# Patient Record
Sex: Female | Born: 1955 | Race: Black or African American | Hispanic: No | Marital: Single | Smoking: Former smoker
Health system: Southern US, Community
[De-identification: ages and names within clinical notes are randomized; demographics above are authoritative.]

## PROBLEM LIST (undated history)

## (undated) DIAGNOSIS — Z940 Kidney transplant status: Secondary | ICD-10-CM

## (undated) DIAGNOSIS — I739 Peripheral vascular disease, unspecified: Secondary | ICD-10-CM

## (undated) DIAGNOSIS — M1990 Unspecified osteoarthritis, unspecified site: Secondary | ICD-10-CM

## (undated) DIAGNOSIS — E785 Hyperlipidemia, unspecified: Secondary | ICD-10-CM

## (undated) DIAGNOSIS — I10 Essential (primary) hypertension: Secondary | ICD-10-CM

## (undated) DIAGNOSIS — K219 Gastro-esophageal reflux disease without esophagitis: Secondary | ICD-10-CM

## (undated) DIAGNOSIS — G40909 Epilepsy, unspecified, not intractable, without status epilepticus: Secondary | ICD-10-CM

## (undated) DIAGNOSIS — R569 Unspecified convulsions: Secondary | ICD-10-CM

## (undated) DIAGNOSIS — I639 Cerebral infarction, unspecified: Secondary | ICD-10-CM

## (undated) DIAGNOSIS — D631 Anemia in chronic kidney disease: Secondary | ICD-10-CM

## (undated) DIAGNOSIS — N189 Chronic kidney disease, unspecified: Secondary | ICD-10-CM

## (undated) HISTORY — DX: Anemia in chronic kidney disease: D63.1

## (undated) HISTORY — PX: CATHETER REMOVAL: SHX911

## (undated) HISTORY — DX: Chronic kidney disease, unspecified: N18.9

## (undated) HISTORY — DX: Epilepsy, unspecified, not intractable, without status epilepticus: G40.909

## (undated) HISTORY — DX: Hyperlipidemia, unspecified: E78.5

## (undated) HISTORY — DX: Unspecified convulsions: R56.9

## (undated) HISTORY — DX: Peripheral vascular disease, unspecified: I73.9

## (undated) HISTORY — PX: COLONOSCOPY: SHX174

## (undated) HISTORY — PX: GALLBLADDER SURGERY: SHX652

## (undated) HISTORY — PX: KIDNEY TRANSPLANT: SHX239

## (undated) HISTORY — DX: Unspecified osteoarthritis, unspecified site: M19.90

## (undated) HISTORY — DX: Gastro-esophageal reflux disease without esophagitis: K21.9

---

## 1997-09-27 ENCOUNTER — Ambulatory Visit (HOSPITAL_COMMUNITY): Admission: RE | Admit: 1997-09-27 | Discharge: 1997-09-27 | Payer: Self-pay | Admitting: Internal Medicine

## 1997-10-14 ENCOUNTER — Ambulatory Visit (HOSPITAL_COMMUNITY): Admission: RE | Admit: 1997-10-14 | Discharge: 1997-10-14 | Payer: Self-pay | Admitting: Internal Medicine

## 1997-10-25 ENCOUNTER — Ambulatory Visit (HOSPITAL_COMMUNITY): Admission: RE | Admit: 1997-10-25 | Discharge: 1997-10-25 | Payer: Self-pay | Admitting: Internal Medicine

## 1998-01-31 ENCOUNTER — Ambulatory Visit (HOSPITAL_COMMUNITY): Admission: RE | Admit: 1998-01-31 | Discharge: 1998-02-01 | Payer: Self-pay | Admitting: General Surgery

## 1998-02-16 ENCOUNTER — Encounter (HOSPITAL_COMMUNITY): Admission: RE | Admit: 1998-02-16 | Discharge: 1998-05-17 | Payer: Self-pay | Admitting: Nephrology

## 1998-02-23 ENCOUNTER — Ambulatory Visit (HOSPITAL_COMMUNITY): Admission: RE | Admit: 1998-02-23 | Discharge: 1998-02-23 | Payer: Self-pay | Admitting: Internal Medicine

## 1998-02-23 ENCOUNTER — Encounter: Payer: Self-pay | Admitting: Internal Medicine

## 1998-06-17 ENCOUNTER — Encounter (HOSPITAL_COMMUNITY): Admission: RE | Admit: 1998-06-17 | Discharge: 1998-09-15 | Payer: Self-pay | Admitting: Nephrology

## 1998-09-28 ENCOUNTER — Encounter (HOSPITAL_COMMUNITY): Admission: RE | Admit: 1998-09-28 | Discharge: 1998-12-27 | Payer: Self-pay | Admitting: Nephrology

## 1998-12-30 ENCOUNTER — Encounter (HOSPITAL_COMMUNITY): Admission: RE | Admit: 1998-12-30 | Discharge: 1999-03-30 | Payer: Self-pay | Admitting: Nephrology

## 1999-05-04 ENCOUNTER — Encounter (HOSPITAL_COMMUNITY): Admission: RE | Admit: 1999-05-04 | Discharge: 1999-08-02 | Payer: Self-pay | Admitting: Nephrology

## 1999-07-31 ENCOUNTER — Encounter (HOSPITAL_COMMUNITY): Admission: RE | Admit: 1999-07-31 | Discharge: 1999-10-29 | Payer: Self-pay | Admitting: Nephrology

## 1999-10-26 ENCOUNTER — Encounter (HOSPITAL_COMMUNITY): Admission: RE | Admit: 1999-10-26 | Discharge: 2000-01-24 | Payer: Self-pay | Admitting: Nephrology

## 1999-11-09 ENCOUNTER — Encounter: Admission: RE | Admit: 1999-11-09 | Discharge: 1999-11-09 | Payer: Self-pay | Admitting: Internal Medicine

## 1999-11-09 ENCOUNTER — Encounter: Payer: Self-pay | Admitting: Internal Medicine

## 2000-01-23 ENCOUNTER — Encounter (HOSPITAL_COMMUNITY): Admission: RE | Admit: 2000-01-23 | Discharge: 2000-04-22 | Payer: Self-pay | Admitting: Nephrology

## 2000-04-26 ENCOUNTER — Emergency Department (HOSPITAL_COMMUNITY): Admission: EM | Admit: 2000-04-26 | Discharge: 2000-04-26 | Payer: Self-pay | Admitting: Emergency Medicine

## 2000-04-29 ENCOUNTER — Encounter (HOSPITAL_COMMUNITY): Admission: RE | Admit: 2000-04-29 | Discharge: 2000-07-28 | Payer: Self-pay | Admitting: Nephrology

## 2000-08-08 ENCOUNTER — Encounter (HOSPITAL_COMMUNITY): Admission: RE | Admit: 2000-08-08 | Discharge: 2000-11-06 | Payer: Self-pay | Admitting: Nephrology

## 2000-11-29 ENCOUNTER — Encounter (HOSPITAL_COMMUNITY): Admission: RE | Admit: 2000-11-29 | Discharge: 2001-02-27 | Payer: Self-pay | Admitting: Nephrology

## 2001-01-13 ENCOUNTER — Encounter: Payer: Self-pay | Admitting: Internal Medicine

## 2001-01-13 ENCOUNTER — Encounter: Admission: RE | Admit: 2001-01-13 | Discharge: 2001-01-13 | Payer: Self-pay | Admitting: Internal Medicine

## 2001-01-15 ENCOUNTER — Encounter: Admission: RE | Admit: 2001-01-15 | Discharge: 2001-01-15 | Payer: Self-pay | Admitting: Internal Medicine

## 2001-01-15 ENCOUNTER — Encounter: Payer: Self-pay | Admitting: Internal Medicine

## 2001-03-07 ENCOUNTER — Encounter (HOSPITAL_COMMUNITY): Admission: RE | Admit: 2001-03-07 | Discharge: 2001-06-05 | Payer: Self-pay | Admitting: Nephrology

## 2001-06-10 ENCOUNTER — Encounter (HOSPITAL_COMMUNITY): Admission: RE | Admit: 2001-06-10 | Discharge: 2001-09-08 | Payer: Self-pay | Admitting: Nephrology

## 2001-09-16 ENCOUNTER — Encounter (HOSPITAL_COMMUNITY): Admission: RE | Admit: 2001-09-16 | Discharge: 2001-12-15 | Payer: Self-pay | Admitting: Nephrology

## 2001-12-19 ENCOUNTER — Encounter (HOSPITAL_COMMUNITY): Admission: RE | Admit: 2001-12-19 | Discharge: 2002-03-19 | Payer: Self-pay | Admitting: Nephrology

## 2001-12-29 ENCOUNTER — Ambulatory Visit (HOSPITAL_COMMUNITY): Admission: RE | Admit: 2001-12-29 | Discharge: 2001-12-29 | Payer: Self-pay | Admitting: General Surgery

## 2001-12-29 ENCOUNTER — Encounter: Payer: Self-pay | Admitting: General Surgery

## 2002-01-01 ENCOUNTER — Ambulatory Visit (HOSPITAL_COMMUNITY): Admission: RE | Admit: 2002-01-01 | Discharge: 2002-01-02 | Payer: Self-pay | Admitting: General Surgery

## 2002-01-01 ENCOUNTER — Encounter: Payer: Self-pay | Admitting: General Surgery

## 2002-08-06 ENCOUNTER — Encounter: Payer: Self-pay | Admitting: Internal Medicine

## 2002-08-06 ENCOUNTER — Encounter: Admission: RE | Admit: 2002-08-06 | Discharge: 2002-08-06 | Payer: Self-pay | Admitting: Internal Medicine

## 2003-11-30 ENCOUNTER — Encounter: Admission: RE | Admit: 2003-11-30 | Discharge: 2003-11-30 | Payer: Self-pay | Admitting: Internal Medicine

## 2004-12-06 ENCOUNTER — Encounter: Admission: RE | Admit: 2004-12-06 | Discharge: 2004-12-06 | Payer: Self-pay | Admitting: Internal Medicine

## 2005-07-02 ENCOUNTER — Inpatient Hospital Stay (HOSPITAL_COMMUNITY): Admission: AD | Admit: 2005-07-02 | Discharge: 2005-07-05 | Payer: Self-pay | Admitting: Nephrology

## 2005-07-25 ENCOUNTER — Encounter (HOSPITAL_COMMUNITY): Admission: RE | Admit: 2005-07-25 | Discharge: 2005-10-23 | Payer: Self-pay | Admitting: Family Medicine

## 2005-11-13 ENCOUNTER — Encounter (HOSPITAL_COMMUNITY): Admission: RE | Admit: 2005-11-13 | Discharge: 2005-12-19 | Payer: Self-pay | Admitting: Nephrology

## 2006-01-01 ENCOUNTER — Ambulatory Visit (HOSPITAL_COMMUNITY): Admission: RE | Admit: 2006-01-01 | Discharge: 2006-01-01 | Payer: Self-pay | Admitting: General Surgery

## 2006-01-08 ENCOUNTER — Encounter (HOSPITAL_COMMUNITY): Admission: RE | Admit: 2006-01-08 | Discharge: 2006-04-08 | Payer: Self-pay | Admitting: Nephrology

## 2006-01-16 ENCOUNTER — Encounter: Admission: RE | Admit: 2006-01-16 | Discharge: 2006-01-16 | Payer: Self-pay | Admitting: Internal Medicine

## 2006-04-19 ENCOUNTER — Encounter: Admission: RE | Admit: 2006-04-19 | Discharge: 2006-04-19 | Payer: Self-pay | Admitting: Internal Medicine

## 2006-10-27 ENCOUNTER — Emergency Department (HOSPITAL_COMMUNITY): Admission: EM | Admit: 2006-10-27 | Discharge: 2006-10-27 | Payer: Self-pay | Admitting: Emergency Medicine

## 2006-11-10 ENCOUNTER — Inpatient Hospital Stay (HOSPITAL_COMMUNITY): Admission: EM | Admit: 2006-11-10 | Discharge: 2006-11-14 | Payer: Self-pay | Admitting: Emergency Medicine

## 2006-11-29 ENCOUNTER — Encounter (INDEPENDENT_AMBULATORY_CARE_PROVIDER_SITE_OTHER): Payer: Self-pay | Admitting: Nephrology

## 2006-11-29 ENCOUNTER — Ambulatory Visit: Payer: Self-pay | Admitting: Cardiovascular Disease

## 2006-11-29 ENCOUNTER — Inpatient Hospital Stay (HOSPITAL_COMMUNITY): Admission: EM | Admit: 2006-11-29 | Discharge: 2006-12-02 | Payer: Self-pay | Admitting: Emergency Medicine

## 2007-01-08 ENCOUNTER — Ambulatory Visit: Payer: Self-pay | Admitting: Vascular Surgery

## 2007-01-24 ENCOUNTER — Ambulatory Visit (HOSPITAL_COMMUNITY): Admission: RE | Admit: 2007-01-24 | Discharge: 2007-01-24 | Payer: Self-pay | Admitting: Vascular Surgery

## 2007-01-24 ENCOUNTER — Ambulatory Visit: Payer: Self-pay | Admitting: Vascular Surgery

## 2007-02-06 ENCOUNTER — Encounter: Admission: RE | Admit: 2007-02-06 | Discharge: 2007-02-06 | Payer: Self-pay | Admitting: Internal Medicine

## 2007-03-04 ENCOUNTER — Ambulatory Visit: Payer: Self-pay | Admitting: Vascular Surgery

## 2007-05-23 ENCOUNTER — Ambulatory Visit: Payer: Self-pay | Admitting: Vascular Surgery

## 2007-05-23 ENCOUNTER — Emergency Department (HOSPITAL_COMMUNITY): Admission: EM | Admit: 2007-05-23 | Discharge: 2007-05-23 | Payer: Self-pay | Admitting: Emergency Medicine

## 2007-06-03 ENCOUNTER — Ambulatory Visit (HOSPITAL_COMMUNITY): Admission: RE | Admit: 2007-06-03 | Discharge: 2007-06-03 | Payer: Self-pay | Admitting: Nephrology

## 2007-06-26 ENCOUNTER — Ambulatory Visit (HOSPITAL_COMMUNITY): Admission: RE | Admit: 2007-06-26 | Discharge: 2007-06-26 | Payer: Self-pay | Admitting: Nephrology

## 2008-02-10 ENCOUNTER — Encounter: Admission: RE | Admit: 2008-02-10 | Discharge: 2008-02-10 | Payer: Self-pay | Admitting: Internal Medicine

## 2008-03-11 ENCOUNTER — Ambulatory Visit (HOSPITAL_COMMUNITY): Admission: RE | Admit: 2008-03-11 | Discharge: 2008-03-11 | Payer: Self-pay | Admitting: Nephrology

## 2008-12-03 ENCOUNTER — Emergency Department (HOSPITAL_COMMUNITY): Admission: EM | Admit: 2008-12-03 | Discharge: 2008-12-03 | Payer: Self-pay | Admitting: Emergency Medicine

## 2008-12-15 ENCOUNTER — Encounter (INDEPENDENT_AMBULATORY_CARE_PROVIDER_SITE_OTHER): Payer: Self-pay | Admitting: Internal Medicine

## 2008-12-15 ENCOUNTER — Ambulatory Visit (HOSPITAL_COMMUNITY): Admission: RE | Admit: 2008-12-15 | Discharge: 2008-12-15 | Payer: Self-pay | Admitting: Internal Medicine

## 2008-12-15 ENCOUNTER — Ambulatory Visit: Payer: Self-pay | Admitting: Vascular Surgery

## 2008-12-29 ENCOUNTER — Ambulatory Visit: Admission: RE | Admit: 2008-12-29 | Discharge: 2008-12-29 | Payer: Self-pay | Admitting: Unknown Physician Specialty

## 2008-12-29 ENCOUNTER — Encounter (INDEPENDENT_AMBULATORY_CARE_PROVIDER_SITE_OTHER): Payer: Self-pay | Admitting: Sports Medicine

## 2009-02-10 ENCOUNTER — Encounter: Admission: RE | Admit: 2009-02-10 | Discharge: 2009-02-10 | Payer: Self-pay | Admitting: Internal Medicine

## 2009-02-15 ENCOUNTER — Encounter: Admission: RE | Admit: 2009-02-15 | Discharge: 2009-02-15 | Payer: Self-pay | Admitting: Nephrology

## 2009-08-26 ENCOUNTER — Encounter (HOSPITAL_COMMUNITY): Admission: RE | Admit: 2009-08-26 | Discharge: 2009-11-09 | Payer: Self-pay | Admitting: Nephrology

## 2010-02-28 ENCOUNTER — Encounter: Admission: RE | Admit: 2010-02-28 | Discharge: 2010-02-28 | Payer: Self-pay | Admitting: Internal Medicine

## 2010-06-19 LAB — POCT HEMOGLOBIN-HEMACUE: Hemoglobin: 11.8 g/dL — ABNORMAL LOW (ref 12.0–15.0)

## 2010-06-19 LAB — TACROLIMUS LEVEL: Tacrolimus (FK506) - LabCorp: 19.3 ng/mL

## 2010-06-19 LAB — CBC
MCHC: 33.1 g/dL (ref 30.0–36.0)
RBC: 4.02 MIL/uL (ref 3.87–5.11)
WBC: 4.6 10*3/uL (ref 4.0–10.5)

## 2010-06-19 LAB — DIFFERENTIAL
Basophils Relative: 0 % (ref 0–1)
Eosinophils Absolute: 0 10*3/uL (ref 0.0–0.7)
Lymphs Abs: 0.2 10*3/uL — ABNORMAL LOW (ref 0.7–4.0)
Monocytes Absolute: 0.6 10*3/uL (ref 0.1–1.0)
Monocytes Relative: 12 % (ref 3–12)
Neutrophils Relative %: 82 % — ABNORMAL HIGH (ref 43–77)

## 2010-07-07 LAB — POCT I-STAT, CHEM 8
BUN: 5 mg/dL — ABNORMAL LOW (ref 6–23)
Chloride: 96 mEq/L (ref 96–112)
HCT: 39 % (ref 36.0–46.0)
Potassium: 3.5 mEq/L (ref 3.5–5.1)

## 2010-07-07 LAB — DIFFERENTIAL
Basophils Relative: 0 % (ref 0–1)
Lymphocytes Relative: 13 % (ref 12–46)
Lymphs Abs: 0.8 10*3/uL (ref 0.7–4.0)
Monocytes Relative: 6 % (ref 3–12)
Neutro Abs: 5 10*3/uL (ref 1.7–7.7)

## 2010-07-07 LAB — CBC
HCT: 34.8 % — ABNORMAL LOW (ref 36.0–46.0)
Hemoglobin: 11.6 g/dL — ABNORMAL LOW (ref 12.0–15.0)
MCV: 110.5 fL — ABNORMAL HIGH (ref 78.0–100.0)
RDW: 28.5 % — ABNORMAL HIGH (ref 11.5–15.5)

## 2010-07-07 LAB — GLUCOSE, CAPILLARY: Glucose-Capillary: 93 mg/dL (ref 70–99)

## 2010-07-07 LAB — PROTIME-INR: INR: 1.2 (ref 0.00–1.49)

## 2010-07-07 LAB — APTT: aPTT: 26 seconds (ref 24–37)

## 2010-08-15 NOTE — Consult Note (Signed)
Karen [name], Karen [name]             ACCOUNT NO.:  [id number]   MEDICAL RECORD NO.:  [id number]          PATIENT TYPE:  INP   LOCATION:  5528                         FACILITY:  [facility]   PHYSICIAN:  Fenton Malling. Lucia Gaskins, M.D.  DATE OF BIRTH:  1956/03/31   DATE OF CONSULTATION:  DATE OF DISCHARGE:                                 CONSULTATION   REASON FOR CONSULTATION:  Infected peritoneal dialysis catheter.   HISTORY OF ILLNESS:  Ms. Arduini is a 55 year old black female who is a  patient of the renal service and has been followed by Dr. Jonnie Finner this  weekend, who had end-stage renal disease first diagnosed in 2003.  She  then had a kidney transplant at [facility]  in 2005.  The kidney failed in 2007.  And, in October 2007, Dr. Milana Kidney  placed a peritoneal dialysis catheter which she has done pretty well  with.  However, she developed an infection with her catheter identified as Gram  negative organisms.  She has been treated for over 2 weeks with  antibiotics and now it is felt that she would be best served by having  this peritoneal dialysis catheter removed.   PAST MEDICAL HISTORY:  Significant that she has:  1. Gout.  2. Chronic anemia.  3. Hypertension.  4. Secondary hyperparathyroidism.   CURRENT MEDICATIONS:  Mentioned on admission including:  1. Septra-DS 1/2 tablet daily.  2. Toprol XL 50 mg daily.  3. Lisinopril 10 mg daily.  4. Prednisone 2 mg daily.  5. Magnesium.  6. Hectorol Monday, Wednesday and Friday.  7. Epogen.  8. Calcitriol.  9. Felodipine 10 mg daily.  10.Prevacid 20 mg daily.  11.Dulcolax.  12.Fortaz.   PHYSICAL EXAMINATION:  GENERAL:  She is a pleasant black female.  Her  daughter is in the room with her.  VITAL SIGNS:  Her temperature is 98.9, pulse 113, blood pressure 119/74.  NECK:  Supple.  LUNGS:  Clear to auscultation.  HEART:  Regular rate and rhythm without murmur or rub.  ABDOMEN:  Mildly distended.  She has a PD  catheter coming out through a  right mid abdominal incision.  She is bloated, somewhat tender.  They  are doing the exchanges with PD dialysate right now.  EXTREMITIES:  Have good strength in all 4 extremities.  NEUROLOGIC:  Grossly intact.   LABORATORY:  Show a white blood cell count of 13,200, hemoglobin of 11,  hematocrit 34.  Her sodium is 128, potassium 2.7, chloride of 89,  glucose of 96, BUN of 29, creatinine of 9.68.  Her albumin is 1.1,  calcium 7.3, phosphorous 47.   IMPRESSION:  1. 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  I discussed with the patient and her daughter about proceeding with  surgery to remove the PD catheter.  That the best treatment would be  removal of this catheter.  I talked about general anesthesia, the risks  of surgery, the risks of bleeding, infection, and wound healing.  She  has very poor nutrition and is on steroids and these both  will combine  to limit wound healing.  1. End-stage renal disease.  2. Hypokalemia.  3. Hypertension.  4. Secondary hyperparathyroidism.  5. Severe malnutrition with a serum albumin of 1.1 and a total protein      of 6.2.   Of note at the time of this dictation, the OR has seven cases in front  of Ms. Quillin.  If this goes late into the evening, I may not be able  to do her surgery today.  Dr. Kathrin Penner who is our doctor of the  week, I will then talk to him tomorrow about doing her surgery.      Fenton Malling. Lucia Gaskins, M.D.  Electronically Signed     DHN/MEDQ  D:  11/10/2006  T:  11/10/2006  Job:  [id number]   cc:   Sol Blazing, M.D.

## 2010-08-15 NOTE — Op Note (Signed)
NAMEMADISEN, ZDROJEWSKI             ACCOUNT NO.:  [id number]   MEDICAL RECORD NO.:  [id number]          PATIENT TYPE:  AMB   LOCATION:  SDS                          FACILITY:  [facility]   PHYSICIAN:  Nelda Severe. Kellie Simmering, M.D.  DATE OF BIRTH:  Jun 06, 1955   DATE OF PROCEDURE:  01/24/2007  DATE OF DISCHARGE:  01/24/2007                               OPERATIVE REPORT   PREOPERATIVE DIAGNOSIS:  End-stage renal disease.   POSTOPERATIVE DIAGNOSIS:  End-stage renal disease.   OPERATION:  Creation of a right upper arm brachial-artery-to-cephalic-  vein arteriovenous fistula (Kauffman shunt).   SURGEON:  Nelda Severe. Kellie Simmering, M.D.   FIRST ASSISTANT:  Nurse.   ANESTHESIA:  Local.   PROCEDURE:  The patient was taken to the operating room and placed in a  supine position, at which time the right upper extremity was prepped  with Betadine scrubbing solution and draped in a routine sterile manner.  After infiltration with 1% Xylocaine with epinephrine, a transverse  incision was made in the right antecubital area and antecubital vein  dissected free.  The basilic branch was relatively small.  Cephalic  branch was 3 mm in size; it was dissected free, its branches ligated  with 4-0 silk ties and divided, transected distally and could be dilated  up to the shoulder area and seemed to be of adequate size for a fistula.  Brachial artery was exposed beneath the fascia and encircled with  Vesseloops.  No heparin was given.  The artery was occluded proximally  and distally with Vesseloops, opened with a 15 blade and extended with  the Potts scissors.  Vein was carefully measured and spatulated and  anastomosed end-to-side with 6-0 Prolene.  Vesseloops were then released  and there was a good pulse and thrill in the fistula with good audible  Doppler flow up to the shoulder.  No protamine was given.  No heparin  was given.  The wound was irrigated with saline and closed in layers  with Vicryl in a subcuticular  fashion and sterile dressing applied.  The  patient was taken to the recovery in satisfactory condition.      Nelda Severe Kellie Simmering, M.D.  Electronically Signed     JDL/MEDQ  D:  01/24/2007  T:  01/26/2007  Job:  [id number]

## 2010-08-15 NOTE — H&P (Signed)
Karen [name], STIVER NO.:  [id number]   MEDICAL RECORD NO.:  [id number]          PATIENT TYPE:  INP   LOCATION:  5528                         FACILITY:  [facility]   PHYSICIAN:  Sol Blazing, M.D.DATE OF BIRTH:  November 07, 1955   DATE OF ADMISSION:  11/09/2006  DATE OF DISCHARGE:                              HISTORY & PHYSICAL   CHIEF COMPLAINT:  Abdominal pain.   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  The patient has been on intraperitoneal antibiotics since July  27, for Alcaligenes peritonitis with Fortaz 1500 mg daily in her  intraperitoneal dialysis bag with a time of 6 hours.  The patient has  failed to show significant improvement after almost 2 weeks of this  therapy.  She continues to have progressively increasing abdominal pain.  The patient was started on oral Septra on Monday for her continued pain  with double strength tablets one-half tablet daily, but has failed to  show any improvement again.  In addition to the patient's progressive  increase in abdominal pain, she is also experiencing generalized fatigue  and weakness, however, denies fever or chills.   REVIEW OF SYSTEMS:  CONSTITUTIONAL:  Denies fever or chills.  Does  report fatigue and decreased appetite.  HEENT:  Denies headache, runny  nose, sore throat.  SKIN:  Denies rash or lesions.  CARDIOVASCULAR:  Denies chest pain.  PULMONARY:  Denies shortness of breath, cough or  wheezing.  ENDOCRINE:  Denies polyuria or polydipsia.  GENITOURINARY:  Denies dysuria or hematuria.  Does report decreased urine output times 2  weeks.  NEUROLOGIC:  Denies numbness or tingling.  MUSCULOSKELETAL:  Denies joint swelling or pain.  GASTROINTESTINAL:  Denies  nausea,  diarrhea, bloody stools, melanotic stools or hematemesis.  Does report  occasional nonbloody, nonbilious emesis with none today.  Reports  exquisite abdominal pain as well as intermittent constipation.   PAST MEDICAL HISTORY:  1. End-stage renal disease secondary to hypertension.  First diagnosed      in October 2003, and initiated on peritoneal dialysis.  2. Status post renal transplant at [facility] in September 2005, which was      eventually rejected and thus, the patient restarted peritoneal      dialysis in October 2007.  3. History of gout in great toe, only one episode.  4. Anemia.  5. Hypertension.  6. Secondary hyperparathyroidism.   ALLERGIES:  No known drug allergies.   MEDICATIONS:  1. Septra double strength tablet one-half tablet p.o. daily.  2. Toprol XL 50 mg p.o. daily.  3. Lisinopril 10 mg p.o. daily.  4. Prednisone 2 mg p.o. daily.  5. Magnesium (unknown dose).  6. Hectorol (unknown dose) every Monday, Wednesday and Friday.  7. Epogen (unknown dose) every Monday.  8. Calcitriol (unknown dose).  9. Felodipine 10 mg p.o. daily.  10.Prevacid 20 mg p.o. daily.  11.Dulcolax as needed.  12.Fortaz 1500 mg IP daily since October 27, 2006.   SOCIAL HISTORY:  The patient lives in [place] with her mother.  She  has one daughter.  She denies tobacco, alcohol or drug use.   FAMILY HISTORY:  Significant for kidney disease, coronary artery disease  and hypertension.   PHYSICAL EXAMINATION:  VITAL SIGNS:  Temperature is 99, pulse of 118-  133, respiratory rate 18-32, blood pressure 112-126/76-85, oxygen  saturation 96-97% on room air.  GENERAL:  The patient is awake, alert and oriented x3 in no acute  distress, but visibly in pain.  HEENT:  Head is normocephalic and atraumatic.  Extraocular movements are  intact.  Pupils are equal, round, reactive to light and accommodation.  Sclerae are clear bilaterally.  Nares are without discharge.  The  patient has dry mucous  membranes.  Oropharynx is without erythema or  exudates.  CARDIOVASCULAR:  Heart has regular rate and rhythm with normal S1, S2  and 2/6 systolic ejection murmur heard at left sternal border.  Dorsalis  pedis pulses are 2+ and equal bilaterally.  LUNGS:  Clear to auscultation bilaterally without wheezes, rales or  rhonchi.  SKIN:  No rash or lesions.  Positive skin tenting.  ABDOMEN:  Soft with normoactive bowel sounds and exquisitely tender to  light palpation with voluntary guarding.  EXTREMITIES:  No clubbing, cyanosis or edema.  ACCESS SITE:  The patient's peroneal dialysis catheter site in the right  abdomen is without erythema or discharge.  MUSCULOSKELETAL:  No joint deformity or effusions.  NEUROLOGICAL:  The patient is alert and oriented x3.  Cranial nerves 2-  12 grossly intact.  DTRs are symmetric and normal bilaterally.  The  patient has 5-/5 strength bilaterally.   LABORATORY DATA AND X-RAY FINDINGS:  CBC reveals a white blood cell  count of 15.3, hemoglobin of 12.5, platelets 463, 88% neutrophils and  absolute neutrophil count of 13.5.  Complete metabolic panel reveals a  sodium of 128, potassium 4, bicarb of 30, BUN of 27, creatinine and  9.58, glucose of 98, calcium 8.0, albumin of 1.4.  Total bilirubin of  1.7, alkaline phosphatase 65, AST 28, ALT of less than 8.   ASSESSMENT/PLAN:  This is 55 year old, African American female with end-  stage renal disease on peritoneal dialysis secondary to hypertension,  status post rejection of kidney transplant in October 2007, who presents  with increasing abdominal pain, known peritonitis secondary to  Alcaligenes and failing approximately 2 weeks of outpatient antibiotics.   1. Peritonitis.  The patient has peritoneal cultures from October 27, 2006, which were positive for Alcaligenes sensitive to [place] of      which the patient has been receiving intraperitoneal Tressie Ellis for      almost 2 weeks now, but with worsening  abdominal pain, despite the      recent addition of oral Bactrim as well.  We will place the patient      on a double antibiotic regimen with continued intraperitoneal      Fortaz as well as the addition of IV Zosyn.  Will send peritoneal      dialysis fluid for cell count, Gram stain and culture.  Given the      patient's failure to improve, will also call surgery in the morning      regarding possibility of catheter removal.  Will give morphine as      needed for pain control.  Will keep the patient n.p.o. overnight  for the surgeon's in the morning.  Will give normal saline at 75      mL/hour overnight as well as the patient appears mildly dry and is      not taking adequate oral intake at present.  2. End-stage renal disease on peritoneal dialysis.  Will place the      patient on continuous ambulatory peritoneal dialysis overnight and      change to continuous cycling peritoneal dialysis in the morning.      [place] on-call nurse for the patient's      regimen tonight and will request records from the center on Monday.  3. Anemia.  The patient's hemoglobin is currently stable.  Will obtain      Epogen dose from [facility] on Monday.  4. Secondary hyperparathyroidism.  Will restart calcitriol and      Hectorol when the patient is able to take oral medications and when      doses are obtained from [facility].  5. Hypertension.  The patient's blood pressures are currently on the      low side and the patient reports low blood pressures at home as      well.  Will hold home medications for now and give intravenous      fluids overnight as above as the patient appears dry.  6. Nutrition:  The patient is currently n.p.o. overnight until surgery      sees her in the morning.  Will give intravenous fluids of 75      mL/hour for intravenous fluid hydration.  7. Prophylaxis.  Will place stocking compression devices to lower       extremities bilaterally and continue the patient's home proton pump      inhibitor.   DISPOSITION:  Pending on improvement of abdominal pain and surgery  consult for possible removal of peritoneal dialysis catheter.      Shella Maxim, M.D.  Electronically Signed      Sol Blazing, M.D.  Electronically Signed    EE/MEDQ  D:  11/10/2006  T:  11/11/2006  Job:  [id number]

## 2010-08-15 NOTE — Assessment & Plan Note (Signed)
OFFICE VISIT   [name], Karen  DOB:  1955/12/06                                       03/04/2007  CHART#:[id number]   HISTORY:  The patient had an AV fistula created by me in the right upper  arm on October 24 for end-stage renal disease.  The fistula is  functioning nicely with an excellent pulse and palpable thrill and no  steal symptoms in the right upper extremity.  The incision has healed  nicely.  There is a 1+ radial pulse palpable distally with no numbness  or pain.  This should function nicely as a fistula and I would prefer to  wait until late January before utilizing this to allow maximum healing.  She will return to see [place] on a p.r.n. basis.   Nelda Severe Kellie Simmering, M.D.  Electronically Signed   JDL/MEDQ  D:  03/04/2007  T:  03/05/2007  Job:  [id number]   cc:   Maudie Flakes. Hassell Done, M.D.

## 2010-08-15 NOTE — Procedures (Signed)
EEG NUMBER:  [id number]   HISTORY:  This is a 55 year old patient with possible stroke event, who  is being evaluated for seizure spells.  This is a portable EEG  recording.  No skull defects noted.   EEG CLASSIFICATION:  Essentially normal, awake and asleep.   DESCRIPTION OF RECORDING:  The background rhythm of this recording  consists of a moderately well-modulated medium amplitude alpha rhythm of  8 Hz that is reactive to eye opening and closure.  As record progresses,  the patient initially appears to be in the drowsy state with some  bilaterally symmetric slowing seen.  At times, vertex sharp wave  activity seen along with more generalized slowing.  Occasionally, the  delta frequency range is seen.  The patient is alerted off and on during  the recording, with return of good background activity that is symmetric  and reactive.  Photic stimulation hyperventilation was not performed.  At no time during the recording does there appear to evidence of spike,  spike wave discharges, or evidence of focal slowing.  EKG monitor shows  no evidence of cardiac rhythm abnormalities, with a heart rate of 78.   IMPRESSION:  This is an essentially normal EEG recording, awake and  sleeping state.  No evidence of ictal or interictal discharges were  seen.      Jill Alexanders, M.D.  Electronically Signed     [id number]  D:  11/30/2006 18:14:29  T:  12/01/2006 18:58:33  Job #:  [id number]

## 2010-08-15 NOTE — Discharge Summary (Signed)
Karen [name], Karen [name]             ACCOUNT NO.:  [id number]   MEDICAL RECORD NO.:  [id number]          PATIENT TYPE:  INP   LOCATION:  5528                         FACILITY:  [facility]   PHYSICIAN:  Shella Maxim, M.D.       DATE OF BIRTH:  Jun 11, 1955   DATE OF ADMISSION:  11/10/2006  DATE OF DISCHARGE:  11/14/2006                               DISCHARGE SUMMARY   REASON FOR ADMISSION:  Peritonitis secondary to Alkaligenes failing  outpatient intraperitoneal antibiotics.   DISCHARGE DIAGNOSES:  1. Peritonitis secondary to Alkaligenes, failing 2 weeks of outpatient      intraperitoneal antibiotics.  2. Status post perineal dialysis catheter removal on November 10, 2006.  3. Status post right PermCath placement in November 12, 2006.  4. Status post initiation of hemodialysis on November 13, 2006.  5. End-stage renal disease secondary to hypertension since October of      2003 status post failed renal transplant from 2005-2007.  6. Anemia.  7. Hypertension.  8. Secondary hyperparathyroidism.   DISCHARGE MEDICATIONS:  1. Prednisone 1 mg p.o. daily (on taper).  2. Ambien 5 mg p.o. nightly as needed for sleep.  3. Os-Cal 500 mg p.o. t.i.d. with meals.  4. Pepcid 20 mg p.o. b.i.d.  5. The patient is to stop taking lisinopril and Toprol at the time of      discharge due to low blood pressures.   PERTINENT LABORATORY DATA:  Hemoglobin at time of discharge 11.4.  potassium 2.3 prior to hemodialysis on August 13.  Phosphorus 6.3.  PT  17.9, INR 1.4, calcium 7.9, albumin 1.3.  Repeat body fluid cultures  obtained on November 10, 2006 showed 1 out of 2 cultures positive for rare  Alkaligenes.  This is consistent with prior peritoneal cultures obtained  on October 27, 2006.   BRIEF HOSPITAL COURSE:  1. This is a 55 year old African American female with end-stage renal      disease secondary to hypertension on peritoneal dialysis since      October of 2003 status post failed renal transplant from  2005-2007      who presents with first episode of peritonitis since reinitiating      perineal dialysis in October of 2007-patient was admitted with      fevers and increasing abdominal pain after failing 2 weeks of      outpatient intraperitoneal Fortaz.  The patient had fevers and      significant abdominal pain on admission.  Physical exam.  Surgery      was consulted on the day of admission for removal of peritoneal      dialysis catheter.  The patient was initially placed on broad-      spectrum antibiotics with intraperitoneal Fortaz and Zosyn.  Both      of these were sensitive to positive cultures from October 27, 2006.      Patient rapidly improved after removal of peritoneal dialysis      catheter.  Patient was continued on IV Fortaz once initiating      hemodialysis at which point Zosyn was DC'd.  The patient had  PermCath placement on November 12, 2006 for hemodialysis and received      her first treatment of hemodialysis on November 13, 2006.  The      patient will continue Tressie Ellis for initial 2 doses after discharge.  2. End-stage renal disease secondary to hypertension, now on      hemodialysis:  The patient will continue temporary hemodialysis for      several months until her surgeons are willing to put in a new      peritoneal dialysis catheter.  3. Anemia:  Hemoglobin remained relatively stable throughout      admission.  The patient was continued on Epogen which was changed      to 10,000 units IV q. hemodialysis.  4. Hypertension:  The patient's lisinopril and Toprol were initially      held for low blood pressures and concern for possible sepsis.  The      patient's blood pressures remained low throughout hospitalization,      particularly after initiating hemodialysis.  Thus, her lisinopril      and Toprol were held at the time of discharge.  Would recommend      continued following in the outpatient setting.  5. Secondary hyperparathyroidism:  The patient was  continued on Os-Cal      as well as initiated on Zemplar 1 mcg IV q. Hemodialysis.   HEMODIALYSIS ORDERS AT THE TIME OF DISCHARGE:  __________ potassium bath  duration 3 hours and 15 minutes.  Estimated dry weight 63.0 kg.  Accesses, right Diatek catheter.  Blood __________  was started at 300  and to be increased by 50 each hemodialysis to 400.  Heparin was  __________ times 1 week then standard.  Epogen 10,000 units IV q.  hemodialysis __________  Zemplar 1 mcg IV q. hemodialysis.  The  __________  is negative on August 13.  Antibiotics Fortaz 2 grams q.  hemodialysis x2 doses.   DISCHARGE INSTRUCTIONS:  The patient is to follow a renal diet and  increase activity as tolerated.  The patient is to continue outpatient  hemodialysis at [facility] on Mondays, Wednesdays  and Fridays.   FOLLOW-UP APPOINTMENTS:  The patient has a scheduled follow-up  appointment [facility] in 1-2 moths regarding new  peritoneal dialysis catheter placement.   The patient was discharged home in stable medical condition.      Shella Maxim, M.D.  Electronically Signed     EE/MEDQ  D:  11/27/2006  T:  11/28/2006  Job:  [id number]

## 2010-08-15 NOTE — H&P (Signed)
Karen, [name] NO.:  [id number]   MEDICAL RECORD NO.:  [id number]          PATIENT TYPE:  INP   LOCATION:  3025                         FACILITY:  [facility]   PHYSICIAN:  Katha Cabal, M.D.     DATE OF BIRTH:  [birth year]   DATE OF ADMISSION:  11/29/2006  DATE OF DISCHARGE:                              HISTORY & PHYSICAL   CHIEF COMPLAINT:  Right lower extremity weakness/stumbling to the right  side while walking.   HISTORY OF PRESENT ILLNESS:  The patient is a 55 year old African  American woman with past medical history significant for end-stage renal  disease, which was diagnosed in October 2003, status post failed kidney  transplant, which was done at [facility] in September 2005, with a history of  peritoneal dialysis, which has been replaced by hemodialysis,  hypertension, and peritonitis, positive for alcaligenes, recently  discharged from the hospital, who presented to [facility] Emergency  Department secondary to right lower extremity weakness, and stumbling to  the right when ambulating. Upon arrival, the patient had a seizure  without bowel or urinary incontinence, or tongue-biting.  The patient  did report some palpitations prior to weakness.  This episode of  weakness started at approximately 3 a.m. on the morning of admission.  The patient denied prior similar episodes of weakness or seizures.  No  other complaints.  The patient is a hemodialysis patient at [facility] for Monday, Wednesday, Friday dialysis.   PAST MEDICAL HISTORY:  1. End-stage renal disease diagnosed in October 2003, started on      peritoneal dialysis at that time.  2. Status post kidney transplant at [facility] in September 2005, which      subsequently failed.  The patient was then restarted on peritoneal      dialysis in October 2007.  3. Peritonitis diagnosed in July 2007, 2008 positive for alcaligenes.  4. The patient is now on hemodialysis with a Perm-Cath  placed on      November 12, 2006.  5. Gout.  6. Anemia.  7. Hypertension.  8. Secondary hyperparathyroidism.  9. Peritoneal catheter removal November 05, 2006.   PAST SURGICAL HISTORY:  Please see above.   ALLERGIES:  No known drug allergies.   SOCIAL HISTORY:  The patient denies tobacco, alcohol, illicit drugs.  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   DISCHARGE MEDICATIONS:  At discharge on November 27, 2006 include:  1. Prednisone 1 mg p.o. daily to be continued through December 15, 2006.  2. Ambien 5 mg p.o. each bedtime p.r.n.  3. Os-Cal 500 mg p.o. q.i.d. before meals.  4. Pepcid 20 mg p.o. b.i.d.  5. The patient was initially kept off her Toprol XL and lisinopril at      the time of discharge, but it has been restarted in the outpatient      setting with dosages of Toprol XL 100 mg p.o. daily, and lisinopril      20 mg p.o. daily.  6. Epogen 10,000 units each hemodialysis.  7. Zemplar 4 mcg intravenously with each hemodialysis.   PHYSICAL EXAMINATION:  VITAL SIGNS:  Temperature 97.4, pulse 81, blood  pressure 164/86, respiratory rate 20, O2 saturation equals 100% on 2  liters.  GENERAL:  No acute distress.  Resting comfortably.  HEENT:  Normocephalic, atraumatic.  Moist oral mucosa, no erythema.  CARDIOVASCULAR:  S1 and S2.  Regular rate and rhythm.  No murmurs, rubs,  or gallops.  LUNGS:  Clear to auscultation bilaterally.  ABDOMEN:  Soft, nontender, with positive bowel sounds.  NEUROLOGIC:  Cranial nerves II-XII are grossly intact (the patient has  some right visual defects), right lower extremity weakness, right and  left upper extremities normal in terms of motor and sensation.  Left  lower extremity is normal in terms of motor and sensation as well.  The  patient does have decreased proprioception in the right lower extremity.  Cerebellar appears intact.  EXTREMITIES:   No edema.   LABORATORY DATA:  WBC 10.2, hemoglobin 12.5, hematocrit 38.6, platelets  273, ANC 4.7.  I-Stat 8, pH equals 7.365, pCO2 equals 47.6, bicarbonate  equals 27.1,  pCO2 equals 29, hemoglobin 12.6,hematocrit 46.0, sodium  137, potassium 3.4, chloride 102, bicarbonate 27, BUN 12, glucose 102,  anion gap 8, magnesium equals 1.7.  Fasting lipid panel was done, with  cholesterol 185, triglycerides 151, ACL 43, LDL 112, BLDL equals 30,  hepatitis B surface antigen was negative on November 13, 2006.  Chest x-  ray showed right lower lobe atelectasis or early pneumonia with small  right effusion, minimal left base atelectasis.  CT of the head with no  definite acute intracranial abnormality.  Patient motion, somewhat  limited study.   ASSESSMENT AND PLAN:  1. Seizure:  Etiology unclear at this point with differential      including cerebrovascular accident, transient ischemic attack, et      Ronney Asters.  Neurology is managing this, greatly appreciate assistance.      The patient was given Dilantin and has Ativan orders.  CT of the      head was negative.  MRI/MRA is pending.  Will check carotid      Dopplers, check 2D echocardiogram, urine drug screen, coagulation,      EEG, homocysteine, and swallow evaluation.  I will hold Toprol XL,      and lisinopril for now. Will also check a UA, CMET and urine      culture.  2. End-stage renal disease:  The patient is to get hemodialysis today      to keep her on Monday, Wednesday, Friday schedule.  Will check pre      hemodialysis renal panel and CBC.  Continue Os-Cal, Epogen,      Zemplar.  3. Hypertension:  Will hold Toprol and lisinopril for now.  4. Diet:  Will put patient on renal diet if she passes her swallowing      evaluation.  5. Hypokalemia. Will repeat hemodialysis with a full potassium bath.  6. Magnesium near lower limits of normal at 1.7.  Will give 2 g      magnesium sulfate over 2 hours.  7. Prophylaxis with Protonix/ sequential  compression devices.  8. Questionable pneumonia on chest x-ray.  The patient has a normal      WBC, is afebrile, and is without complaints of cough.  Will hold      off on antibiotics for now.  Will discuss with attending.  Katha Cabal, M.D.  Electronically Signed     JY/MEDQ  D:  11/29/2006  T:  11/30/2006  Job:  [id number]

## 2010-08-15 NOTE — Procedures (Signed)
CEPHALIC VEIN MAPPING   INDICATION:  Cephalic vein mapping prior to placement of dialysis  access.   HISTORY:  The patient currently dialyzes via a right subclavian catheter.   EXAM:  The right cephalic vein is compressible.   Diameter measurements range from 0.16 to 0.36 cm.   The left cephalic vein is compressible.   Diameter measurements range from 0.22 to 0.16 cm.   See attached worksheet for all measurements.   IMPRESSION:  Patent bilateral cephalic veins.   The right cephalic is of acceptable diameter for use as an AV fistula to  the level of the mid forearm.   The left cephalic vein is too small to use as part of an AV fistula.   ___________________________________________  Jessy Oto. Fields, MD   MC/MEDQ  D:  01/08/2007  T:  01/09/2007  Job:  [id number]

## 2010-08-15 NOTE — Assessment & Plan Note (Signed)
OFFICE VISIT   Karen [name], Karen [name]  DOB:  1955-12-17                                       01/08/2007  CHART#:[id number]   The patient is a 55 year old female referred by Dr. Salem Senate for  consideration of hemodialysis access placement.  She has end-stage renal  disease secondary to hypertension.  She was on peritoneal dialysis  around 2003.  She then had a renal transplant from 2005 to 2007.  After  this failed, she went back to peritoneal dialysis but developed  peritonitis in 2007.  She currently is dialyzing by a right-sided Diatek  catheter.  She is right-handed.   PAST MEDICAL HISTORY:  Otherwise, unremarkable.   FAMILY HISTORY:  Unremarkable.   SOCIAL HISTORY:  She is single.  She has 1 child.  She quit smoking 30  years ago.  She does not consume alcohol regularly.   MEDICATIONS:  Include dilantin 100 mg twice a day, lisinopril 20 mg once  a day, multivitamin once a day, Toprol XL 100 mg once a day, MagOx 400  mg 1 twice daily, Zocor 20 mg once a day, aspirin 325 mg once a day,  Pepcid 20 mg twice a day.   She has no known drug allergies.   REVIEW OF SYSTEMS:  She has had some recent loss of appetite.  Cardiac, pulmonary, GI, and vascular review of systems are negative.  She does have a history of kidney disease, as mentioned above.  She also has a history of seizures.  Multiple joint arthritis.  Some recent decrease in her visual acuity.  She also has a history of anemia.   PHYSICAL EXAM:  Blood pressure is 106/73 in the left arm.  Heart rate is  84 and regular.  Chest is clear to auscultation.  Cardiac exam is  regular rate and rhythm.  She has 2+ biracial and radial pulses  bilaterally.  She had a vein mapping ultrasound performed today, which  shows the left cephalic vein is small and unusable for placement of a  fistula.  The right cephalic vein was between 30-35 mm in diameter.  This was present in the upper arm.  The cephalic vein  in the forearm is  small and unusable.   I had a lengthy discussion with the patient today about various dialysis  access options, including graft versus fistula.  She has a cephalic vein  in her right arm, which should be usable for a fistula, and I have  explained to her the variability of this would be good long term.  I did  also explain to her the risk of 5 to 10% that fistulas do not mature,  and that we would have to place a graft at a later time.  She is  scheduled currently to have a right brachiocephalic fistula placed on  October 23.  Her current dialysis schedule is Monday, Wednesday, Friday.   Jessy Oto. Fields, MD  Electronically Signed   CEF/MEDQ  D:  01/08/2007  T:  01/09/2007  Job:  [id number]   cc:   Maudie Flakes. Hassell Done, M.D.

## 2010-08-15 NOTE — Discharge Summary (Signed)
Karen [name], Karen [name] NO.:  [id number]   MEDICAL RECORD NO.:  [id number]          PATIENT TYPE:  INP   LOCATION:  3025                         FACILITY:  [facility]   PHYSICIAN:  Sherril Croon, M.D.   DATE OF BIRTH:  Sep 22, 1955   DATE OF ADMISSION:  11/29/2006  DATE OF DISCHARGE:  12/02/2006                               DISCHARGE SUMMARY   DISCHARGE DIAGNOSES:  1. Post-reversible encephalopathy syndrome.  2. 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  3. Hypertension.  4. End-stage renal disease.  5. Secondary hyperparathyroidism.  6. Hyperlipidemia.   HISTORY OF PRESENT ILLNESS:  Karen [name] is a 55 year old African  American woman with past medical history significant for end-stage renal  disease which was diagnosed in October, 2003.  She is status post failed  kidney transplant which was done at [facility] in September, 2005, and had up  until recently nonperitoneal dialysis which was replaced by hemodialysis  due to recent peritonitis.  The patient presented to the emergency room  on November 29, 2006, with right lower extremity weakness and stumbling to  the right upon ambulating.  After arrival, the patient had a seizure  without bowel or urinary incontinence or tongue biting.  The patient  reported some palpitations prior to the weakness.  This episode of  weakness started approximately at 3:00 a.m. the morning of admission.  The patient denied prior similar episodes of weakness or seizure.  She  had no other complaints.  She dialyzes Monday, Wednesday and Friday at  the [facility].  The remainder of physical exam and  past medical history is outlined in the history and physical by Dr.  Katha Cabal.   LABORATORY DATA ON ADMISSION:  White count 10.2, hemoglobin 12.5,  platelets 273,000, ANC 4.1, sodium 137, potassium 3.4, chloride is 102,  bicarb 27, BUN 12, glucose 102, anion gap 8, magnesium 1.7.  Cholesterol  187, triglycerides 151, LDL 112, HDL  43.  Chest x-ray showed right lower  lobe atelectasis or early pneumonia with small right effusion, minimal  left base atelectasis.   PROCEDURES:  1. MR/MRA of the head without contrast:      a.     Diffusion abnormalities in both the left occipital and       parietal lobes.  While this remained suspicious for subacute       infarct, posterior reversible encephalopathy syndrome is also       continued.      b.     MR angiography:  Significant attenuation of distal MCA and       PCA branches bilaterally.  This is compatible with chronic small       vessel disease/intracranial atherosclerosis.      c.     No significant proximal stenosis, aneurysm or branch vessel       occlusion.  2. Hemodialysis.  3. 2-D echocardiogram on November 29, 2006, showed overall left      ventricular systolic function normal; EF approximately 55% with      trivial aortic valve regurgitation and mild mitral valve  regurgitation.  There was no evidence for cardiac source of      embolism.  4. EEG, November 30, 2006, showed essentially normal EEG recording in      awake and sleeping state.  No evidence of ictal or intra-ictal      discharges were noted; Dr. Jannifer Franklin.   CONSULTANTS:  1. Catherine A. Jacolyn Reedy, MD.  2. PT, OT, Speech Therapy consults.   HOSPITAL COURSE:  Problems:  1. Seizures.  The patient was admitted to a telemetry bed with seizure      precautions and advancement of diet once swallowing evaluations      were done.  She was continued on her usual medications for blood      pressure and prednisone.  Magnesium level was slightly low; this      was repleted.  She was dialyzed with no heparin dialysis due to      risk of bleed.  Neurology was consulted, and the patient was seen      by Dr. Jacolyn Reedy who recommended workup with results as described      above.  Findings were consistent with post-reversible      encephalopathy syndrome which for this patient was accompanied with      left parietal  and left occipital ischemia, transient visual field      cuts and right-lower-extremity-greater-than-upper-extremity      weakness.  These all subsequently resolved by the end of her      hospitalization.  She received the usual physical therapy,      occupational therapy and speech therapy.  Workup services will not      be needed at the time of discharge.  Dr. Jacolyn Reedy recommended adding      Zocor and Foltx to reduce stroke risk.  The patient had taken      aspirin before but had stopped it because of hematuria.  She is now      on aspirin 325 mg per day.  The patient had no seizures the      remainder of her hospitalization.  She will have a repeat MRI in 4      months.  She is to follow up with Dr. Jacolyn Reedy in 1 month, and she      is to continue Dilantin for 3-6 months.  Dilantin levels will be      followed monthly at her dialysis center.  2. Post-reversible encephalopathy syndrome as described in #1.  This      was induced apparently by her hypertension.  Her outpatient dry      weight had been lowered aggressively since making the transition to      hemodialysis from peritoneal dialysis.  Her dry weight had been      lowered to 55 kg after her dialysis on November 27, 2006, at which      time, her post-dialysis dry weight was 56.4.  While in the      hospital, her preweight on November 29, 2006, was 61 with a      postweight of 55.5 and no blood pressure drop.  On December 02, 2006, her predialysis weight was 56.9 with a blood pressure 177/96      and a post-dialysis weight of 54.5 with a net UF of 1.9.  During      that treatment, she dropped significantly into the A999333 systolic      range.  Therefore her dry weight will not be changed  and will      continue to be 55 kg.  In addition to her current medications,      clonidine, which was 0.1 mg b.i.d., has been changed to 0.2 mg on      Tuesday, Thursday, Saturday and Sunday b.i.d. and 0.1 mg at bedtime      on her dialysis days.   These medications have been reviewed with      the patient, and she has been instructed to bring these medications      to dialysis on Wednesday for verification.  3. Hypertension, as discussed in #2.  4. End-stage renal disease.  The patient was on no heparin during her      hospitalization due to the risk of bleed.  She will be resumed on      tight heparin.  Estimated dry weight is the same.  Potassium bath      will also be the same.  Lab work will be monitored per protocol at      her dialysis center.  Access issue still to be determined whether      the patient is to return to peritoneal dialysis or not.  Either      way, it is prudent for her to get a fistula placed now in      anticipation of long-term dialysis needs, regardless of whether or      not she goes on peritoneal dialysis in the future or not.  This can      be discussed with her further at the dialysis center.  5. Secondary hyperparathyroidism.  Calcium levels on admission were      9.7 and albumin 2.1, which is equivalent to a corrected calcium of      11 .2.  Her Zemplar, therefore, is placed on hold and will be      monitored per protocol.  The patient will continue Os-Cal 500 mg 1      with meals three times a day.  This should not adversely affect      calcium levels with the discontinuance of the Zemplar.  Phosphorus      level was not checked during this hospitalization.  6. Hyperlipidemia.  As previously mentioned, the patient had elevated      LDLs and borderline triglycerides.  Homocystine was 18 which is      slightly above 4-15 range which is of questionable significance per      Neurology recommendation.  She is started on Foltx and Zocor.      Lipids will be monitored per protocol at her dialysis center.   CONDITION ON DISCHARGE:  Improved.   DISCHARGE MEDICATIONS:  1. Dilantin 100 mg 1 tablet b.i.d.  2. Clonidine 0.2 mg b.i.d. Tuesday, Thursday, Saturday and Sunday; 0.1      mg at bedtime Monday,  Wednesday and Friday.  3. Lisinopril 20 mg daily.  4. Toprol 100 mg at bedtime.  5. Prednisone 1 mg daily through December 15, 2006.  6. Foltx 1 daily.  7. Zocor 20 mg daily.  8. Os-Cal 500 mg 1 with meals three times a day.  9. Aspirin 325 mg daily.   DISCHARGE DIET:  Renal:  Limit fluids to 1200 cc per day.   DISCHARGE ACTIVITIES:  As tolerated.   WOUND CARE:  No showers while she has her dialysis catheter.   FOLLOWUP APPOINTMENTS:  The patient is to call Dr. Michael Litter office for  an appointment in 1 month.   DIALYSIS ORDERS:  Estimated dry weight is the same.  Dialysis center  will stop Zemplar.  Dialysis center will schedule repeat head MRI  without contrast in 4 months.  Free and total Dilantin levels will be  checked monthly x3 or longer as needed.   TIME SPENT:  Spent 45 minutes completing discharge information,  dictating discharge summary and reviewing discharge information with the  patient.      Alric Seton, P.A.      Sherril Croon, M.D.  Electronically Signed    MB/MEDQ  D:  12/02/2006  T:  12/02/2006  Job:  [id number]   cc:   Sherril Croon, M.D.  Catherine A. Jacolyn Reedy, M.D.  [place]

## 2010-08-15 NOTE — Consult Note (Signed)
Karen [name], Karen [name]             ACCOUNT NO.:  [id number]   MEDICAL RECORD NO.:  [id number]          PATIENT TYPE:  INP   LOCATION:  3025                         FACILITY:  [facility]   PHYSICIAN:  Barnetta Chapel A. Jacolyn Reedy, M.D.DATE OF BIRTH:  December 20, 1955   DATE OF CONSULTATION:  11/29/2006  DATE OF DISCHARGE:                                 CONSULTATION   CHIEF COMPLAINT:  I do not feel well.  Elevated blood pressure  followed by seizure with right hemiparesis.   HISTORY OF PRESENT ILLNESS:  Karen [name] is a 55 year old woman with end-  stage renal disease secondary to hypertension.  She had been on  continuous ambulatory peritoneal dialysis since about 2003.  Had a renal  transplant from 2005 to 2007, and that failed and she went back on CAPD  for the last year.  She was just admitted a couple weeks ago with  peritonitis and infected catheter that was removed, and she is on  hemodialysis for the last two weeks for the first time.  Blood pressure  has been up and down.  When she was discharged blood pressure medicines  were discontinued because it was too low.  It has gone up quite a bit  now and so she is on higher doses lately and continuing to have some  problems with it.  She said she woke up around 3 o'clock this morning  just feeling bad in general.  She had a  jumping sensation in her  stomach.  She got somewhat confused, and her right side was heavy.  EMS  was called and found patient with a right hemiparesis, blood pressure  228/124.  On arrival to the emergency room she had a seizure which is  described as focal, but they did not say which side presumably the  right.   REVIEW OF SYSTEMS:  A 12-system review is positive for a jumping stomach  and confusion.  She denies any headache currently.  She denies chest  pain.  She denies shortness of breath.  Denies any stomach pain at this  time.   MEDICATIONS:  She says she is taking lisinopril 20 mg a day, metoprolol  100 mg a day,  prednisone 1 mg a day on a taper.  She was discharged also  on Ambien 5 mg q.h.s., Pepcid 20 mg b.i.d., and Os-Cal.  I am not sure  if she is still taking this.   ALLERGIES:  No known drug allergies.   SOCIAL HISTORY:  She lives in [place] with her mother.  Has one  daughter.  She does not use tobacco, alcohol or recreational drugs.   FAMILY HISTORY:  Positive for kidney disease, coronary artery disease,  and hypertension.   PHYSICAL EXAMINATION/OBJECTIVE:  VITAL SIGNS:  Blood pressure ranges  from A999333 systolic over 99991111 diastolic, heart rate 99991111 range.  It looks sinus on the monitor, respirations 18-20, 98% sat.  HEENT:  Head is normocephalic, atraumatic.  She is edentulous.  NECK:  Supple without bruits.  HEART:  Regular rate and rhythm.  LUNGS:  Clear to auscultation.  ABDOMEN:  Soft and nontender.  SKIN:  Dry.  She has a central line in the right chest wall.  NEUROLOGIC:  She is alert.  She answers 2/2 questions correctly, obeys  2/2 commands correctly.  Gaze is normal.  She has a partial hemianopsia.  She initially was able to identify fingers, but this was not consistent  on the left.  It was always consistent on the right.  When reading she  tended to chop the last word off so I think she has crescentic extreme  right visual field cut.  I gave her one point for that on the stroke  scale.  There is no facial weakness.  There is no drift of either right  or left upper extremity.  No drift of the left lower extremity but she  has a  1+ drift of the right lower extremity which is improving throughout  evaluation.  There is no ataxia, no sensory loss except to dull  simultaneous stimulation.  She had some loss to pinprick in the right  lower extremity.  She has mild anomic aphasia and mild dysarthria and  partial neglect.  She scores five points on the stroke scale for the  right visual field deficit, the right lower extremity drift, mild  aphasia, mild dysarthria,  and mild extinction or partial extinction.  CT  of the brain shows nothing acute, but it is fraught with much artifact,  no blood at any rate.   LABORATORY DATA:  White blood cell count 10.2, hemoglobin 12.5,  hematocrit 38.6, platelets 273.  Sodium 137, potassium 3.4, chloride  102, BUN 15, glucose 102.  Remaining labs are pending at this time.   ASSESSMENT:  1. Focal seizure on the right side.  2. Right field cut, leg weakness, and aphasia which are improving.      This suggests a left brain event of some sort.  She might have a      left distal MCA or ACA branch infarction versus a Todd's paresis.      She did have some other focal brain lesions such as septic embolus.      The source of this is unknown.  The fact that she had seizure at      onset if this is a stroke suggests it would be cardiac.  3. Malignant hypertension.  4. End-stage renal disease.  She was just discharged after admission      for failure of ambulatory intraperitoneal dialysis with      peritonitis, and the catheter was removed.   PLAN:  MRI of the brain, MR angiogram, carotid Doppler, transcranial  Doppler, 2-D echo, blood pressure management, dialysis by renal.  Need  to consider cardiac source such as SBE or clot, etc.      Flint Melter. Jacolyn Reedy, M.D.  Electronically Signed     CAW/MEDQ  D:  11/29/2006  T:  11/30/2006  Job:  [id number]

## 2010-08-18 NOTE — H&P (Signed)
NAMECAYSIE, Karen [name]             ACCOUNT NO.:  [id number]   MEDICAL RECORD NO.:  [id number]          PATIENT TYPE:  INP   LOCATION:  3308                         FACILITY:  [facility]   PHYSICIAN:  Maudie Flakes. Hassell Done, M.D.   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  She presents today to our office dizzy, nauseated, with  diarrhea and vomiting for two weeks.  She has lost 14 pounds in the last two  weeks.  She denies sore throat, rhinorrhea, cough, decrease in urine volume,  dysuria, abdominal pain, rash, chest pain, shortness of breath, lower  extremity swelling, change in vision or hearing, melanotic stools,  joint/muscle pain, or recent fall.   PAST MEDICAL HISTORY:  1.  [facility] renal transplant on December 22, 2003 (Thymoglobulin induction,      immediate graft function, donor and recipient CMV positive, and status      post standard triple immunosuppressive therapy).  2.  Gout.  3.  Anemia.  4.  Hypertension.  5.  End-stage renal disease secondary to hypertension, October 2003.   SOCIAL HISTORY:  Works at a nursing home.  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  Denies  tobacco, alcohol or drug use.   FAMILY HISTORY:  Positive for kidney disease, heart disease and  hypertension.   ALLERGIES:  No known drug allergies.   MEDICATIONS:  1.  Felodipine 10 mg p.o. daily.  2.  Prograf 4 mg p.o. b.i.d.  3.  CellCept 250 mg p.o. b.i.d.  4.  Prednisone 5 mg p.o. daily.  5.  Septra DS one tablet p.o. on Monday, Wednesday, Friday.  6.  Aspirin 81 mg p.o. daily.  7.  Prevacid 20 mg p.o. b.i.d.  8.  Toprol XL 50 mg p.o. daily.   REVIEW OF SYSTEMS:  See HPI and past medical history.   LABORATORY DATA:  Sodium 126, potassium 3.4, chloride 92, CO2  17, creatinine  7.0 (1.9), BUN 47, glucose 133, calcium 7.6 (9.4), phosphorus 5.1 (4.2).  Hemoglobin 9.9 (9.1), WBC 3.5, platelets 326.  Prograf trough pending.  CMV  PCR pending.  C. difficile stool culture pending.   PHYSICAL EXAMINATION:  VITAL SIGNS:  Lying blood pressure 100/80, heart rate  120; sitting blood pressure 90/70, heart rate 140; standing blood pressure  78/50.  Temperature 98.7.  Respirations 18.  GENERAL APPEARANCE:  Alert and oriented x3, no acute distress, accompanied  by daughter today.  HEENT:  Pupils equal, round and reactive to light and accommodation.  Extraocular movements intact.  Mouth dry.  Nares dry.  NECK:  Supple, veins flat.  CARDIAC:  Tachycardic without clicks, gallops or rubs.  LUNGS:  Clear to auscultation without wheezes, rhonchi or rales.  EXTREMITIES:  No edema.  ABDOMEN:  Positive bowel sounds, soft, no guarding, no tenderness.  Left  lower quadrant transplant scar without  tenderness, erythema or swelling.  RECTAL:  Stool was black, negative for heme.  Nonthrombosed hemorrhoids.   ASSESSMENT AND PLAN:  1.  Dehydration.  Half normal saline with bicarb fluids.  Will hold Toprol      and felodipine.  2.  Renal transplant.  CMV and Prograf are pending.  Will continue anti-      rejection medications along with prednisone.  3.  Diarrhea.  C. difficile stool culture pending.  Will give Imodium p.r.n.  4.  Nausea and vomiting.  Will give clears, antiemetics and PPI.  5.  History of hypertension, now hypotensive.  Will hold the Toprol and      felodipine.      Karen Alt, PA    ______________________________  Maudie Flakes. Hassell Done, M.D.    MJG/MEDQ  D:  07/02/2005  T:  07/03/2005  Job:  [id number]   cc:   Transplant Team, [facility]

## 2010-08-18 NOTE — Op Note (Signed)
NAMEADVAITA, MONTET             ACCOUNT NO.:  [id number]   MEDICAL RECORD NO.:  [id number]          PATIENT TYPE:  AMB   LOCATION:  SDS                          FACILITY:  [facility]   PHYSICIAN:  Orson Ape. Weatherly, M.D.DATE OF BIRTH:  1955/11/09   DATE OF PROCEDURE:  01/01/2006  DATE OF DISCHARGE:  01/01/2006                                 OPERATIVE REPORT   PREOPERATIVE DIAGNOSIS:  Failing kidney transplant, desires chronic  ambulatory peritoneal dialysis.   POSTOPERATIVE DIAGNOSIS:  Failing kidney transplant, desires chronic  ambulatory peritoneal dialysis.   OPERATION:  Placement of a Moncrief-Popovich chronic ambulatory peritoneal  dialysis catheter.   SURGEON:  Orson Ape. Rise Patience, M.D.   ASSISTANT:  Nurse.   ANESTHESIA:  General anesthesia.   HISTORY:  Karen [name] is a 55 year old black female, victim of  hypertensive renal failure, who has been on CAPD previously.  She had a  kidney transplant approximately a little over 2 years ago; this is  deteriorating at this time.  Her creatinine is now back up to about nearly  5, and Dr. Hassell Done asked me to place a Moncrief-Popovich catheter, so when it is  needed, we can exteriorize it and resume CAPD.  The patient is in agreement  with this procedure, given 1 g Ancef preoperatively.  Her potassium was 5.2,  and otherwise, her creatinine was approximately 4.9 and a BUN of about 50.   DESCRIPTION OF PROCEDURE:  The patient was induced with general anesthesia,  endotracheal tube.  Abdomen prepped with Betadine Surgical Scrub and  Solution and draped in a sterile manner.  Her kidney is in the left lower  quadrant, and we planned to put the catheter in the right lower quadrant,  where the previous catheter was, and bring it up a little higher on the left  side, so it is above where the kidney is located.  The little small incision  was made.  Sharp dissection down through the skin and subcutaneous tissue.  The anterior rectus  was opened.  The rectus muscles were split and the  rectus fibers, the posterior peritoneum and posterior rectus fascia were  picked up between 2 hemostats, and a small opening carefully made into the  peritoneal cavity.  Fortunately, she did not have adhesion in this area, and  a pursestring suture of 2-0 Vicryl, and then a second pursestring just a  little wide, and then the Moncrief-Popovich catheter over a guide wire was  inserted into the lower abdomen.  The pursestring sutures were tied so that  the internal cuff was lying right at the peritoneal-posterior rectus  junction, and then the second pursestring was tied.  The catheter was  positioned, so it is in the lower abdomen.  Then, we brought it obliquely  through the rectus muscle, closing the muscle over the catheter, and then  closing the external rectus fascia with a 2-0 black Prolene.  The catheter  flushes easily and returns clear.  Then, I tunneled the catheter to the exit  site, and then went across the abdominal wall with the trocar.  The catheter  was then brought  out.  It still flushes easily.  You can barely feel the  catheter.  Of course, her kidney is fairly prominent in the left lower  quadrant.  And then I flushed the catheter with about 3 cc of 100 units of  heparin per cc of sterile saline, and then tied the 2-0 silk to keep the  fluid in the catheter.  I also placed a silk suture around the catheter  right at the external cuff to help exteriorize it when the time is needed.  The catheter was then buried in the subcutaneous tissue, and the incisions  closed with 5-0 nylon,  and antibiotic ointment and a sterile occlusive dressing applied.  The  patient tolerated the procedure nicely, and she was sent to the recovery  room after extubation.  We will check her potassium, and if it is okay, we  will release her to continue all of her chronic medications.           ______________________________  Orson Ape.  Rise Patience, M.D.     WJW/MEDQ  D:  01/01/2006  T:  01/01/2006  Job:  [id number]   cc:   Maudie Flakes. Hassell Done, M.D.

## 2010-08-18 NOTE — Discharge Summary (Signed)
NAMEDARCHELLE, LANGILLE             ACCOUNT NO.:  [id number]   MEDICAL RECORD NO.:  [id number]          PATIENT TYPE:  INP   LOCATION:  5523                         FACILITY:  [facility]   PHYSICIAN:  Maudie Flakes. Hassell Done, M.D.   DATE OF BIRTH:  February 15, 1956   DATE OF ADMISSION:  07/02/2005  DATE OF DISCHARGE:  07/05/2005                                 DISCHARGE SUMMARY   RESIDENT:  Starleen Blue, M.D.   ADMISSION DIAGNOSES:  1.  Nausea, vomiting and diarrhea x2 weeks, with an associated 14-pound      weight loss.  2.  Status post renal transplant on December 22, 2003.  3.  Gout.  4.  Anemia.  5.  Hypertension.  6.  End-stage renal disease, secondary to hypertension since October 2003.   DISCHARGE DIAGNOSES:  1.  Probable acute viral illness, resolved nausea, vomiting and diarrhea.  2.  History of renal transplant.  3.  Acute renal insufficiency.  Discharge creatinine 2.  4.  Hypomagnesemia.  5.  Anemia.  6.  Hypertension.  7.  Gout.   DISCHARGE MEDICATIONS:  1.  Felodipine 10 mg daily.  2.  Prograf 1 mg b.i.d.  3.  CellCept 250 mg b.i.d.  4.  Prednisone 10 mg daily.  (Dose may be changed at next office visit.)      Before discharge the patient was on 10 mg daily.  5.  Septra DS one tab on Monday, Wednesday and Friday.  6.  Aspirin 81 mg daily.  7.  Pepcid 20 mg b.i.d.  8.  Toprol XL 50 mg daily.  9.  Aranesp 200 mcg daily.   HISTORY OF PRESENT ILLNESS:  Ms. Guire is a 55 year old female who is  status post renal transplant in September 2005, at [facility], who is employed  full-time as a C.N.A., who presented to the outpatient office with  dizziness, nausea, with diarrhea and vomiting for two weeks.  She had lost  14 pounds.  She denied sore throat, rhinorrhea, cough, decreased urine  volume, dysuria or abdominal pain, rash, chest pain, shortness of breath,  lower extremity swelling, changes in vision or hearing, melanotic stools,  joint or muscle pain or recent  fall.   ADMISSION LABORATORY DATA:  WBC 3.5, hemoglobin 9.9, platelets 326.  Sodium  126, potassium 3.4, chloride 92, bicarbonate 17, BUN 47, creatinine 7.  Calcium 7.6, phosphorus 5.1.   HOSPITAL COURSE:  #1 - NAUSEA, VOMITING, DIARRHEA:  The differential  diagnoses included prolonged gastroenteritis versus graft rejection, versus  CMV infection, versus side effect from Cellcept.  The patient was admitted  into the hospital and aggressively hydrated with intravenous fluid.  The  nausea and vomiting improved over the course of less than 24 hours without  recurrence of diarrhea.  A C. difficile toxin was negative.  Blood cultures:  Negative growth.  Prograf trough was appropriate.  The patient remains  afebrile during her entire admission.  At admission her CellCept was held  but was restarted on the last day of admission.  On the day of discharge the  patient remained without nausea and vomiting throughout  the entire  admission.  She did have some stooling on the last day of admission, but was  not diarrhea.  CMV and PCR remain pending at an outside facility.  The  patient is able to ambulate and take fluids and solids before discharge.   #2 - HISTORY OF RENAL TRANSPLANT:  Differential diagnoses of chief complaint  included CellCept and Prilosec, versus rejection of transplant kidney.  The  patient was continued on Prograf throughout admission with appropriate  trough levels noted at an outside facility.  The patient was started on Solu-  Medrol 30 mg q.12h. x3 days of admission.  Cellcept was held.  The patient  never complained of abdominal pain or inflammation at the transplant site.  A renal ultrasound was obtained to assess for obstruction.  No obstruction  on ultrasound, nor hydronephrosis.  The patient will be discharged home on  dose of Prograf 4 mg b.i.d. and Cellcept 250 mg b.i.d.  We will keep her  prednisone dosage at 10 mg daily until she is seen at the office on July 09, 2005.   #3 - HYPERTENSION:  On admission the patient was found to be hypotensive.  All hypertensive medications were held, which included felodipine and Toprol  XL.  As the patient was re-hydrated, as well as improvement of diarrhea and  vomiting, the blood pressures were followed appropriately.  She was  restarted on blood pressure medications on the day of discharge and  tolerated them well.   #4 - GOUT:  Well-controlled during admission.  No acute flares.   #5 - ANEMIA:  The patient was admitted with a hemoglobin of 9.9, which fell  slightly to 8.1 with hydration.  The anemia panel was within normal limits.  The patient had good iron stores as well as reticulocyte count.  We will  start her on Aranesp 200 mcg subcu daily.   #6 - HYPOMAGNESEMIA:  The patient was found to have a magnesium of 0.17 on  the second day of admission, associated with hypocalcemia of 6.2.  She did  receive a magnesium transfusion during admission.  The magnesium responded  appropriately.  She will be discharged with a magnesium level of 1.8.   #7 - ACUTE RENAL INSUFFICIENCY:  When the patient was admitted her  creatinine was 7.  This was likely secondary to dehydration from the  vomiting and diarrhea.  Creatinine much improved with hydration.  She will  be discharged with a creatinine of 2.  At her pre-scheduled office visit on  Monday, July 09, 2005, she will go 30 minutes prior to her appointment for a  BMET to follow up on her creatinine.   DISPOSITION:  Home.   FOLLOWUP:  The patient will follow up with Dr. Maudie Flakes. Fox on Monday,  July 09, 2005, at a pre-scheduled appointment.      Starleen Blue, M.D.    ______________________________  Maudie Flakes. Hassell Done, M.D.    VRE/MEDQ  D:  07/05/2005  T:  07/06/2005  Job:  [id number]

## 2010-08-18 NOTE — Op Note (Signed)
NAMELAELANI, DAUGHTRIDGE                         ACCOUNT NO.:  [id number]   MEDICAL RECORD NO.:  [id number]                   PATIENT TYPE:  OIB   LOCATION:  5522                                 FACILITY:  [facility]   PHYSICIAN:  Orson Ape. Rise Patience, M.D.          DATE OF BIRTH:  10/18/1955   DATE OF PROCEDURE:  01/01/2002  DATE OF DISCHARGE:  01/02/2002                                 OPERATIVE REPORT   PREOPERATIVE DIAGNOSES:  1. Obstructed chronic ambulatory peritoneal dialysis catheter.  2. Chronic renal failure.   PROCEDURE:  Laparoscopic correction of obstructed chronic ambulatory  peritoneal dialysis catheter.   ANESTHESIA:  General anesthesia.   SURGEON:  Orson Ape. Rise Patience, M.D.   ASSISTANT:  Sharyn Dross.   HISTORY:  The patient is a 55 year old female who approximately four years  ago with the finding of an elevated BUN and creatinine had a placement of a  Moncrief-Popovich catheter, Richard F. Hassell Done, M.D., thinking that she would  need dialysis in the near future.  However, with medical management of her  blood pressure, etc., it has been now four years before her BUN and  creatinine have progressed to the point that she needs dialysis, and the  Moncrief-Popovich catheter was exteriorized approximately a week ago but had  very sluggish flow.  We tried to reposition the catheter in x-ray on Monday,  but it was still very sluggish and I recommended that we reposition it or  correct the obstruction with the laparoscope, and she is here for the  procedure today.   DESCRIPTION OF PROCEDURE:  Preoperatively she was given 1 g of Kefzol,  introduction of general anesthesia, endotracheal tube, and the abdomen was  prepped with Betadine surgical scrub and solution and draped in a sterile  manner.  I cut off the tip of the old CAPD catheter, this catheter had been  cleaned sterilely, and first tried to inject the carbon dioxide through  this, but it was high enough resistance  that that was not successful.  On  palpation, it feels as if she has a very small weakness at the umbilicus,  and I elected to make a linear incision right over it and then used a  hemostat to kind of carefully enter into the peritoneal cavity.  I then  placed a 5 mm port through this and hooked up the carbon dioxide.  It flowed  nicely and then the camera was inserted after the carbon dioxide was  infused, and you could see the catheter low in the pelvis, and the portion  that you could visualize was not obstructed or an effusion around it.  I  then placed another 5 mm port to the right, sort of in the right upper  quadrant under direct vision, and then used a dissector to kind of  reposition the catheter, and I could see that really within the catheter was  just kind of like a cord or  a worm-like fatty scar-type formation that was  actually causing the blockage when we tried to inject with the saline.  I  elected to grab the end of it with the grasper but working the grasper  through the umbilical port and brought it out, so that I could actually then  flush and clean this area out.  I did send this for culture and then  injected the saline, and now it flows through all the various holes as it  should.  I then placed the catheter back into the peritoneal cavity and  positioned in the pelvis.  Next we elected to close this little fascial  defect with three sutures of 2-0 Prolene but then noted that the omentum had  gotten adherent to this, and we removed those stitches and then replaced  them, making sure that the omentum was well above or below the sutures.  Then with the laparoscope inspection of this, there is no evidence of any  bleeding or omentum adherent to the little umbilical area.  Next we hook a  bag up of D1.5 dialysate to the catheter, and it flows in correctly with low  resistance and drains out nicely.  I then basically turned this fluid off  and removed the 5 mm port under  direct vision.  Next with the patient light,  we went ahead and basically placed more fluid in the peritoneal cavity.  It  flows easily at a rapid rate, drains out nicely, and then I drained the  remaining peritoneal fluid out.  I think what we will do is wait and just  flush her probably on Monday, and she can go ahead and start kind of supine  peritoneal dialysis at that time.  I think this mechanical blockage was  really because of basically the time duration that the catheter has been in  her peritoneal cavity without being used and not that there was anything  unusual about the catheter or its position, etc.  The patient's BUN and  creatinine are not rapidly rising.  Potassium was normal today, and if she  is not having much pain we will release her.  If she is having much pain, we  will wait and let her go home in the morning.                                                Orson Ape. Rise Patience, M.D.    WJW/MEDQ  D:  01/01/2002  T:  01/02/2002  Job:  [id number]   cc:   Maudie Flakes. Hassell Done, M.D.  [address]  [place] [address]  Fax: [phone]

## 2010-12-25 LAB — CBC
HCT: 28.4 — ABNORMAL LOW
Hemoglobin: 9.3 — ABNORMAL LOW
RBC: 2.6 — ABNORMAL LOW
WBC: 8.7

## 2010-12-25 LAB — DIFFERENTIAL
Eosinophils Relative: 6 — ABNORMAL HIGH
Lymphocytes Relative: 14
Lymphs Abs: 1.2
Monocytes Absolute: 0.6
Monocytes Relative: 7

## 2011-01-10 LAB — POCT I-STAT 4, (NA,K, GLUC, HGB,HCT)
Glucose, Bld: 82
HCT: 50 — ABNORMAL HIGH
Hemoglobin: 17 — ABNORMAL HIGH
Operator id: 274481

## 2011-01-12 LAB — LIPID PANEL
Cholesterol: 185
LDL Cholesterol: 112 — ABNORMAL HIGH

## 2011-01-12 LAB — I-STAT 8, (EC8 V) (CONVERTED LAB)
BUN: 15
Chloride: 102
HCT: 46
Hemoglobin: 15.6 — ABNORMAL HIGH
Operator id: 282201
Sodium: 137

## 2011-01-12 LAB — DIFFERENTIAL
Eosinophils Absolute: 0.8 — ABNORMAL HIGH
Lymphs Abs: 3.8 — ABNORMAL HIGH
Monocytes Relative: 8
Neutrophils Relative %: 46

## 2011-01-12 LAB — CBC
MCHC: 32.5
MCV: 89.7
RBC: 4.73
RBC: 4.3
WBC: 5.1
WBC: 10.2

## 2011-01-12 LAB — COMPREHENSIVE METABOLIC PANEL
ALT: 13
Alkaline Phosphatase: 63
CO2: 28
GFR calc non Af Amer: 5 — ABNORMAL LOW
Glucose, Bld: 86
Potassium: 4.1
Sodium: 137

## 2011-01-15 LAB — CBC
HCT: 33.3 — ABNORMAL LOW
HCT: 34.8 — ABNORMAL LOW
HCT: 36.2
Hemoglobin: 11.7 — ABNORMAL LOW
MCHC: 33.3
MCV: 88.8
MCV: 89.1
MCV: 90
MCV: 90
Platelets: 309
Platelets: 332
Platelets: 334
Platelets: 377
Platelets: 463 — ABNORMAL HIGH
RBC: 3.54 — ABNORMAL LOW
RBC: 3.84 — ABNORMAL LOW
RDW: 17.9 — ABNORMAL HIGH
RDW: 17.9 — ABNORMAL HIGH
WBC: 10
WBC: 10.2
WBC: 15.3 — ABNORMAL HIGH
WBC: 7.4
WBC: 5.6

## 2011-01-15 LAB — RENAL FUNCTION PANEL
Albumin: 1 — ABNORMAL LOW
Albumin: 1.3 — ABNORMAL LOW
Albumin: <1 — ABNORMAL LOW
BUN: 78 — ABNORMAL HIGH
Calcium: 7.3 — ABNORMAL LOW
Calcium: 7.5 — ABNORMAL LOW
Chloride: 96
Creatinine, Ser: 14.09 — ABNORMAL HIGH
Creatinine, Ser: 9.68 — ABNORMAL HIGH
GFR calc Af Amer: 3 — ABNORMAL LOW
GFR calc Af Amer: 4 — ABNORMAL LOW
GFR calc Af Amer: 5 — ABNORMAL LOW
GFR calc Af Amer: 5 — ABNORMAL LOW
GFR calc non Af Amer: 3 — ABNORMAL LOW
GFR calc non Af Amer: 3 — ABNORMAL LOW
GFR calc non Af Amer: 4 — ABNORMAL LOW
GFR calc non Af Amer: 4 — ABNORMAL LOW
Glucose, Bld: 113 — ABNORMAL HIGH
Phosphorus: 4.7 — ABNORMAL HIGH
Phosphorus: 6.3 — ABNORMAL HIGH
Phosphorus: 7.1 — ABNORMAL HIGH
Phosphorus: 7.1 — ABNORMAL HIGH
Potassium: 2.3 — CL
Potassium: 2.9 — ABNORMAL LOW
Potassium: 3.3 — ABNORMAL LOW
Sodium: 128 — ABNORMAL LOW
Sodium: 132 — ABNORMAL LOW
Sodium: 132 — ABNORMAL LOW

## 2011-01-15 LAB — COMPREHENSIVE METABOLIC PANEL
AST: 28
Albumin: 1.4 — ABNORMAL LOW
Albumin: 2 — ABNORMAL LOW
Alkaline Phosphatase: 55
BUN: 38 — ABNORMAL HIGH
CO2: 32
Chloride: 88 — ABNORMAL LOW
Chloride: 85 — ABNORMAL LOW
Creatinine, Ser: 9.58 — ABNORMAL HIGH
GFR calc Af Amer: 5 — ABNORMAL LOW
GFR calc non Af Amer: 5 — ABNORMAL LOW
Glucose, Bld: 106 — ABNORMAL HIGH
Potassium: 4
Potassium: 3.3 — ABNORMAL LOW
Total Bilirubin: 1.7 — ABNORMAL HIGH
Total Bilirubin: 1.2
Total Protein: 6.2

## 2011-01-15 LAB — BODY FLUID CELL COUNT WITH DIFFERENTIAL
Eos, Fluid: 2
Lymphs, Fluid: 13
Monocyte-Macrophage-Serous Fluid: 3 — ABNORMAL LOW
Neutrophil Count, Fluid: 81 — ABNORMAL HIGH
Total Nucleated Cell Count, Fluid: 151

## 2011-01-15 LAB — URINALYSIS, ROUTINE W REFLEX MICROSCOPIC
Bilirubin Urine: NEGATIVE
Nitrite: NEGATIVE
Protein, ur: 100 — AB
Urobilinogen, UA: 0.2

## 2011-01-15 LAB — DIFFERENTIAL
Basophils Absolute: 0
Basophils Absolute: 0
Basophils Relative: 0
Eosinophils Relative: 0
Lymphocytes Relative: 8 — ABNORMAL LOW
Monocytes Absolute: 0.5
Monocytes Absolute: 0.2
Monocytes Relative: 3
Neutro Abs: 4.5
Neutrophils Relative %: 81 — ABNORMAL HIGH

## 2011-01-15 LAB — BODY FLUID CULTURE
Culture: NO GROWTH
Gram Stain: NONE SEEN

## 2011-01-15 LAB — PROTIME-INR
INR: 1.4
Prothrombin Time: 17.9 — ABNORMAL HIGH

## 2011-01-15 LAB — HEPATITIS B SURFACE ANTIGEN: Hepatitis B Surface Ag: NEGATIVE

## 2011-01-15 LAB — GRAM STAIN

## 2011-01-15 LAB — URINE MICROSCOPIC-ADD ON

## 2011-01-23 ENCOUNTER — Other Ambulatory Visit: Payer: Self-pay | Admitting: Internal Medicine

## 2011-01-23 DIAGNOSIS — Z1231 Encounter for screening mammogram for malignant neoplasm of breast: Secondary | ICD-10-CM

## 2011-03-05 ENCOUNTER — Ambulatory Visit
Admission: RE | Admit: 2011-03-05 | Discharge: 2011-03-05 | Disposition: A | Discharge status: Home / Self Care | Payer: Medicare Other | Source: Ambulatory Visit | Attending: Internal Medicine | Admitting: Internal Medicine

## 2011-03-05 DIAGNOSIS — Z1231 Encounter for screening mammogram for malignant neoplasm of breast: Secondary | ICD-10-CM

## 2011-06-11 ENCOUNTER — Emergency Department (HOSPITAL_COMMUNITY)
Admission: EM | Admit: 2011-06-11 | Discharge: 2011-06-11 | Disposition: A | Discharge status: Home / Self Care | Payer: Medicare Other | Attending: Emergency Medicine | Admitting: Emergency Medicine

## 2011-06-11 ENCOUNTER — Encounter (HOSPITAL_COMMUNITY): Payer: Self-pay

## 2011-06-11 DIAGNOSIS — R10819 Abdominal tenderness, unspecified site: Secondary | ICD-10-CM | POA: Insufficient documentation

## 2011-06-11 DIAGNOSIS — R1084 Generalized abdominal pain: Secondary | ICD-10-CM | POA: Insufficient documentation

## 2011-06-11 DIAGNOSIS — I10 Essential (primary) hypertension: Secondary | ICD-10-CM | POA: Insufficient documentation

## 2011-06-11 DIAGNOSIS — Z940 Kidney transplant status: Secondary | ICD-10-CM | POA: Insufficient documentation

## 2011-06-11 HISTORY — DX: Kidney transplant status: Z94.0

## 2011-06-11 HISTORY — DX: Essential (primary) hypertension: I10

## 2011-06-11 LAB — DIFFERENTIAL
Eosinophils Relative: 1 % (ref 0–5)
Lymphocytes Relative: 14 % (ref 12–46)
Lymphs Abs: 0.7 10*3/uL (ref 0.7–4.0)
Monocytes Absolute: 0.6 10*3/uL (ref 0.1–1.0)
Monocytes Relative: 14 % — ABNORMAL HIGH (ref 3–12)
Neutro Abs: 3.2 10*3/uL (ref 1.7–7.7)

## 2011-06-11 LAB — COMPREHENSIVE METABOLIC PANEL
AST: 18 U/L (ref 0–37)
BUN: 16 mg/dL (ref 6–23)
CO2: 22 mEq/L (ref 19–32)
Chloride: 101 mEq/L (ref 96–112)
Creatinine, Ser: 0.95 mg/dL (ref 0.50–1.10)
GFR calc Af Amer: 77 mL/min — ABNORMAL LOW (ref >90)
GFR calc non Af Amer: 67 mL/min — ABNORMAL LOW (ref >90)
Glucose, Bld: 110 mg/dL — ABNORMAL HIGH (ref 70–99)
Total Bilirubin: 1.3 mg/dL — ABNORMAL HIGH (ref 0.3–1.2)

## 2011-06-11 LAB — URINALYSIS, ROUTINE W REFLEX MICROSCOPIC
Hgb urine dipstick: NEGATIVE
Nitrite: NEGATIVE
Protein, ur: 30 mg/dL — AB
Urobilinogen, UA: 0.2 mg/dL (ref 0.0–1.0)

## 2011-06-11 LAB — CBC
HCT: 41.1 % (ref 36.0–46.0)
Hemoglobin: 13.9 g/dL (ref 12.0–15.0)
MCV: 83.5 fL (ref 78.0–100.0)
WBC: 4.6 10*3/uL (ref 4.0–10.5)

## 2011-06-11 LAB — URINE MICROSCOPIC-ADD ON

## 2011-06-11 LAB — LIPASE, BLOOD: Lipase: 20 U/L (ref 11–59)

## 2011-06-11 MED ORDER — SODIUM CHLORIDE 0.9 % IV BOLUS (SEPSIS)
500.0000 mL | Freq: Once | INTRAVENOUS | Status: AC
  Administered 2011-06-11: 500 mL via INTRAVENOUS

## 2011-06-11 MED ORDER — MORPHINE SULFATE 4 MG/ML IJ SOLN
4.0000 mg | Freq: Once | INTRAMUSCULAR | Status: AC
  Administered 2011-06-11: 4 mg via INTRAVENOUS
  Filled 2011-06-11: qty 1

## 2011-06-11 MED ORDER — HYDROCODONE-ACETAMINOPHEN 5-325 MG PO TABS: 2.0000 | ORAL_TABLET | ORAL | Status: AC | PRN

## 2011-06-11 MED ORDER — ONDANSETRON HCL 4 MG/2ML IJ SOLN
4.0000 mg | Freq: Once | INTRAMUSCULAR | Status: AC
  Administered 2011-06-11: 4 mg via INTRAVENOUS
  Filled 2011-06-11: qty 2

## 2011-06-11 NOTE — ED Notes (Signed)
Pt arrived via EMS, coming from home, c/o abd pain since 1530 yesterday after eating cook out.

## 2011-06-11 NOTE — ED Provider Notes (Signed)
History     CSN: [id number]  Arrival date & time 06/11/11  [id number]   First MD Initiated Contact with Patient 06/11/11 0813      Chief Complaint  Patient presents with  . Abdominal Pain    (Consider location/radiation/quality/duration/timing/severity/associated sxs/prior treatment) HPI Pt presents with constant, diffuse abd pain starting yesterday at 1530 after eating at [employer]. No N/V/D, fever. Pt is s/p renal transplant x 2 and followed by Dr Hassell Done Past Medical History  Diagnosis Date  . Hypertension   . Kidney transplanted     No past surgical history on file.  No family history on file.  History  Substance Use Topics  . Smoking status: Not on file  . Smokeless tobacco: Not on file  . Alcohol Use:     OB History    Grav Para Term Preterm Abortions TAB SAB Ect Mult Living                  Review of Systems  Constitutional: Negative for fever and chills.  Respiratory: Negative for shortness of breath.   Cardiovascular: Negative for chest pain and leg swelling.  Gastrointestinal: Positive for abdominal pain. Negative for nausea, vomiting, diarrhea, constipation and blood in stool.  Skin: Negative for rash.    Allergies  Review of patient's allergies indicates no known allergies.  Home Medications   Current Outpatient Rx  Name Route Sig Dispense Refill  . AMLODIPINE BESYLATE 10 MG PO TABS Oral Take 10 mg by mouth daily.    . ASPIRIN EC 81 MG PO TBEC Oral Take 81 mg by mouth daily.    Marland Kitchen CINACALCET HCL 30 MG PO TABS Oral Take 30 mg by mouth daily.    Marland Kitchen HYDRALAZINE HCL 25 MG PO TABS Oral Take 25 mg by mouth 2 (two) times daily.    Marland Kitchen MAGNESIUM OXIDE 400 MG PO TABS Oral Take 400 mg by mouth daily.    Marland Kitchen MYCOPHENOLATE MOFETIL 250 MG PO CAPS Oral Take 1,000 mg by mouth 2 (two) times daily.    Marland Kitchen OMEPRAZOLE 20 MG PO CPDR Oral Take 20 mg by mouth daily.    Marland Kitchen PREDNISONE 5 MG PO TABS Oral Take 5 mg by mouth daily.    Marland Kitchen SIMVASTATIN 20 MG PO TABS Oral Take 20 mg by mouth  every evening.    . SULFAMETHOXAZOLE-TRIMETHOPRIM 400-80 MG PO TABS Oral Take 1 tablet by mouth 3 (three) times a week. Takes on Monday, Wednesday and Friday.    Marland Kitchen TACROLIMUS 1 MG PO CAPS Oral Take 4 mg by mouth 2 (two) times daily.    Marland Kitchen HYDROCODONE-ACETAMINOPHEN 5-325 MG PO TABS Oral Take 2 tablets by mouth every 4 (four) hours as needed for pain. 6 tablet 0    BP 152/71  Pulse 83  Temp(Src) 98.2 F (36.8 C) (Oral)  Resp 20  SpO2 96%  Physical Exam  Nursing note and vitals reviewed. Constitutional: She is oriented to person, place, and time. She appears well-developed and well-nourished. No distress.  HENT:  Head: Normocephalic and atraumatic.  Mouth/Throat: Oropharynx is clear and moist.  Eyes: EOM are normal. Pupils are equal, round, and reactive to light.  Neck: Normal range of motion. Neck supple.  Cardiovascular: Normal rate and regular rhythm.   Pulmonary/Chest: Effort normal and breath sounds normal. No respiratory distress. She has no wheezes. She has no rales.  Abdominal: Soft. Bowel sounds are normal. She exhibits no distension. There is tenderness (diffusely without any focality). There is no rebound  and no guarding.  Musculoskeletal: Normal range of motion. She exhibits no edema and no tenderness.  Neurological: She is alert and oriented to person, place, and time.  Skin: Skin is warm and dry. No rash noted. No erythema.  Psychiatric: She has a normal mood and affect. Her behavior is normal.    ED Course  Procedures (including critical care time)  Labs Reviewed  DIFFERENTIAL - Abnormal; Notable for the following:    Monocytes Relative 14 (*)    All other components within normal limits  COMPREHENSIVE METABOLIC PANEL - Abnormal; Notable for the following:    Glucose, Bld 110 (*)    Total Bilirubin 1.3 (*)    GFR calc non Af Amer 67 (*)    GFR calc Af Amer 77 (*)    All other components within normal limits  CBC  LIPASE, BLOOD  URINALYSIS, ROUTINE W REFLEX  MICROSCOPIC   No results found.   1. Generalized abdominal cramps       MDM    Pt states she is feeling much better. Abd pain has resolved. D/C home. Return for worsening pain, fever, chills, or any concenrs      Julianne Rice, MD 06/11/11 1155

## 2011-06-11 NOTE — ED Notes (Signed)
MD at bedside. 

## 2012-01-28 ENCOUNTER — Other Ambulatory Visit: Payer: Self-pay | Admitting: Internal Medicine

## 2012-01-28 DIAGNOSIS — Z1231 Encounter for screening mammogram for malignant neoplasm of breast: Secondary | ICD-10-CM

## 2012-03-11 ENCOUNTER — Ambulatory Visit
Admission: RE | Admit: 2012-03-11 | Discharge: 2012-03-11 | Disposition: A | Discharge status: Home / Self Care | Payer: Medicare Other | Source: Ambulatory Visit | Attending: Internal Medicine | Admitting: Internal Medicine

## 2012-03-11 DIAGNOSIS — Z1231 Encounter for screening mammogram for malignant neoplasm of breast: Secondary | ICD-10-CM

## 2012-09-18 ENCOUNTER — Emergency Department (HOSPITAL_COMMUNITY)
Admission: EM | Admit: 2012-09-18 | Discharge: 2012-09-19 | Disposition: A | Discharge status: Home / Self Care | Payer: BC Managed Care – PPO | Attending: Emergency Medicine | Admitting: Emergency Medicine

## 2012-09-18 ENCOUNTER — Encounter (HOSPITAL_COMMUNITY): Payer: Self-pay | Admitting: *Deleted

## 2012-09-18 DIAGNOSIS — K2970 Gastritis, unspecified, without bleeding: Secondary | ICD-10-CM | POA: Insufficient documentation

## 2012-09-18 DIAGNOSIS — I10 Essential (primary) hypertension: Secondary | ICD-10-CM | POA: Insufficient documentation

## 2012-09-18 DIAGNOSIS — Z79899 Other long term (current) drug therapy: Secondary | ICD-10-CM | POA: Insufficient documentation

## 2012-09-18 DIAGNOSIS — R112 Nausea with vomiting, unspecified: Secondary | ICD-10-CM | POA: Insufficient documentation

## 2012-09-18 DIAGNOSIS — Z940 Kidney transplant status: Secondary | ICD-10-CM | POA: Insufficient documentation

## 2012-09-18 LAB — URINALYSIS, ROUTINE W REFLEX MICROSCOPIC
Bilirubin Urine: NEGATIVE
Leukocytes, UA: NEGATIVE
Nitrite: NEGATIVE
Specific Gravity, Urine: 1.021 (ref 1.005–1.030)
pH: 6 (ref 5.0–8.0)

## 2012-09-18 LAB — COMPREHENSIVE METABOLIC PANEL
ALT: 8 U/L (ref 0–35)
Alkaline Phosphatase: 58 U/L (ref 39–117)
CO2: 24 mEq/L (ref 19–32)
Chloride: 99 mEq/L (ref 96–112)
GFR calc Af Amer: 62 mL/min — ABNORMAL LOW (ref >90)
GFR calc non Af Amer: 53 mL/min — ABNORMAL LOW (ref >90)
Glucose, Bld: 111 mg/dL — ABNORMAL HIGH (ref 70–99)
Potassium: 4.6 mEq/L (ref 3.5–5.1)
Sodium: 134 mEq/L — ABNORMAL LOW (ref 135–145)

## 2012-09-18 LAB — CBC WITH DIFFERENTIAL/PLATELET
Basophils Relative: 0 % (ref 0–1)
Eosinophils Relative: 1 % (ref 0–5)
Hemoglobin: 15.3 g/dL — ABNORMAL HIGH (ref 12.0–15.0)
Lymphocytes Relative: 30 % (ref 12–46)
MCH: 28.9 pg (ref 26.0–34.0)
Neutrophils Relative %: 45 % (ref 43–77)
RBC: 5.3 MIL/uL — ABNORMAL HIGH (ref 3.87–5.11)

## 2012-09-18 LAB — URINE MICROSCOPIC-ADD ON

## 2012-09-18 MED ORDER — SODIUM CHLORIDE 0.9 % IV BOLUS (SEPSIS)
1000.0000 mL | Freq: Once | INTRAVENOUS | Status: AC
  Administered 2012-09-19: 1000 mL via INTRAVENOUS

## 2012-09-18 MED ORDER — MORPHINE SULFATE 4 MG/ML IJ SOLN
4.0000 mg | Freq: Once | INTRAMUSCULAR | Status: AC
  Administered 2012-09-19: 4 mg via INTRAVENOUS
  Filled 2012-09-18: qty 1

## 2012-09-18 MED ORDER — ONDANSETRON HCL 4 MG/2ML IJ SOLN
4.0000 mg | Freq: Once | INTRAMUSCULAR | Status: AC
  Administered 2012-09-19: 4 mg via INTRAVENOUS
  Filled 2012-09-18: qty 2

## 2012-09-18 NOTE — ED Notes (Signed)
Pt had nausea and vomiting today

## 2012-09-18 NOTE — ED Notes (Addendum)
Per EMS: pt seen 5 months ago for same symptoms. Pt complaining of generalized abdominal pain. Pt states that she worked all day and to make herself feel better she made herself vomit. Pt denies flank pain or hematuria.

## 2012-09-19 MED ORDER — ONDANSETRON HCL 4 MG PO TABS: 4.0000 mg | ORAL_TABLET | Freq: Three times a day (TID) | ORAL | Status: DC | PRN

## 2012-09-19 MED ORDER — OXYCODONE-ACETAMINOPHEN 5-325 MG PO TABS: ORAL_TABLET | ORAL | Status: DC

## 2012-09-19 NOTE — ED Notes (Signed)
Pt comfortable with d/c and f/u instructions. Prescriptions x2. 

## 2012-09-19 NOTE — ED Provider Notes (Signed)
History     CSN: [id number]  Arrival date & time 09/18/12  2026   First MD Initiated Contact with Patient 09/18/12 2338      Chief Complaint  Patient presents with  . Abdominal Pain    (Consider location/radiation/quality/duration/timing/severity/associated sxs/prior treatment) HPI Comments: 57 y.o. Female with PMHx of bilateral kidney transplant and abdominal pain, presents today complaining of gradual onset abdominal pain that started this morning. Pt describes the pain as crampy, localized centrally, intermittent. Pt states she feels better lying still. Worse with movement. 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 Pt states she did feel some relief with bowel movement this morning (no diarrhea) and with one episode of vomiting (no blood, no bile). Denies fever, sick contacts, dysuria, hematuria, flank plan, vaginal discharge/bleeding. Pt states similar episode last year (pt states she erroneously told nurse 5 months ago). No follow up at that time. Sx subsided quickly and never returned.   Patient is a 57 y.o. female presenting with abdominal pain.  Abdominal Pain Associated symptoms include abdominal pain, nausea and vomiting. Pertinent negatives include no chest pain, diaphoresis, fever, headaches, neck pain, numbness, rash or weakness.    Past Medical History  Diagnosis Date  . Hypertension   . Kidney transplanted     Past Surgical History  Procedure Laterality Date  . Kidney transplant      History reviewed. No pertinent family history.  History  Substance Use Topics  . Smoking status: Not on file  . Smokeless tobacco: Not on file  . Alcohol Use: Not on file    OB History   Grav Para Term Preterm Abortions TAB SAB Ect Mult Living                  Review of Systems  Constitutional: Negative for fever and diaphoresis.  HENT: Negative for neck pain and neck stiffness.   Eyes: Negative for visual disturbance.  Respiratory: Negative for apnea, chest tightness and  shortness of breath.   Cardiovascular: Negative for chest pain and palpitations.  Gastrointestinal: Positive for nausea, vomiting and abdominal pain. Negative for diarrhea and constipation.       Central, localized, crampy, one episode of vomiting (no blood, no bile)  Genitourinary: Negative for dysuria.  Musculoskeletal: Negative for gait problem.  Skin: Negative for rash.  Neurological: Negative for dizziness, weakness, light-headedness, numbness and headaches.    Allergies  Review of patient's allergies indicates no known allergies.  Home Medications   Current Outpatient Rx  Name  Route  Sig  Dispense  Refill  . amLODipine (NORVASC) 10 MG tablet   Oral   Take 10 mg by mouth daily.         Marland Kitchen aspirin EC 81 MG tablet   Oral   Take 81 mg by mouth daily.         . calcitRIOL (ROCALTROL) 0.25 MCG capsule   Oral   Take 0.25 mcg by mouth daily.         . cinacalcet (SENSIPAR) 30 MG tablet   Oral   Take 30 mg by mouth daily.         . hydrALAZINE (APRESOLINE) 25 MG tablet   Oral   Take 25 mg by mouth 2 (two) times daily.         . magnesium oxide (MAG-OX) 400 MG tablet   Oral   Take 400 mg by mouth 2 (two) times daily.          . mycophenolate (  CELLCEPT) 250 MG capsule   Oral   Take 1,000 mg by mouth 2 (two) times daily.         Marland Kitchen omeprazole (PRILOSEC) 20 MG capsule   Oral   Take 20 mg by mouth daily.         . predniSONE (DELTASONE) 5 MG tablet   Oral   Take 5 mg by mouth daily.         . simvastatin (ZOCOR) 20 MG tablet   Oral   Take 20 mg by mouth every evening.         . sulfamethoxazole-trimethoprim (BACTRIM,SEPTRA) 400-80 MG per tablet   Oral   Take 1 tablet by mouth 3 (three) times a week. Takes on Monday, Wednesday and Friday.         . tacrolimus (PROGRAF) 1 MG capsule   Oral   Take 4 mg by mouth 2 (two) times daily.           BP 146/61  Pulse 78  Temp(Src) 98.4 F (36.9 C) (Oral)  Resp 20  SpO2 94%  Physical Exam    Nursing note and vitals reviewed. Constitutional: She is oriented to person, place, and time. She appears well-developed and well-nourished. No distress.  HENT:  Head: Normocephalic and atraumatic.  Eyes: Conjunctivae and EOM are normal.  Neck: Normal range of motion. Neck supple.  No meningeal signs  Cardiovascular: Normal rate, regular rhythm and normal heart sounds.  Exam reveals no gallop and no friction rub.   No murmur heard. Pulmonary/Chest: Effort normal and breath sounds normal. No respiratory distress. She has no wheezes. She has no rales. She exhibits no tenderness.  Abdominal: Soft. Bowel sounds are normal. She exhibits no distension. There is tenderness. There is no rebound, no guarding and no CVA tenderness.    Mild tenderness to palpation, central abdomen, no guarding, no pain at [facility], no Rovsing's sign  Musculoskeletal: Normal range of motion. She exhibits no edema and no tenderness.  Neurological: She is alert and oriented to person, place, and time. No cranial nerve deficit.  Skin: Skin is warm and dry. She is not diaphoretic. No erythema.  Psychiatric: She has a normal mood and affect.    ED Course  Procedures (including critical care time) Medications  sodium chloride 0.9 % bolus 1,000 mL (1,000 mLs Intravenous New Bag/Given 09/19/12 0007)  morphine 4 MG/ML injection 4 mg (4 mg Intravenous Given 09/19/12 0012)  ondansetron (ZOFRAN) injection 4 mg (4 mg Intravenous Given 09/19/12 0012)    Labs Reviewed  URINALYSIS, ROUTINE W REFLEX MICROSCOPIC - Abnormal; Notable for the following:    Protein, ur >300 (*)    All other components within normal limits  CBC WITH DIFFERENTIAL - Abnormal; Notable for the following:    WBC 3.2 (*)    RBC 5.30 (*)    Hemoglobin 15.3 (*)    Monocytes Relative 24 (*)    Neutro Abs 1.4 (*)    All other components within normal limits  COMPREHENSIVE METABOLIC PANEL - Abnormal; Notable for the following:    Sodium 134 (*)     Glucose, Bld 111 (*)    Creatinine, Ser 1.14 (*)    GFR calc non Af Amer 53 (*)    GFR calc Af Amer 62 (*)    All other components within normal limits  LIPASE, BLOOD  URINE MICROSCOPIC-ADD ON   No results found.   1. Gastritis       MDM  Patient is  afebrile, nontoxic, nonseptic appearing, in no apparent distress.  On physical exam, pt is mildly tender centrally. Does not appear to have a surgical abdomen. No peritoneal signs.  No indication of appendicitis, bowel obstruction, bowel perforation, cholecystitis, diverticulitis, or PID.  Seems more of a gastroenteritis. Will get basic labs, urinalysis and re-evaluate.   Patient's pain and other symptoms adequately managed in emergency department.  Fluid bolus given.  Labs, imaging and vitals reviewed and were unconcerning to warrant imaging at this time. Pt felt better after bolus and management of pain and nausea. Patient discharged home with symptomatic treatment and given strict instructions for follow-up with their primary care physician.  I have also discussed reasons to return immediately to the ER.  Patient expresses understanding and agrees with plan.   Coralee North, PA-C 09/19/12 0104

## 2012-09-20 NOTE — ED Provider Notes (Signed)
Medical screening examination/treatment/procedure(s) were performed by non-physician practitioner and as supervising physician I was immediately available for consultation/collaboration.  Richarda Blade, MD 09/20/12 [phone]

## 2013-01-27 ENCOUNTER — Encounter: Payer: Self-pay | Admitting: Neurology

## 2013-01-28 ENCOUNTER — Ambulatory Visit (INDEPENDENT_AMBULATORY_CARE_PROVIDER_SITE_OTHER): Payer: BC Managed Care – PPO | Admitting: Neurology

## 2013-01-28 ENCOUNTER — Encounter: Payer: Self-pay | Admitting: Neurology

## 2013-01-28 VITALS — BP 151/81 | HR 79 | Wt 161.0 lb

## 2013-01-28 DIAGNOSIS — Z87898 Personal history of other specified conditions: Secondary | ICD-10-CM | POA: Insufficient documentation

## 2013-01-28 DIAGNOSIS — R569 Unspecified convulsions: Secondary | ICD-10-CM

## 2013-01-28 HISTORY — DX: Unspecified convulsions: R56.9

## 2013-01-28 NOTE — Progress Notes (Signed)
Reason for visit: Seizures  Karen [name] is an 57 y.o. 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 The patient has seizure around that time. The patient was initially placed on Dilantin, and the medication has subsequently been withdrawn several years ago. The patient has done well off of this medication. The patient is not felt to have epilepsy, but rather a seizure associated with the severe hypertension. The patient continues to operate a motor vehicle. No other significant medical issues have come up since last seen. The patient has a renal transplant that is functioning. She is not on hemodialysis.   Past Medical History  Diagnosis Date  . Hypertension   . Kidney transplanted   . Seizure disorder   . Dyslipidemia   . Convulsions/seizures 01/28/2013    Past Surgical History  Procedure Laterality Date  . Kidney transplant      04/10/2009  . Catheter removal    . Gallbladder surgery      Family History  Problem Relation Age of Onset  . Stroke Mother   . Seizures Mother   . Migraines Mother   . Diabetes Brother     Social history:  reports that she has quit smoking. Her smoking use included Cigarettes. She smoked 0.00 packs per day. She has never used smokeless tobacco. She reports that she does not drink alcohol or use illicit drugs.   No Known Allergies  Medications:  Current Outpatient Prescriptions on File Prior to Visit  Medication Sig Dispense Refill  . amLODipine (NORVASC) 10 MG tablet Take 10 mg by mouth daily.      Marland Kitchen aspirin EC 81 MG tablet Take 81 mg by mouth daily.      . calcitRIOL (ROCALTROL) 0.25 MCG capsule Take 0.25 mcg by mouth daily. Mon,Wed Fri      . hydrALAZINE (APRESOLINE) 25 MG tablet Take 25 mg by mouth 2 (two) times daily.      .  magnesium oxide (MAG-OX) 400 MG tablet Take 400 mg by mouth 2 (two) times daily.       . mycophenolate (CELLCEPT) 250 MG capsule Take 500 mg by mouth 2 (two) times daily.       Marland Kitchen omeprazole (PRILOSEC) 20 MG capsule Take 20 mg by mouth daily.      . ondansetron (ZOFRAN) 4 MG tablet Take 1 tablet (4 mg total) by mouth every 8 (eight) hours as needed for nausea.  10 tablet  0  . oxyCODONE-acetaminophen (PERCOCET/ROXICET) 5-325 MG per tablet May take 1 or 2 tablets PO q 4-6 hours for severe pain - Do not take with Tylenol as this tablet already contains tylenol  5 tablet  0  . predniSONE (DELTASONE) 5 MG tablet Take 5 mg by mouth daily.      . simvastatin (ZOCOR) 20 MG tablet Take 20 mg by mouth every evening.      . sulfamethoxazole-trimethoprim (BACTRIM,SEPTRA) 400-80 MG per tablet Take 1 tablet by mouth 3 (three) times a week. Takes on Monday, Wednesday and Friday.      . tacrolimus (PROGRAF) 1 MG capsule Take 4 mg by mouth 2 (two) times daily.       No current facility-administered medications on file prior to visit.    ROS:  Out of a complete 14 system review  of symptoms, the patient complains only of the following symptoms, and all other reviewed systems are negative.  History of seizure, PRES syndrome  Blood pressure 151/81, pulse 79, weight 161 lb (73.029 kg).  Physical Exam  General: The patient is alert and cooperative at the time of the examination.  Skin: No significant peripheral edema is noted.   Neurologic Exam  Mental status: The patient is oriented x 3.  Cranial nerves: Facial symmetry is present. Speech is normal, no aphasia or dysarthria is noted. Extraocular movements are full. Visual fields are full.  Motor: The patient has good strength in all 4 extremities.  Sensory examination: Soft touch sensation is symmetric on the face, arms, and legs.   Coordination: The patient has good finger-nose-finger and heel-to-shin bilaterally.  Gait and station: The patient  has a normal gait. Tandem gait is normal. Romberg is negative. No drift is seen.  Reflexes: Deep tendon reflexes are symmetric.   Assessment/Plan:  One. History of  PRES syndrome, associated with seizure  2. Status post renal transplant  The patient is coming back to the office today mainly for an evaluation for DMV. This patient does not have epilepsy, and she does not require routine reevaluation for DMV. We would ask that her name be removed from the list individuals with seizure disorders, as this patient will never require anticonvulsant therapy in the future. The patient will followup through this office if needed.   Jill Alexanders MD 01/28/2013 6:14 PM  [facility] [address] [place], [place] [address]  Phone [phone] Fax [phone]

## 2013-02-03 ENCOUNTER — Other Ambulatory Visit: Payer: Self-pay

## 2013-02-03 DIAGNOSIS — Z1231 Encounter for screening mammogram for malignant neoplasm of breast: Secondary | ICD-10-CM

## 2013-03-30 ENCOUNTER — Ambulatory Visit
Admission: RE | Admit: 2013-03-30 | Discharge: 2013-03-30 | Disposition: A | Discharge status: Home / Self Care | Payer: BC Managed Care – PPO | Source: Ambulatory Visit

## 2013-03-30 DIAGNOSIS — Z1231 Encounter for screening mammogram for malignant neoplasm of breast: Secondary | ICD-10-CM

## 2014-12-31 ENCOUNTER — Other Ambulatory Visit: Payer: Self-pay

## 2014-12-31 DIAGNOSIS — Z1231 Encounter for screening mammogram for malignant neoplasm of breast: Secondary | ICD-10-CM

## 2015-01-17 ENCOUNTER — Ambulatory Visit
Admission: RE | Admit: 2015-01-17 | Discharge: 2015-01-17 | Disposition: A | Discharge status: Home / Self Care | Payer: BLUE CROSS/BLUE SHIELD | Source: Ambulatory Visit

## 2015-01-17 DIAGNOSIS — Z1231 Encounter for screening mammogram for malignant neoplasm of breast: Secondary | ICD-10-CM

## 2015-08-13 ENCOUNTER — Ambulatory Visit: Payer: Worker's Compensation

## 2015-08-13 ENCOUNTER — Ambulatory Visit (INDEPENDENT_AMBULATORY_CARE_PROVIDER_SITE_OTHER): Payer: Worker's Compensation | Admitting: Emergency Medicine

## 2015-08-13 VITALS — BP 164/84 | HR 90 | Temp 98.0°F | Resp 16 | Ht 62.0 in | Wt 162.0 lb

## 2015-08-13 DIAGNOSIS — S40022A Contusion of left upper arm, initial encounter: Secondary | ICD-10-CM

## 2015-08-13 DIAGNOSIS — S93402A Sprain of unspecified ligament of left ankle, initial encounter: Secondary | ICD-10-CM

## 2015-08-13 DIAGNOSIS — S8012XA Contusion of left lower leg, initial encounter: Secondary | ICD-10-CM

## 2015-08-13 DIAGNOSIS — Z940 Kidney transplant status: Secondary | ICD-10-CM | POA: Insufficient documentation

## 2015-08-13 NOTE — Patient Instructions (Addendum)
You can take extra strength Tylenol for pain. Please wear your Ace wrap on the left forearm and left thigh. He can apply ice to these areas.  Do not do any lifting pushing or pulling. Return to clinic on Tuesday for recheck.   IF you received an x-ray today, you will receive an invoice from [facility]. Please contact [facility] at [phone] with questions or concerns regarding your invoice.   IF you received labwork today, you will receive an invoice from [employer]. Please contact [facility] at [phone] with questions or concerns regarding your invoice.   Our billing staff will not be able to assist you with questions regarding bills from these companies.  You will be contacted with the lab results as soon as they are available. The fastest way to get your results is to activate your My Chart account. Instructions are located on the last page of this paperwork. If you have not heard from [place] regarding the results in 2 weeks, please contact this office.

## 2015-08-13 NOTE — Progress Notes (Addendum)
By signing my name below, I, Karen [name], attest that this documentation has been prepared under the direction and in the presence of Karen Jordan, MD. Electronically Signed: Judithe [name], ER Scribe. 08/13/2015. 4:24 PM.  Chief Complaint:  Chief Complaint  Patient presents with  . Arm Pain    left side, bruise on arm/ workers comp/ x today  . Leg Pain    swelling/ x today    HPI: Karen [name] is a 60 y.o. female who reports to [facility] today complaining of an injury to her left arm, side and leg that occurred earlier today at work. She was at family day at [employer] where she works, and was loading a Scientific laboratory technician with a corn hole game and the game slipped down and hit the accelerator on the golf cart. The cart then went in circles with her on it a few times then ran into a tree. She was hit by the corn hole board on her arm and then thrown from the golf cart and landed on her left side. She takes aspirin. She has a past hx of kidney replacement.    Past Medical History  Diagnosis Date  . Hypertension   . Kidney transplanted   . Seizure disorder ([place])   . Dyslipidemia   . Convulsions/seizures ([place]) 01/28/2013   Past Surgical History  Procedure Laterality Date  . Kidney transplant      04/10/2009  . Catheter removal    . Gallbladder surgery     Social History   Social History  . Marital Status: Single    Spouse Name: N/A  . Number of Children: 1  . Years of Education: 12   Occupational History  . CNA    Social History Main Topics  . Smoking status: Former Smoker    Types: Cigarettes  . Smokeless tobacco: Never Used     Comment: quit smoking 25 yrs ago  . Alcohol Use: No     Comment: quit 25 years ago  . Drug Use: No  . Sexual Activity: Not Asked   Other Topics Concern  . None   Social History Narrative   Family History  Problem Relation Age of Onset  . Stroke Mother   . Seizures Mother   . Migraines Mother   . Diabetes Brother      No Known Allergies Prior to Admission medications   Medication Sig Start Date End Date Taking? Authorizing Provider  amLODipine (NORVASC) 10 MG tablet Take 10 mg by mouth daily.   Yes Historical Provider, MD  aspirin EC 81 MG tablet Take 81 mg by mouth daily.   Yes Historical Provider, MD  calcitRIOL (ROCALTROL) 0.25 MCG capsule Take 0.25 mcg by mouth daily. Mon,Wed Fri   Yes Historical Provider, MD  hydrALAZINE (APRESOLINE) 25 MG tablet Take 25 mg by mouth 2 (two) times daily.   Yes Historical Provider, MD  magnesium oxide (MAG-OX) 400 MG tablet Take 400 mg by mouth 2 (two) times daily.    Yes Historical Provider, MD  mycophenolate (CELLCEPT) 250 MG capsule Take 500 mg by mouth 2 (two) times daily.    Yes Historical Provider, MD  omeprazole (PRILOSEC) 20 MG capsule Take 20 mg by mouth daily.   Yes Historical Provider, MD  ondansetron (ZOFRAN) 4 MG tablet Take 1 tablet (4 mg total) by mouth every 8 (eight) hours as needed for nausea. 09/19/12  Yes Marny Lowenstein, PA-C  predniSONE (DELTASONE) 5 MG tablet Take 5 mg  by mouth daily.   Yes Historical Provider, MD  simvastatin (ZOCOR) 20 MG tablet Take 20 mg by mouth every evening.   Yes Historical Provider, MD  sulfamethoxazole-trimethoprim (BACTRIM,SEPTRA) 400-80 MG per tablet Take 1 tablet by mouth 3 (three) times a week. Takes on Monday, Wednesday and Friday.   Yes Historical Provider, MD  tacrolimus (PROGRAF) 1 MG capsule Take 4 mg by mouth 2 (two) times daily.   Yes Historical Provider, MD  oxyCODONE-acetaminophen (PERCOCET/ROXICET) 5-325 MG per tablet May take 1 or 2 tablets PO q 4-6 hours for severe pain - Do not take with Tylenol as this tablet already contains tylenol Patient not taking: Reported on 08/13/2015 09/19/12   Marny Lowenstein, PA-C    ROS: The patient denies fevers, chills, night sweats, unintentional weight loss, chest pain, palpitations, wheezing, dyspnea on exertion, nausea, vomiting, abdominal pain, dysuria, hematuria, melena,  numbness, weakness, or tingling.   All other systems have been reviewed and were otherwise negative with the exception of those mentioned in the HPI and as above.    PHYSICAL EXAM: Filed Vitals:   08/13/15 1556  BP: 164/84  Pulse: 90  Temp: 98 F (36.7 C)  Resp: 16   Body mass index is 29.62 kg/(m^2).   General: Alert, no acute distress HEENT:  Normocephalic, atraumatic, oropharynx patent. Eye: [facility] Cardiovascular:  Regular rate and rhythm, no rubs murmurs or gallops.  No Carotid bruits, radial pulse intact. No pedal edema.  Respiratory: Clear to auscultation bilaterally.  No wheezes, rales, or rhonchi.  No cyanosis, no use of accessory musculature Abdominal: No organomegaly, abdomen is soft and non-tender, positive bowel sounds.  No masses. Musculoskeletal: 3/3 inch hematoma on the ulnar side of her left forearm. 2/2 inch hematoma on the lower leg leg just below the knee.  Skin: No rashes. Neurologic: Facial musculature symmetric. Psychiatric: Patient acts appropriately throughout our interaction. Lymphatic: No cervical or submandibular lymphadenopathy   LABS:   EKG/XRAY:   Primary read interpreted by Dr. Everlene Farrier at [facility]. Dg Forearm Left  08/13/2015  CLINICAL DATA:  Left forearm contusion EXAM: LEFT FOREARM - 2 VIEW COMPARISON:  None. FINDINGS: Examination is somewhat limited by patient motion artifact. No acute fracture or dislocation is noted. No soft tissue abnormality is seen. IMPRESSION: No acute abnormality noted. Electronically Signed   By: Inez Catalina M.D.   On: 08/13/2015 17:20   Dg Ankle Complete Left  08/13/2015  CLINICAL DATA:  Left ankle sprain, initial encounter EXAM: LEFT ANKLE COMPLETE - 3+ VIEW COMPARISON:  None. FINDINGS: Four views of left ankle submitted. No acute fracture or subluxation. There is plantar and posterior spurring of calcaneus. Ankle mortise is preserved. IMPRESSION: No acute fracture or subluxation. Ankle mortise is preserved. Plantar  spur of calcaneus. Electronically Signed   By: Lahoma Crocker M.D.   On: 08/13/2015 18:05   Dg Femur Min 2 Views Left  08/13/2015  CLINICAL DATA:  Leg hematoma, initial encounter EXAM: LEFT FEMUR 2 VIEWS COMPARISON:  None. FINDINGS: Mild degenerative changes of the left hip joint are seen. No acute fracture or dislocation is noted. No joint effusion is seen. Postsurgical changes in the pelvis are noted. IMPRESSION: No acute abnormality noted. Electronically Signed   By: Inez Catalina M.D.   On: 08/13/2015 17:19    ASSESSMENT/PLAN: No fracture seen. She will apply ice to the areas and take extra strength Tylenol for discomfort. She had a subluxation of the right shoulder while trying to get dressed. The shoulder slipped  back in quickly with internal/external rotation. Apparently this has been a chronic problem for her.I personally performed the services described in this documentation, which was scribed in my presence. The recorded information has been reviewed and is accurate.   Gross sideeffects, risk and benefits, and alternatives of medications d/w patient. Patient is aware that all medications have potential sideeffects and we are unable to predict every sideeffect or drug-drug interaction that may occur.  Arlyss Queen MD 08/13/2015 4:24 PM

## 2015-12-20 ENCOUNTER — Other Ambulatory Visit: Payer: Self-pay | Admitting: Internal Medicine

## 2015-12-20 DIAGNOSIS — Z1231 Encounter for screening mammogram for malignant neoplasm of breast: Secondary | ICD-10-CM

## 2016-01-18 ENCOUNTER — Ambulatory Visit
Admission: RE | Admit: 2016-01-18 | Discharge: 2016-01-18 | Disposition: A | Discharge status: Home / Self Care | Payer: BLUE CROSS/BLUE SHIELD | Source: Ambulatory Visit | Attending: Internal Medicine | Admitting: Internal Medicine

## 2016-01-18 DIAGNOSIS — Z1231 Encounter for screening mammogram for malignant neoplasm of breast: Secondary | ICD-10-CM

## 2016-10-25 ENCOUNTER — Other Ambulatory Visit (HOSPITAL_COMMUNITY): Payer: Self-pay | Admitting: *Deleted

## 2016-10-26 ENCOUNTER — Ambulatory Visit (HOSPITAL_COMMUNITY)
Admission: RE | Admit: 2016-10-26 | Discharge: 2016-10-26 | Disposition: A | Discharge status: Home / Self Care | Payer: BLUE CROSS/BLUE SHIELD | Source: Ambulatory Visit | Attending: Nephrology | Admitting: Nephrology

## 2016-10-26 DIAGNOSIS — D709 Neutropenia, unspecified: Secondary | ICD-10-CM | POA: Diagnosis not present

## 2016-10-26 MED ORDER — FILGRASTIM 480 MCG/1.6ML IJ SOLN
480.0000 ug | Freq: Once | INTRAMUSCULAR | Status: AC
  Administered 2016-10-26: 480 ug via SUBCUTANEOUS
  Filled 2016-10-26: qty 1.6

## 2016-10-26 NOTE — Discharge Instructions (Signed)
Filgrastim, G-CSF injection  What is this medicine?  FILGRASTIM, G-CSF (fil GRA stim) is a granulocyte colony-stimulating factor that stimulates the growth of neutrophils, a type of white blood cell (WBC) important in the body's fight against infection. It is used to reduce the incidence of fever and infection in patients with certain types of cancer who are receiving chemotherapy that affects the bone marrow, to stimulate blood cell production for removal of WBCs from the body prior to a bone marrow transplantation, to reduce the incidence of fever and infection in patients who have severe chronic neutropenia, and to improve survival outcomes following high-dose radiation exposure that is toxic to the bone marrow.  This medicine may be used for other purposes; ask your health care provider or pharmacist if you have questions.  COMMON BRAND NAME(S): Neupogen, Zarxio  What should I tell my health care provider before I take this medicine?  They need to know if you have any of these conditions:  -kidney disease  -latex allergy  -ongoing radiation therapy  -sickle cell disease  -an unusual or allergic reaction to filgrastim, pegfilgrastim, other medicines, foods, dyes, or preservatives  -pregnant or trying to get pregnant  -breast-feeding  How should I use this medicine?  This medicine is for injection under the skin or infusion into a vein. As an infusion into a vein, it is usually given by a health care professional in a hospital or clinic setting. If you get this medicine at home, you will be taught how to prepare and give this medicine. Refer to the Instructions for Use that come with your medication packaging. Use exactly as directed. Take your medicine at regular intervals. Do not take your medicine more often than directed.  It is important that you put your used needles and syringes in a special sharps container. Do not put them in a trash can. If you do not have a sharps container, call your pharmacist or  healthcare provider to get one.  Talk to your pediatrician regarding the use of this medicine in children. While this drug may be prescribed for children as young as 7 months for selected conditions, precautions do apply.  Overdosage: If you think you have taken too much of this medicine contact a poison control center or emergency room at once.  NOTE: This medicine is only for you. Do not share this medicine with others.  What if I miss a dose?  It is important not to miss your dose. Call your doctor or health care professional if you miss a dose.  What may interact with this medicine?  This medicine may interact with the following medications:  -medicines that may cause a release of neutrophils, such as lithium  This list may not describe all possible interactions. Give your health care provider a list of all the medicines, herbs, non-prescription drugs, or dietary supplements you use. Also tell them if you smoke, drink alcohol, or use illegal drugs. Some items may interact with your medicine.  What should I watch for while using this medicine?  You may need blood work done while you are taking this medicine.  What side effects may I notice from receiving this medicine?  Side effects that you should report to your doctor or health care professional as soon as possible:  -allergic reactions like skin rash, itching or hives, swelling of the face, lips, or tongue  -dizziness or feeling faint  -fever  -pain, redness, or irritation at site where injected  -pinpoint red spots   on the skin  -shortness of breath or breathing problems  -signs and symptoms of kidney injury like trouble passing urine, change in the amount of urine, or red or dark-brown urine  -stomach or side pain, or pain at the shoulder  -swelling  -tiredness  -  unusual bleeding or bruising  Side effects that usually do not require medical attention (report to your doctor or health care professional if they continue or are bothersome):  -bone  pain  -headache  -muscle pain  This list may not describe all possible side effects. Call your doctor for medical advice about side effects. You may report side effects to FDA at 1-800-FDA-1088.  Where should I keep my medicine?  Keep out of the reach of children.  Store in a refrigerator between 2 and 8 degrees C (36 and 46 degrees F). Do not freeze. Keep in carton to protect from light. Throw away this medicine if vials or syringes are left out of the refrigerator for more than 24 hours. Throw away any unused medicine after the expiration date.  NOTE: This sheet is a summary. It may not cover all possible information. If you have questions about this medicine, talk to your doctor, pharmacist, or health care provider.  © 2018 Elsevier/Gold Standard (2014-10-06 10:57:13)

## 2017-01-03 ENCOUNTER — Other Ambulatory Visit: Payer: Self-pay | Admitting: Internal Medicine

## 2017-01-03 DIAGNOSIS — Z1231 Encounter for screening mammogram for malignant neoplasm of breast: Secondary | ICD-10-CM

## 2017-01-23 ENCOUNTER — Ambulatory Visit
Admission: RE | Admit: 2017-01-23 | Discharge: 2017-01-23 | Disposition: A | Discharge status: Home / Self Care | Payer: BLUE CROSS/BLUE SHIELD | Source: Ambulatory Visit | Attending: Internal Medicine | Admitting: Internal Medicine

## 2017-01-23 DIAGNOSIS — Z1231 Encounter for screening mammogram for malignant neoplasm of breast: Secondary | ICD-10-CM

## 2017-09-17 ENCOUNTER — Encounter (HOSPITAL_COMMUNITY): Payer: Self-pay | Admitting: *Deleted

## 2017-09-17 ENCOUNTER — Emergency Department (HOSPITAL_COMMUNITY): Payer: BLUE CROSS/BLUE SHIELD

## 2017-09-17 ENCOUNTER — Emergency Department (HOSPITAL_COMMUNITY)
Admission: EM | Admit: 2017-09-17 | Discharge: 2017-09-17 | Disposition: A | Discharge status: Home / Self Care | Payer: BLUE CROSS/BLUE SHIELD | Attending: Emergency Medicine | Admitting: Emergency Medicine

## 2017-09-17 DIAGNOSIS — Z7982 Long term (current) use of aspirin: Secondary | ICD-10-CM | POA: Diagnosis not present

## 2017-09-17 DIAGNOSIS — Z940 Kidney transplant status: Secondary | ICD-10-CM | POA: Insufficient documentation

## 2017-09-17 DIAGNOSIS — I10 Essential (primary) hypertension: Secondary | ICD-10-CM | POA: Diagnosis not present

## 2017-09-17 DIAGNOSIS — M79604 Pain in right leg: Secondary | ICD-10-CM | POA: Diagnosis present

## 2017-09-17 DIAGNOSIS — Z79899 Other long term (current) drug therapy: Secondary | ICD-10-CM | POA: Diagnosis not present

## 2017-09-17 DIAGNOSIS — Z87891 Personal history of nicotine dependence: Secondary | ICD-10-CM | POA: Diagnosis not present

## 2017-09-17 MED ORDER — HYDROCODONE-ACETAMINOPHEN 5-325 MG PO TABS
1.0000 | ORAL_TABLET | Freq: Once | ORAL | Status: AC
  Administered 2017-09-17: 1 via ORAL
  Filled 2017-09-17: qty 1

## 2017-09-17 NOTE — ED Notes (Signed)
Pt alert and oriented in NAD. Pt verbalized understanding of discharge instructions. Pt plans to take bp medicine when she gets home.

## 2017-09-17 NOTE — ED Notes (Signed)
Patient returned from xray.

## 2017-09-17 NOTE — ED Provider Notes (Signed)
[facility] Provider Note   CSN: [id number] Arrival date & time: 09/17/17  1155     History   Chief Complaint Chief Complaint  Patient presents with  . Back Pain    HPI Karen [name] is a 62 y.o. female with PMH/o seizure, HTN, Kidney transplant [facility] for evaluation of right leg pain that began this morning approximately 3am.  Patient reports that she had pain in the right hip that radiated down the right leg.  She states that she did not have any preceding trauma, injury, fall.  She she has been able to ambulate and bear weight on the leg but does report some worsening pain with those movements.  Patient reports that she took Tylenol with minimal improvement.  Patient states she is not having any numbness/tingling or weakness of the leg.  Patient states she has not noticed any overlying warmth, erythema, wounds.  Patient denies any fevers, chest pain, difficulty breathing, numbness/weakness.   The history is provided by the patient.    Past Medical History:  Diagnosis Date  . Convulsions/seizures ([place]) 01/28/2013  . Dyslipidemia   . Hypertension   . Kidney transplanted   . Seizure disorder [facility])     Patient Active Problem List   Diagnosis Date Noted  . Renal transplant recipient 08/13/2015  . Convulsions/seizures ([place]) 01/28/2013    Past Surgical History:  Procedure Laterality Date  . CATHETER REMOVAL    . GALLBLADDER SURGERY    . KIDNEY TRANSPLANT     04/10/2009     OB History   None      Home Medications    Prior to Admission medications   Medication Sig Start Date End Date Taking? Authorizing Provider  amLODipine (NORVASC) 10 MG tablet Take 10 mg by mouth daily.    [provider]  aspirin EC 81 MG tablet Take 81 mg by mouth daily.    [provider]  calcitRIOL (ROCALTROL) 0.25 MCG capsule Take 0.25 mcg by mouth daily. Mon,Wed Fri    [provider]  hydrALAZINE (APRESOLINE) 25 MG  tablet Take 25 mg by mouth 2 (two) times daily.    [provider]  magnesium oxide (MAG-OX) 400 MG tablet Take 400 mg by mouth 2 (two) times daily.     [provider]  mycophenolate (CELLCEPT) 250 MG capsule Take 500 mg by mouth 2 (two) times daily.     [provider]  omeprazole (PRILOSEC) 20 MG capsule Take 20 mg by mouth daily.    [provider]  ondansetron (ZOFRAN) 4 MG tablet Take 1 tablet (4 mg total) by mouth every 8 (eight) hours as needed for nausea. 09/19/12   Marny Lowenstein, PA-C  oxyCODONE-acetaminophen (PERCOCET/ROXICET) 5-325 MG per tablet May take 1 or 2 tablets PO q 4-6 hours for severe pain - Do not take with Tylenol as this tablet already contains tylenol Patient not taking: Reported on 08/13/2015 09/19/12   Coralee North A, PA-C  predniSONE (DELTASONE) 5 MG tablet Take 5 mg by mouth daily.    [provider]  simvastatin (ZOCOR) 20 MG tablet Take 20 mg by mouth every evening.    [provider]  sulfamethoxazole-trimethoprim (BACTRIM,SEPTRA) 400-80 MG per tablet Take 1 tablet by mouth 3 (three) times a week. Takes on Monday, Wednesday and Friday.    [provider]  tacrolimus (PROGRAF) 1 MG capsule Take 4 mg by mouth 2 (two) times daily.    [provider]  Family History Family History  Problem Relation Age of Onset  . Stroke Mother   . Seizures Mother   . Migraines Mother   . Diabetes Brother     Social History Social History   Tobacco Use  . Smoking status: Former Smoker    Types: Cigarettes  . Smokeless tobacco: Never Used  . Tobacco comment: quit smoking 25 yrs ago  Substance Use Topics  . Alcohol use: No    Comment: quit 25 years ago  . Drug use: No     Allergies   Patient has no known allergies.   Review of Systems Review of Systems  Constitutional: Negative for fever.  Musculoskeletal: Negative for back pain and neck pain.       RLE pain  Skin: Negative for color  change.  Neurological: Negative for weakness and numbness.     Physical Exam Updated Vital Signs BP (!) 178/70 (BP Location: Right Arm)   Pulse 64   Temp 98 F (36.7 C) (Oral)   Resp 20   SpO2 99%   Physical Exam  Constitutional: She appears well-developed and well-nourished.  HENT:  Head: Normocephalic and atraumatic.  Eyes: Conjunctivae and EOM are normal. Right eye exhibits no discharge. Left eye exhibits no discharge. No scleral icterus.  Neck:  Full flexion/extension and lateral movement of neck fully intact. No bony midline tenderness. No deformities or crepitus.   Cardiovascular:  Pulses:      Radial pulses are 2+ on the right side, and 2+ on the left side.       Dorsalis pedis pulses are 2+ on the right side, and 2+ on the left side.  Pulmonary/Chest: Effort normal.  Abdominal: There is no CVA tenderness.  No CVA tenderness bilaterally.  Musculoskeletal:  Diffuse muscular tenderness palpation overlying the right hip just below the bone that extends into the anterior aspect of the right thigh.  No overlying warmth, erythema, edema.  No ecchymosis.  No deformity or crepitus noted.  Flexion/extension of right lower extremity intact without any difficulty.  No abnormalities with internal or external rotation of right lower extremity.  No tenderness of right knee, right tib-fib, right ankle.  No abnormalities of the left lower extremity.  No midline T or L-spine tenderness.  Neurological: She is alert.  Follows commands, Moves all extremities  5/5 strength to BUE and BLE  Sensation intact throughout all major nerve distributions  Skin: Skin is warm and dry. Capillary refill takes less than 2 seconds.  Good distal cap refill.  RLE is not dusky in appearance or cool to touch.  Psychiatric: She has a normal mood and affect. Her speech is normal and behavior is normal.  Nursing note and vitals reviewed.    ED Treatments / Results  Labs (all labs ordered are listed, but only  abnormal results are displayed) Labs Reviewed - No data to display  EKG None  Radiology Dg Hip Unilat W Or Wo Pelvis 2-3 Views Right  Result Date: 09/17/2017 CLINICAL DATA:  Right hip pain radiating into the leg to the level of the knee beginning at 3 a.m. to this morning. The pain has a throbbing in character. No known injury. EXAM: DG HIP (WITH OR WITHOUT PELVIS) 2-3V RIGHT COMPARISON:  None. FINDINGS: The right hemipelvis is subjectively adequately mineralized. AP and lateral views of the right hip reveal preservation of the joint space. The articular surfaces of the right femoral head and acetabulum remains smoothly rounded. The femoral neck, intertrochanteric, and subtrochanteric regions  are normal. IMPRESSION: There is no acute or significant chronic bony abnormality of the right hip. Electronically Signed   By: David  Martinique M.D.   On: 09/17/2017 14:56   Dg Femur Min 2 Views Right  Result Date: 09/17/2017 CLINICAL DATA:  Right hip pain radiating to the knee began at 3 a.m. today. Has a throbbing in character. No known injury. EXAM: RIGHT FEMUR 2 VIEWS COMPARISON:  Right hip series of today's date FINDINGS: The right femur is subjectively adequately mineralized. There is no lytic nor blastic lesion. The observed portions of the right hip joint and right knee are unremarkable. The overlying soft tissues are normal. IMPRESSION: There is no acute or significant chronic bony abnormality of the right femur. Electronically Signed   By: David  Martinique M.D.   On: 09/17/2017 14:57    Procedures Procedures (including critical care time)  Medications Ordered in ED Medications  HYDROcodone-acetaminophen (NORCO/VICODIN) 5-325 MG per tablet 1 tablet (1 tablet Oral Given 09/17/17 1456)     Initial Impression / Assessment and Plan / ED Course  I have reviewed the triage vital signs and the nursing notes.  Pertinent labs & imaging results that were available during my care of the patient were reviewed  by me and considered in my medical decision making (see chart for details).     89-year-old female with past medical history of HTN, kidney transplant who presents for evaluation of right leg pain that began today.  No fevers, warmth, redness, deformity or crepitus.  No numbness/weakness.  No complaints of fever.  Patient with no complaints of preceding trauma, fall, injury.  The initial triage complaint stated that patient was having back pain.  On my evaluation, patient denies any back pain.  She has no tenderness noted to the spine. Patient is afebrile, non-toxic appearing, sitting comfortably on examination table.  Vital signs reviewed.  Patient is hypertensive.  Patient with equal pulses in all 4 extremities.  Good distal cap refill and right lower extremities not dusky in appearance.  Consider muscle strain versus contusion.  History/physical exam is not concerning for DVT, septic arthritis, acute arterial embolism, aortic dissection.  We will plan for x-rays for evaluation of any bony lesions or abnormalities.  X-rays of femur and hip reviewed.  No acute bony abnormality's.  Discussed results with patient.  Given that patient is able to ambulate and bear weight and has no history of trauma, fall, injury, do not suspect occult fracture at this time and feel that there is no further imaging indicated.  Discussed results with patient.  We will plan to treat as muscle strain.  Encourage at home supportive therapies.  Encourage primary care follow-up for further evaluation. Patient had ample opportunity for questions and discussion. All patient's questions were answered with full understanding. Strict return precautions discussed. Patient expresses understanding and agreement to plan.   Final Clinical Impressions(s) / ED Diagnoses   Final diagnoses:  Pain of right lower extremity    ED Discharge Orders    None       Desma Mcgregor 09/17/17 1740    Daleen Bo, MD 09/18/17  1528

## 2017-09-17 NOTE — ED Triage Notes (Signed)
Pt in c/o right lower back pain that is radiating down her leg, started last night, increased pain with movement or ambulation

## 2017-09-17 NOTE — Discharge Instructions (Signed)
You can take 1000 mg of Tylenol.  Do not exceed 4000 mg of Tylenol a day.  Apply heat to affected area.  Follow-up with your primary care doctor in the next 2 to 4 days for further evaluation.  Return to emergency department for any worsening pain, redness or swelling of the leg, warmth of the leg, numbness/weakness, difficulty walking, fever or any other worsening or concerning symptoms.

## 2018-01-09 ENCOUNTER — Other Ambulatory Visit: Payer: Self-pay | Admitting: Internal Medicine

## 2018-01-09 DIAGNOSIS — Z1231 Encounter for screening mammogram for malignant neoplasm of breast: Secondary | ICD-10-CM

## 2018-02-24 ENCOUNTER — Ambulatory Visit
Admission: RE | Admit: 2018-02-24 | Discharge: 2018-02-24 | Disposition: A | Discharge status: Home / Self Care | Payer: BLUE CROSS/BLUE SHIELD | Source: Ambulatory Visit | Attending: Internal Medicine | Admitting: Internal Medicine

## 2018-02-24 DIAGNOSIS — Z1231 Encounter for screening mammogram for malignant neoplasm of breast: Secondary | ICD-10-CM

## 2019-01-05 ENCOUNTER — Other Ambulatory Visit: Payer: Self-pay | Admitting: Internal Medicine

## 2019-01-05 DIAGNOSIS — Z1231 Encounter for screening mammogram for malignant neoplasm of breast: Secondary | ICD-10-CM

## 2019-03-02 ENCOUNTER — Ambulatory Visit: Payer: BLUE CROSS/BLUE SHIELD

## 2019-03-13 ENCOUNTER — Other Ambulatory Visit (HOSPITAL_COMMUNITY): Payer: Self-pay | Admitting: *Deleted

## 2019-03-13 NOTE — Discharge Instructions (Signed)

## 2019-03-16 ENCOUNTER — Ambulatory Visit (HOSPITAL_COMMUNITY)
Admission: RE | Admit: 2019-03-16 | Discharge: 2019-03-16 | Disposition: A | Discharge status: Home / Self Care | Payer: BC Managed Care – PPO | Source: Ambulatory Visit | Attending: Nephrology | Admitting: Nephrology

## 2019-03-16 DIAGNOSIS — N185 Chronic kidney disease, stage 5: Secondary | ICD-10-CM | POA: Diagnosis not present

## 2019-03-16 DIAGNOSIS — D631 Anemia in chronic kidney disease: Secondary | ICD-10-CM | POA: Diagnosis not present

## 2019-03-16 LAB — POCT HEMOGLOBIN-HEMACUE: Hemoglobin: 10.4 g/dL — ABNORMAL LOW (ref 12.0–15.0)

## 2019-03-16 MED ORDER — EPOETIN ALFA-EPBX 2000 UNIT/ML IJ SOLN: 3000.0000 [IU] | Freq: Once | INTRAMUSCULAR | Status: DC

## 2019-03-16 MED ORDER — EPOETIN ALFA-EPBX 10000 UNIT/ML IJ SOLN
5000.0000 [IU] | Freq: Once | INTRAMUSCULAR | Status: DC
Start: 2019-03-16 — End: 2019-03-16

## 2019-03-16 MED ORDER — EPOETIN ALFA-EPBX 2000 UNIT/ML IJ SOLN
INTRAMUSCULAR | Status: AC
  Administered 2019-03-16: 14:00:00 via SUBCUTANEOUS
  Filled 2019-03-16: qty 1

## 2019-03-16 MED ORDER — EPOETIN ALFA-EPBX 3000 UNIT/ML IJ SOLN
INTRAMUSCULAR | Status: AC
  Administered 2019-03-16: 14:00:00 via SUBCUTANEOUS
  Filled 2019-03-16: qty 1

## 2019-03-16 MED ORDER — EPOETIN ALFA-EPBX 2000 UNIT/ML IJ SOLN
2000.0000 [IU] | Freq: Once | INTRAMUSCULAR | Status: DC
Start: 2019-03-16 — End: 2019-03-17

## 2019-03-30 ENCOUNTER — Other Ambulatory Visit: Payer: Self-pay

## 2019-03-30 ENCOUNTER — Ambulatory Visit (HOSPITAL_COMMUNITY)
Admission: RE | Admit: 2019-03-30 | Discharge: 2019-03-30 | Disposition: A | Discharge status: Home / Self Care | Payer: BC Managed Care – PPO | Source: Ambulatory Visit | Attending: Nephrology | Admitting: Nephrology

## 2019-03-30 DIAGNOSIS — N185 Chronic kidney disease, stage 5: Secondary | ICD-10-CM | POA: Insufficient documentation

## 2019-03-30 DIAGNOSIS — D631 Anemia in chronic kidney disease: Secondary | ICD-10-CM | POA: Insufficient documentation

## 2019-03-30 LAB — POCT HEMOGLOBIN-HEMACUE: Hemoglobin: 12 g/dL (ref 12.0–15.0)

## 2019-03-30 MED ORDER — EPOETIN ALFA-EPBX 10000 UNIT/ML IJ SOLN: 5000.0000 [IU] | INTRAMUSCULAR | Status: DC

## 2019-04-10 ENCOUNTER — Ambulatory Visit: Payer: BC Managed Care – PPO | Attending: Internal Medicine

## 2019-04-10 DIAGNOSIS — Z20822 Contact with and (suspected) exposure to covid-19: Secondary | ICD-10-CM

## 2019-04-12 LAB — NOVEL CORONAVIRUS, NAA: SARS-CoV-2, NAA: DETECTED — AB

## 2019-04-13 ENCOUNTER — Telehealth: Payer: Self-pay | Admitting: Nurse Practitioner

## 2019-04-13 ENCOUNTER — Encounter (HOSPITAL_COMMUNITY): Payer: BC Managed Care – PPO

## 2019-04-13 NOTE — Telephone Encounter (Signed)
Called to Discuss with patient about Covid symptoms and the use of bamlanivimab, a monoclonal antibody infusion for those with mild to moderate Covid symptoms and at a high risk of hospitalization.     Pt is qualified for this infusion at the [facility] infusion center due to co-morbid conditions and/or a member of an at-risk group.     Unable to reach pt  

## 2019-04-17 ENCOUNTER — Encounter (HOSPITAL_COMMUNITY): Payer: BC Managed Care – PPO

## 2019-04-22 ENCOUNTER — Other Ambulatory Visit (HOSPITAL_COMMUNITY): Payer: Self-pay | Admitting: *Deleted

## 2019-04-23 ENCOUNTER — Other Ambulatory Visit: Payer: Self-pay

## 2019-04-23 ENCOUNTER — Ambulatory Visit (HOSPITAL_COMMUNITY)
Admission: RE | Admit: 2019-04-23 | Discharge: 2019-04-23 | Disposition: A | Discharge status: Home / Self Care | Payer: BC Managed Care – PPO | Source: Ambulatory Visit | Attending: Nephrology | Admitting: Nephrology

## 2019-04-23 DIAGNOSIS — N185 Chronic kidney disease, stage 5: Secondary | ICD-10-CM | POA: Diagnosis present

## 2019-04-23 DIAGNOSIS — D631 Anemia in chronic kidney disease: Secondary | ICD-10-CM | POA: Diagnosis not present

## 2019-04-23 LAB — IRON AND TIBC
Iron: 37 ug/dL (ref 28–170)
Saturation Ratios: 19 % (ref 10.4–31.8)
TIBC: 192 ug/dL — ABNORMAL LOW (ref 250–450)
UIBC: 155 ug/dL

## 2019-04-23 LAB — POCT HEMOGLOBIN-HEMACUE: Hemoglobin: 9.8 g/dL — ABNORMAL LOW (ref 12.0–15.0)

## 2019-04-23 LAB — FERRITIN: Ferritin: 674 ng/mL — ABNORMAL HIGH (ref 11–307)

## 2019-04-23 MED ORDER — EPOETIN ALFA-EPBX 3000 UNIT/ML IJ SOLN
INTRAMUSCULAR | Status: AC
  Filled 2019-04-23: qty 1

## 2019-04-23 MED ORDER — EPOETIN ALFA-EPBX 2000 UNIT/ML IJ SOLN
INTRAMUSCULAR | Status: AC
  Filled 2019-04-23: qty 1

## 2019-04-23 MED ORDER — EPOETIN ALFA-EPBX 10000 UNIT/ML IJ SOLN
5000.0000 [IU] | INTRAMUSCULAR | Status: DC
  Administered 2019-04-23: 5000 [IU] via SUBCUTANEOUS

## 2019-05-07 ENCOUNTER — Encounter (HOSPITAL_COMMUNITY): Payer: BC Managed Care – PPO

## 2019-05-14 ENCOUNTER — Ambulatory Visit (HOSPITAL_COMMUNITY)
Admission: RE | Admit: 2019-05-14 | Discharge: 2019-05-14 | Disposition: A | Discharge status: Home / Self Care | Payer: BC Managed Care – PPO | Source: Ambulatory Visit | Attending: Nephrology | Admitting: Nephrology

## 2019-05-14 ENCOUNTER — Other Ambulatory Visit: Payer: Self-pay

## 2019-05-14 ENCOUNTER — Encounter: Payer: Self-pay | Admitting: Nephrology

## 2019-05-14 DIAGNOSIS — D631 Anemia in chronic kidney disease: Secondary | ICD-10-CM | POA: Diagnosis not present

## 2019-05-14 DIAGNOSIS — N185 Chronic kidney disease, stage 5: Secondary | ICD-10-CM | POA: Insufficient documentation

## 2019-05-14 LAB — RENAL FUNCTION PANEL
Albumin: 2.7 g/dL — ABNORMAL LOW (ref 3.5–5.0)
Anion gap: 9 (ref 5–15)
BUN: 27 mg/dL — ABNORMAL HIGH (ref 8–23)
CO2: 20 mmol/L — ABNORMAL LOW (ref 22–32)
Calcium: 8.7 mg/dL — ABNORMAL LOW (ref 8.9–10.3)
Chloride: 99 mmol/L (ref 98–111)
Creatinine, Ser: 1.63 mg/dL — ABNORMAL HIGH (ref 0.44–1.00)
GFR calc Af Amer: 39 mL/min — ABNORMAL LOW (ref >60)
GFR calc non Af Amer: 33 mL/min — ABNORMAL LOW (ref >60)
Glucose, Bld: 128 mg/dL — ABNORMAL HIGH (ref 70–99)
Phosphorus: 3.9 mg/dL (ref 2.5–4.6)
Potassium: 5.4 mmol/L — ABNORMAL HIGH (ref 3.5–5.1)
Sodium: 128 mmol/L — ABNORMAL LOW (ref 135–145)

## 2019-05-14 LAB — IRON AND TIBC
Iron: 18 ug/dL — ABNORMAL LOW (ref 28–170)
Saturation Ratios: 8 % — ABNORMAL LOW (ref 10.4–31.8)
TIBC: 231 ug/dL — ABNORMAL LOW (ref 250–450)
UIBC: 213 ug/dL

## 2019-05-14 LAB — POCT HEMOGLOBIN-HEMACUE: Hemoglobin: 7.9 g/dL — ABNORMAL LOW (ref 12.0–15.0)

## 2019-05-14 LAB — FERRITIN: Ferritin: 521 ng/mL — ABNORMAL HIGH (ref 11–307)

## 2019-05-14 MED ORDER — EPOETIN ALFA-EPBX 10000 UNIT/ML IJ SOLN: 5000.0000 [IU] | INTRAMUSCULAR | Status: DC

## 2019-05-14 MED ORDER — EPOETIN ALFA-EPBX 3000 UNIT/ML IJ SOLN
INTRAMUSCULAR | Status: AC
  Administered 2019-05-14: 3000 [IU] via SUBCUTANEOUS
  Filled 2019-05-14: qty 1

## 2019-05-14 MED ORDER — EPOETIN ALFA-EPBX 2000 UNIT/ML IJ SOLN
INTRAMUSCULAR | Status: AC
  Administered 2019-05-14: 14:00:00 2000 [IU] via SUBCUTANEOUS
  Filled 2019-05-14: qty 1

## 2019-05-21 ENCOUNTER — Other Ambulatory Visit (HOSPITAL_COMMUNITY): Payer: Self-pay | Admitting: *Deleted

## 2019-05-22 ENCOUNTER — Encounter (HOSPITAL_COMMUNITY)
Admission: RE | Admit: 2019-05-22 | Discharge: 2019-05-22 | Disposition: A | Discharge status: Home / Self Care | Payer: BC Managed Care – PPO | Source: Ambulatory Visit | Attending: Nephrology | Admitting: Nephrology

## 2019-05-22 ENCOUNTER — Other Ambulatory Visit: Payer: Self-pay

## 2019-05-22 DIAGNOSIS — N185 Chronic kidney disease, stage 5: Secondary | ICD-10-CM | POA: Insufficient documentation

## 2019-05-22 DIAGNOSIS — D631 Anemia in chronic kidney disease: Secondary | ICD-10-CM | POA: Insufficient documentation

## 2019-05-22 LAB — POCT HEMOGLOBIN-HEMACUE: Hemoglobin: 8.5 g/dL — ABNORMAL LOW (ref 12.0–15.0)

## 2019-05-22 MED ORDER — EPOETIN ALFA-EPBX 2000 UNIT/ML IJ SOLN
INTRAMUSCULAR | Status: AC
  Administered 2019-05-22: 2000 [IU] via SUBCUTANEOUS
  Filled 2019-05-22: qty 1

## 2019-05-22 MED ORDER — EPOETIN ALFA-EPBX 3000 UNIT/ML IJ SOLN
INTRAMUSCULAR | Status: AC
  Administered 2019-05-22: 3000 [IU] via SUBCUTANEOUS
  Filled 2019-05-22: qty 1

## 2019-05-22 MED ORDER — EPOETIN ALFA-EPBX 10000 UNIT/ML IJ SOLN: 5000.0000 [IU] | INTRAMUSCULAR | Status: DC

## 2019-05-28 ENCOUNTER — Encounter (HOSPITAL_COMMUNITY): Payer: BC Managed Care – PPO

## 2019-06-04 ENCOUNTER — Other Ambulatory Visit (HOSPITAL_COMMUNITY): Payer: Self-pay | Admitting: *Deleted

## 2019-06-05 ENCOUNTER — Encounter (HOSPITAL_COMMUNITY)
Admission: RE | Admit: 2019-06-05 | Discharge: 2019-06-05 | Disposition: A | Discharge status: Home / Self Care | Payer: BC Managed Care – PPO | Source: Ambulatory Visit | Attending: Nephrology | Admitting: Nephrology

## 2019-06-05 ENCOUNTER — Encounter: Payer: Self-pay | Admitting: Gastroenterology

## 2019-06-05 ENCOUNTER — Other Ambulatory Visit: Payer: Self-pay

## 2019-06-05 DIAGNOSIS — D631 Anemia in chronic kidney disease: Secondary | ICD-10-CM | POA: Insufficient documentation

## 2019-06-05 DIAGNOSIS — N185 Chronic kidney disease, stage 5: Secondary | ICD-10-CM | POA: Insufficient documentation

## 2019-06-05 LAB — POCT HEMOGLOBIN-HEMACUE: Hemoglobin: 8.9 g/dL — ABNORMAL LOW (ref 12.0–15.0)

## 2019-06-05 MED ORDER — EPOETIN ALFA-EPBX 10000 UNIT/ML IJ SOLN: 5000.0000 [IU] | Freq: Once | INTRAMUSCULAR | Status: DC

## 2019-06-05 MED ORDER — SODIUM CHLORIDE 0.9 % IV SOLN
510.0000 mg | INTRAVENOUS | Status: DC
  Administered 2019-06-05: 510 mg via INTRAVENOUS
  Filled 2019-06-05: qty 17

## 2019-06-05 MED ORDER — EPOETIN ALFA-EPBX 3000 UNIT/ML IJ SOLN
INTRAMUSCULAR | Status: AC
  Administered 2019-06-05: 3000 [IU]
  Filled 2019-06-05: qty 1

## 2019-06-05 MED ORDER — EPOETIN ALFA-EPBX 2000 UNIT/ML IJ SOLN
INTRAMUSCULAR | Status: AC
  Administered 2019-06-05: 2000 [IU]
  Filled 2019-06-05: qty 1

## 2019-06-19 ENCOUNTER — Encounter (HOSPITAL_COMMUNITY)
Admission: RE | Admit: 2019-06-19 | Discharge: 2019-06-19 | Disposition: A | Discharge status: Home / Self Care | Payer: BC Managed Care – PPO | Source: Ambulatory Visit | Attending: Nephrology | Admitting: Nephrology

## 2019-06-19 ENCOUNTER — Other Ambulatory Visit: Payer: Self-pay

## 2019-06-19 DIAGNOSIS — N185 Chronic kidney disease, stage 5: Secondary | ICD-10-CM | POA: Diagnosis not present

## 2019-06-19 LAB — POCT HEMOGLOBIN-HEMACUE: Hemoglobin: 10.3 g/dL — ABNORMAL LOW (ref 12.0–15.0)

## 2019-06-19 MED ORDER — EPOETIN ALFA-EPBX 2000 UNIT/ML IJ SOLN
INTRAMUSCULAR | Status: AC
  Filled 2019-06-19: qty 1

## 2019-06-19 MED ORDER — SODIUM CHLORIDE 0.9 % IV SOLN
510.0000 mg | INTRAVENOUS | Status: AC
  Administered 2019-06-19: 13:00:00 510 mg via INTRAVENOUS
  Filled 2019-06-19: qty 17

## 2019-06-19 MED ORDER — EPOETIN ALFA-EPBX 10000 UNIT/ML IJ SOLN
5000.0000 [IU] | INTRAMUSCULAR | Status: DC
  Administered 2019-06-19: 5000 [IU] via SUBCUTANEOUS

## 2019-06-19 MED ORDER — EPOETIN ALFA-EPBX 3000 UNIT/ML IJ SOLN
INTRAMUSCULAR | Status: AC
  Filled 2019-06-19: qty 1

## 2019-07-01 ENCOUNTER — Other Ambulatory Visit (HOSPITAL_COMMUNITY): Payer: Self-pay | Admitting: *Deleted

## 2019-07-02 ENCOUNTER — Other Ambulatory Visit: Payer: Self-pay

## 2019-07-02 ENCOUNTER — Ambulatory Visit (HOSPITAL_COMMUNITY)
Admission: RE | Admit: 2019-07-02 | Discharge: 2019-07-02 | Disposition: A | Discharge status: Home / Self Care | Payer: BC Managed Care – PPO | Source: Ambulatory Visit | Attending: Nephrology | Admitting: Nephrology

## 2019-07-02 DIAGNOSIS — D631 Anemia in chronic kidney disease: Secondary | ICD-10-CM | POA: Insufficient documentation

## 2019-07-02 DIAGNOSIS — N185 Chronic kidney disease, stage 5: Secondary | ICD-10-CM | POA: Diagnosis not present

## 2019-07-02 LAB — IRON AND TIBC
Iron: 68 ug/dL (ref 28–170)
Saturation Ratios: 27 % (ref 10.4–31.8)
TIBC: 249 ug/dL — ABNORMAL LOW (ref 250–450)
UIBC: 181 ug/dL

## 2019-07-02 LAB — POCT HEMOGLOBIN-HEMACUE: Hemoglobin: 10.6 g/dL — ABNORMAL LOW (ref 12.0–15.0)

## 2019-07-02 LAB — FERRITIN: Ferritin: 730 ng/mL — ABNORMAL HIGH (ref 11–307)

## 2019-07-02 MED ORDER — EPOETIN ALFA-EPBX 3000 UNIT/ML IJ SOLN
INTRAMUSCULAR | Status: AC
  Administered 2019-07-02: 3000 [IU]
  Filled 2019-07-02: qty 1

## 2019-07-02 MED ORDER — EPOETIN ALFA-EPBX 10000 UNIT/ML IJ SOLN: 5000.0000 [IU] | Freq: Once | INTRAMUSCULAR | Status: DC

## 2019-07-02 MED ORDER — EPOETIN ALFA-EPBX 2000 UNIT/ML IJ SOLN
INTRAMUSCULAR | Status: AC
  Administered 2019-07-02: 2000 [IU]
  Filled 2019-07-02: qty 1

## 2019-07-02 NOTE — Telephone Encounter (Signed)
Error

## 2019-07-03 ENCOUNTER — Encounter (HOSPITAL_COMMUNITY): Payer: BC Managed Care – PPO

## 2019-07-09 ENCOUNTER — Ambulatory Visit (INDEPENDENT_AMBULATORY_CARE_PROVIDER_SITE_OTHER): Payer: BC Managed Care – PPO | Admitting: Gastroenterology

## 2019-07-09 ENCOUNTER — Encounter: Payer: Self-pay | Admitting: Gastroenterology

## 2019-07-09 VITALS — BP 160/70 | HR 63 | Temp 98.0°F | Ht 60.0 in | Wt 137.0 lb

## 2019-07-09 DIAGNOSIS — D649 Anemia, unspecified: Secondary | ICD-10-CM

## 2019-07-09 MED ORDER — NA SULFATE-K SULFATE-MG SULF 17.5-3.13-1.6 GM/177ML PO SOLN: 1.0000 | ORAL | 0 refills | Status: AC

## 2019-07-09 NOTE — Progress Notes (Signed)
Referring Provider: Edrick Oh, MD Primary Care Physician:  Edrick Oh, MD  Reason for Consultation:  Anemia   IMPRESSION:  New onset anemia with overt GI blood loss Daily use of prednisone for immunosuppression following kidney transplant Normal screening colonoscopy over 10 years ago No known family history of colon cancer or polyps  Etiology of new onset anemia is unclear.  Evaluation for causes of possible GI blood loss recommended.  PLAN: EGD and Colonoscopy Obtain prior colonoscopy records from [facility]  I consented the patient discussing the risks, benefits, and alternatives to endoscopic evaluation. The patient acknowledges these risks and asks that we proceed.  HPI: Karen [name] is a 64 y.o. female referred by Dr. Justin Mend seen for anemia. Her primary care provider is Dr. Lorene Dy, but she has not seen him over the last year.  Dr. Justin Mend manages her closely.  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 On tacrolimus, prednisone and mycophenelate. Has an annual visit, last seen in February.     Was taking epoetin the year prior to her kidney transplant.   Restarted epoetin last year for symptomatic anemia with fatigue and shortness of breath that developed after having asymptomatic Covid. Has since completed Covid vaccine.  She is not aware of the cause of her anemia.  Labs: 05/22/2019: Hemoglobin 8.5 05/27/2019: Sodium 132, creatinine 1.31 06/05/2019: Hemoglobin 8.9 06/19/2019: Hemoglobin 10.3 07/02/2019: Hemoglobin 10.6, iron 68, ferritin 273, saturation ratios 27  Had a normal colonoscopy over 10 years ago prior to her kidney transplant.   No NSAIDs.   No overt GI blood loss. No melena, hematochezia, bright red blood per  rectum. No epistaxis, vaginal bleeding, hemoptysis, or hematuria.  No changes in bowel habits.   No identified exacerbating or relieving features.  No known family history of colon cancer or polyps. No family history of uterine/endometrial cancer, pancreatic cancer or gastric/stomach cancer.   Past Medical History:  Diagnosis Date  . Anemia associated with chronic renal failure   . Convulsions/seizures ([place]) 01/28/2013  . Dyslipidemia   . Hypertension   . Kidney transplanted   . Seizure disorder [facility])     Past Surgical History:  Procedure Laterality Date  . CATHETER REMOVAL    . GALLBLADDER SURGERY    . KIDNEY TRANSPLANT     04/10/2009    Current Outpatient Medications  Medication Sig Dispense Refill  . amLODipine (NORVASC) 10 MG tablet Take 10 mg by mouth daily.    Marland Kitchen aspirin EC 81 MG tablet Take 81 mg by mouth daily.    . calcitRIOL (ROCALTROL) 0.25 MCG capsule Take 0.25 mcg by mouth daily. Mon,Wed Fri    . hydrALAZINE (APRESOLINE) 25 MG tablet Take 25 mg by mouth 2 (two) times daily.    . magnesium oxide (MAG-OX) 400 MG tablet Take 400 mg by mouth 2 (two) times daily.     . mycophenolate (CELLCEPT) 250 MG capsule Take 500 mg by mouth 2 (two) times daily.     Marland Kitchen omeprazole (PRILOSEC) 20 MG capsule Take 20 mg by mouth daily.    . ondansetron (ZOFRAN) 4 MG tablet Take 1 tablet (4 mg total) by mouth every 8 (eight) hours as needed for nausea. 10 tablet 0  . oxyCODONE-acetaminophen (  PERCOCET/ROXICET) 5-325 MG per tablet May take 1 or 2 tablets PO q 4-6 hours for severe pain - Do not take with Tylenol as this tablet already contains tylenol (Patient not taking: Reported on 08/13/2015) 5 tablet 0  . predniSONE (DELTASONE) 5 MG tablet Take 5 mg by mouth daily.    . simvastatin (ZOCOR) 20 MG tablet Take 20 mg by mouth every evening.    . sulfamethoxazole-trimethoprim (BACTRIM,SEPTRA) 400-80 MG per tablet Take 1 tablet by mouth 3 (three) times a week. Takes on Monday, Wednesday and  Friday.    . tacrolimus (PROGRAF) 1 MG capsule Take 4 mg by mouth 2 (two) times daily.     No current facility-administered medications for this visit.    Allergies as of 07/09/2019  . (No Known Allergies)    Family History  Problem Relation Age of Onset  . Stroke Mother   . Seizures Mother   . Migraines Mother   . Diabetes Brother     Social History   Socioeconomic History  . Marital status: Single    Spouse name: Not on file  . Number of children: 1  . Years of education: 35  . Highest education level: Not on file  Occupational History  . Occupation: CNA  Tobacco Use  . Smoking status: Former Smoker    Types: Cigarettes  . Smokeless tobacco: Never Used  . Tobacco comment: quit smoking 25 yrs ago  Substance and Sexual Activity  . Alcohol use: No    Comment: quit 25 years ago  . Drug use: No  . Sexual activity: Not on file  Other Topics Concern  . Not on file  Social History Narrative  . Not on file   Social Determinants of Health   Financial Resource Strain:   . Difficulty of Paying Living Expenses:   Food Insecurity:   . Worried About Charity fundraiser in the Last Year:   . Arboriculturist in the Last Year:   Transportation Needs:   . Film/video editor (Medical):   Marland Kitchen Lack of Transportation (Non-Medical):   Physical Activity:   . Days of Exercise per Week:   . Minutes of Exercise per Session:   Stress:   . Feeling of Stress :   Social Connections:   . Frequency of Communication with Friends and Family:   . Frequency of Social Gatherings with Friends and Family:   . Attends Religious Services:   . Active Member of Clubs or Organizations:   . Attends Archivist Meetings:   Marland Kitchen Marital Status:   Intimate Partner Violence:   . Fear of Current or Ex-Partner:   . Emotionally Abused:   Marland Kitchen Physically Abused:   . Sexually Abused:     Review of Systems: 12 system ROS is negative except as noted above with the additions of vision changes,  fatigue, shortness of breath.   Physical Exam: General:   Alert,  well-nourished, pleasant and cooperative in NAD Head:  Normocephalic and atraumatic. Eyes:  Sclera clear, no icterus.   Conjunctiva pink. Ears:  Normal auditory acuity. Nose:  No deformity, discharge,  or lesions. Mouth:  No deformity or lesions.   Neck:  Supple; no masses or thyromegaly. Lungs:  Clear throughout to auscultation.   No wheezes. Heart:  Regular rate and rhythm; no murmurs. Abdomen:  Soft,nontender, nondistended, normal bowel sounds, no rebound or guarding. No hepatosplenomegaly.   Rectal:  Deferred  Msk:  Symmetrical. No boney deformities LAD: No inguinal  or umbilical LAD Extremities: RUE AVF.  No clubbing or edema. Neurologic:  Alert and  oriented x4;  grossly nonfocal Skin:  Intact without significant lesions or rashes. Psych:  Alert and cooperative. Normal mood and affect.    Leander Tout L. Tarri Glenn, MD, MPH 07/09/2019, 9:19 AM

## 2019-07-09 NOTE — Patient Instructions (Signed)
You have been scheduled for an endoscopy and colonoscopy. Please follow the written instructions given to you at your visit today. Please pick up your prep supplies at the pharmacy within the next 1-3 days. If you use inhalers (even only as needed), please bring them with you on the day of your procedure.  I value your feedback and thank you for entrusting [place] with your care. If you get a [facility] patient survey, I would appreciate you taking the time to let us know about your experience today. Thank you!   Due to recent changes in healthcare laws, you may see the results of your imaging and laboratory studies on MyChart before your provider has had a chance to review them.  We understand that in some cases there may be results that are confusing or concerning to you. Not all laboratory results come back in the same time frame and the provider may be waiting for multiple results in order to interpret others.  Please give [place] 48 hours in order for your provider to thoroughly review all the results before contacting the office for clarification of your results.

## 2019-07-16 ENCOUNTER — Encounter (HOSPITAL_COMMUNITY): Payer: BC Managed Care – PPO

## 2019-07-17 ENCOUNTER — Ambulatory Visit (HOSPITAL_COMMUNITY)
Admission: RE | Admit: 2019-07-17 | Discharge: 2019-07-17 | Disposition: A | Discharge status: Home / Self Care | Payer: BC Managed Care – PPO | Source: Ambulatory Visit | Attending: Nephrology | Admitting: Nephrology

## 2019-07-17 ENCOUNTER — Other Ambulatory Visit: Payer: Self-pay

## 2019-07-17 DIAGNOSIS — D631 Anemia in chronic kidney disease: Secondary | ICD-10-CM | POA: Diagnosis not present

## 2019-07-17 DIAGNOSIS — N185 Chronic kidney disease, stage 5: Secondary | ICD-10-CM | POA: Insufficient documentation

## 2019-07-17 LAB — POCT HEMOGLOBIN-HEMACUE: Hemoglobin: 10.6 g/dL — ABNORMAL LOW (ref 12.0–15.0)

## 2019-07-17 MED ORDER — EPOETIN ALFA-EPBX 3000 UNIT/ML IJ SOLN
INTRAMUSCULAR | Status: AC
  Filled 2019-07-17: qty 1

## 2019-07-17 MED ORDER — EPOETIN ALFA-EPBX 2000 UNIT/ML IJ SOLN
INTRAMUSCULAR | Status: AC
  Filled 2019-07-17: qty 1

## 2019-07-17 MED ORDER — EPOETIN ALFA-EPBX 10000 UNIT/ML IJ SOLN
5000.0000 [IU] | Freq: Once | INTRAMUSCULAR | Status: AC
  Administered 2019-07-17: 5000 [IU] via SUBCUTANEOUS

## 2019-07-31 ENCOUNTER — Ambulatory Visit (HOSPITAL_COMMUNITY)
Admission: RE | Admit: 2019-07-31 | Discharge: 2019-07-31 | Disposition: A | Discharge status: Home / Self Care | Payer: BC Managed Care – PPO | Source: Ambulatory Visit | Attending: Nephrology | Admitting: Nephrology

## 2019-07-31 ENCOUNTER — Other Ambulatory Visit: Payer: Self-pay

## 2019-07-31 DIAGNOSIS — D631 Anemia in chronic kidney disease: Secondary | ICD-10-CM | POA: Diagnosis not present

## 2019-07-31 DIAGNOSIS — N185 Chronic kidney disease, stage 5: Secondary | ICD-10-CM | POA: Insufficient documentation

## 2019-07-31 LAB — POCT HEMOGLOBIN-HEMACUE: Hemoglobin: 12.6 g/dL (ref 12.0–15.0)

## 2019-07-31 MED ORDER — EPOETIN ALFA-EPBX 10000 UNIT/ML IJ SOLN: 5000.0000 [IU] | Freq: Once | INTRAMUSCULAR | Status: DC

## 2019-07-31 MED ORDER — EPOETIN ALFA-EPBX 3000 UNIT/ML IJ SOLN
INTRAMUSCULAR | Status: AC
  Filled 2019-07-31: qty 1

## 2019-07-31 MED ORDER — EPOETIN ALFA-EPBX 2000 UNIT/ML IJ SOLN
INTRAMUSCULAR | Status: AC
  Filled 2019-07-31: qty 1

## 2019-08-11 ENCOUNTER — Other Ambulatory Visit: Payer: Self-pay

## 2019-08-11 ENCOUNTER — Ambulatory Visit (AMBULATORY_SURGERY_CENTER): Payer: BC Managed Care – PPO | Admitting: Gastroenterology

## 2019-08-11 ENCOUNTER — Encounter: Payer: Self-pay | Admitting: Gastroenterology

## 2019-08-11 VITALS — BP 242/92 | HR 80 | Temp 97.3°F | Resp 15 | Ht 60.0 in | Wt 137.0 lb

## 2019-08-11 DIAGNOSIS — K921 Melena: Secondary | ICD-10-CM | POA: Diagnosis not present

## 2019-08-11 DIAGNOSIS — D124 Benign neoplasm of descending colon: Secondary | ICD-10-CM

## 2019-08-11 DIAGNOSIS — D649 Anemia, unspecified: Secondary | ICD-10-CM

## 2019-08-11 DIAGNOSIS — K2950 Unspecified chronic gastritis without bleeding: Secondary | ICD-10-CM

## 2019-08-11 DIAGNOSIS — K3189 Other diseases of stomach and duodenum: Secondary | ICD-10-CM

## 2019-08-11 DIAGNOSIS — D508 Other iron deficiency anemias: Secondary | ICD-10-CM

## 2019-08-11 MED ORDER — SODIUM CHLORIDE 0.9 % IV SOLN: 500.0000 mL | Freq: Once | INTRAVENOUS | Status: DC

## 2019-08-11 NOTE — Op Note (Signed)
[facility] Patient Name: Nekesha Font Procedure Date: 08/11/2019 1:15 PM MRN: [id number] Endoscopist: Thornton Park MD, MD Age: 64 Referring MD:  Date of Birth: 1955-06-10 Gender: Female Account #: [id number] Procedure:                Upper GI endoscopy Indications:              New onset anemia without overt GI blood loss                           Daily use of prednisone for immunosuppression                            following kidney transplant Medicines:                Monitored Anesthesia Care Procedure:                Pre-Anesthesia Assessment:                           - Prior to the procedure, a History and Physical                            was performed, and patient medications and                            allergies were reviewed. The patient's tolerance of                            previous anesthesia was also reviewed. The risks                            and benefits of the procedure and the sedation                            options and risks were discussed with the patient.                            All questions were answered, and informed consent                            was obtained. Prior Anticoagulants: The patient has                            taken no previous anticoagulant or antiplatelet                            agents. ASA Grade Assessment: II - A patient with                            mild systemic disease. After reviewing the risks                            and benefits, the patient was deemed in  satisfactory condition to undergo the procedure.                           After obtaining informed consent, the endoscope was                            passed under direct vision. Throughout the                            procedure, the patient's blood pressure, pulse, and                            oxygen saturations were monitored continuously. The                            Endoscope was introduced  through the mouth, and                            advanced to the third part of duodenum. The upper                            GI endoscopy was accomplished without difficulty.                            The patient tolerated the procedure well. Scope In: Scope Out: Findings:                 The examined esophagus was normal.                           Diffuse mild inflammation characterized by                            erosions, erythema and friability was found in the                            gastric body. Biopsies were taken from the antrum,                            body, and fundus with a cold forceps for histology.                            Estimated blood loss was minimal.                           The examined duodenum was normal. Biopsies were                            taken with a cold forceps for histology. Estimated                            blood loss was minimal.                           The cardia and gastric  fundus were normal on                            retroflexion.                           The exam was otherwise without abnormality. Complications:            No immediate complications. Estimated blood loss:                            Minimal. Estimated Blood Loss:     Estimated blood loss was minimal. Impression:               - Normal esophagus.                           - Gastritis. Biopsied.                           - Normal examined duodenum. Biopsied.                           - The examination was otherwise normal. No obvious                            source for anemia identified. Recommendation:           - Patient has a contact number available for                            emergencies. The signs and symptoms of potential                            delayed complications were discussed with the                            patient. Return to normal activities tomorrow.                            Written discharge instructions were provided to the                             patient.                           - Resume previous diet.                           - Continue present medications.                           - No aspirin, ibuprofen, naproxen, or other                            non-steroidal anti-inflammatory drugs.                           - Await pathology results.  Thornton Park MD, MD 08/11/2019 2:00:35 PM This report has been signed electronically.

## 2019-08-11 NOTE — Op Note (Signed)
[facility] Patient Name: Karen [name] Procedure Date: 08/11/2019 1:15 PM MRN: [id number] Endoscopist: Thornton Park MD, MD Age: 64 Referring MD:  Date of Birth: [birth year] Gender: Female Account #: [id number] Procedure:                Colonoscopy Indications:              New onset anemia with overt GI blood loss                           Normal screening colonoscopy over 10 years ago                           No known family history of colon cancer or polyps Medicines:                Monitored Anesthesia Care Procedure:                Pre-Anesthesia Assessment:                           - Prior to the procedure, a History and Physical                            was performed, and patient medications and                            allergies were reviewed. The patient's tolerance of                            previous anesthesia was also reviewed. The risks                            and benefits of the procedure and the sedation                            options and risks were discussed with the patient.                            All questions were answered, and informed consent                            was obtained. Prior Anticoagulants: The patient has                            taken no previous anticoagulant or antiplatelet                            agents. ASA Grade Assessment: II - A patient with                            mild systemic disease. After reviewing the risks                            and benefits, the patient was deemed in  satisfactory condition to undergo the procedure.                           After obtaining informed consent, the colonoscope                            was passed under direct vision. Throughout the                            procedure, the patient's blood pressure, pulse, and                            oxygen saturations were monitored continuously. The                            Colonoscope was  introduced through the anus and                            advanced to the 4 cm into the ileum. A second                            forward view of the right colon was performed. The                            colonoscopy was performed without difficulty. The                            patient tolerated the procedure well. The quality                            of the bowel preparation was good. The terminal                            ileum, ileocecal valve, appendiceal orifice, and                            rectum were photographed. Scope In: 1:38:00 PM Scope Out: 1:51:41 PM Scope Withdrawal Time: 0 hours 9 minutes 21 seconds  Total Procedure Duration: 0 hours 13 minutes 41 seconds  Findings:                 The perianal and digital rectal examinations were                            normal.                           Non-bleeding internal hemorrhoids were found.                           A 3 mm polyp was found in the descending colon. The                            polyp was sessile. The polyp was removed with a  cold snare. Resection and retrieval were complete.                            Estimated blood loss was minimal.                           The exam was otherwise without abnormality on                            direct and retroflexion views. Complications:            No immediate complications. Estimated blood loss:                            Minimal. Estimated Blood Loss:     Estimated blood loss was minimal. Impression:               - Non-bleeding internal hemorrhoids.                           - One 3 mm polyp in the descending colon, removed                            with a cold snare. Resected and retrieved.                           - The examination was otherwise normal on direct                            and retroflexion views. Recommendation:           - Patient has a contact number available for                            emergencies.  The signs and symptoms of potential                            delayed complications were discussed with the                            patient. Return to normal activities tomorrow.                            Written discharge instructions were provided to the                            patient.                           - Resume previous diet.                           - Continue present medications.                           - Await pathology results.                           -  Repeat colonoscopy date to be determined after                            pending pathology results are reviewed for                            surveillance.                           - Emerging evidence supports eating a diet of                            fruits, vegetables, grains, calcium, and yogurt                            while reducing red meat and alcohol may reduce the                            risk of colon cancer.                           - Thank you for allowing me to be involved in your                            colon cancer prevention. Thornton Park MD, MD 08/11/2019 2:03:14 PM This report has been signed electronically.

## 2019-08-11 NOTE — Progress Notes (Signed)
A/ox3, pleased with MAC, report to RN 

## 2019-08-11 NOTE — Progress Notes (Signed)
CW vitals, LS temp, SM Iv.

## 2019-08-11 NOTE — Progress Notes (Signed)
Called to room to assist during endoscopic procedure.  Patient ID and intended procedure confirmed with present staff. Received instructions for my participation in the procedure from the performing physician.  

## 2019-08-11 NOTE — Patient Instructions (Signed)
No aspirin, aleve naproxen, or ibuprofen until further notice.  YOU HAD AN ENDOSCOPIC PROCEDURE TODAY AT [facility]:   Refer to the procedure report that was given to you for any specific questions about what was found during the examination.  If the procedure report does not answer your questions, please call your gastroenterologist to clarify.  If you requested that your care partner not be given the details of your procedure findings, then the procedure report has been included in a sealed envelope for you to review at your convenience later.  YOU SHOULD EXPECT: Some feelings of bloating in the abdomen. Passage of more gas than usual.  Walking can help get rid of the air that was put into your GI tract during the procedure and reduce the bloating. If you had a lower endoscopy (such as a colonoscopy or flexible sigmoidoscopy) you may notice spotting of blood in your stool or on the toilet paper. If you underwent a bowel prep for your procedure, you may not have a normal bowel movement for a few days.  Please Note:  You might notice some irritation and congestion in your nose or some drainage.  This is from the oxygen used during your procedure.  There is no need for concern and it should clear up in a day or so.  SYMPTOMS TO REPORT IMMEDIATELY:   Following lower endoscopy (colonoscopy or flexible sigmoidoscopy):  Excessive amounts of blood in the stool  Significant tenderness or worsening of abdominal pains  Swelling of the abdomen that is new, acute  Fever of 100F or higher   Following upper endoscopy (EGD)  Vomiting of blood or coffee ground material  New chest pain or pain under the shoulder blades  Painful or persistently difficult swallowing  New shortness of breath  Fever of 100F or higher  Black, tarry-looking stools  For urgent or emergent issues, a gastroenterologist can be reached at any hour by calling [phone]. Do not use MyChart messaging for urgent  concerns.    DIET:  We do recommend a small meal at first, but then you may proceed to your regular diet.  Drink plenty of fluids but you should avoid alcoholic beverages for 24 hours. Try to increase the fiber in your diet, and drink plenty of water.  ACTIVITY:  You should plan to take it easy for the rest of today and you should NOT DRIVE or use heavy machinery until tomorrow (because of the sedation medicines used during the test).    FOLLOW UP: Our staff will call the number listed on your records 48-72 hours following your procedure to check on you and address any questions or concerns that you may have regarding the information given to you following your procedure. If we do not reach you, we will leave a message.  We will attempt to reach you two times.  During this call, we will ask if you have developed any symptoms of COVID 19. If you develop any symptoms (ie: fever, flu-like symptoms, shortness of breath, cough etc.) before then, please call [phone].  If you test positive for Covid 19 in the 2 weeks post procedure, please call and report this information to [place].    If any biopsies were taken you will be contacted by phone or by letter within the next 1-3 weeks.  Please call us at [phone] if you have not heard about the biopsies in 3 weeks.    SIGNATURES/CONFIDENTIALITY: You and/or your care partner have signed  paperwork which will be entered into your electronic medical record.  These signatures attest to the fact that that the information above on your After Visit Summary has been reviewed and is understood.  Full responsibility of the confidentiality of this discharge information lies with you and/or your care-partner.

## 2019-08-13 ENCOUNTER — Ambulatory Visit (HOSPITAL_COMMUNITY)
Admission: RE | Admit: 2019-08-13 | Discharge: 2019-08-13 | Disposition: A | Discharge status: Home / Self Care | Payer: BC Managed Care – PPO | Source: Ambulatory Visit | Attending: Nephrology | Admitting: Nephrology

## 2019-08-13 ENCOUNTER — Telehealth: Payer: Self-pay | Admitting: *Deleted

## 2019-08-13 ENCOUNTER — Other Ambulatory Visit: Payer: Self-pay

## 2019-08-13 DIAGNOSIS — N185 Chronic kidney disease, stage 5: Secondary | ICD-10-CM | POA: Insufficient documentation

## 2019-08-13 DIAGNOSIS — D631 Anemia in chronic kidney disease: Secondary | ICD-10-CM | POA: Diagnosis not present

## 2019-08-13 LAB — IRON AND TIBC
Iron: 52 ug/dL (ref 28–170)
Saturation Ratios: 24 % (ref 10.4–31.8)
TIBC: 218 ug/dL — ABNORMAL LOW (ref 250–450)
UIBC: 166 ug/dL

## 2019-08-13 LAB — FERRITIN: Ferritin: 585 ng/mL — ABNORMAL HIGH (ref 11–307)

## 2019-08-13 LAB — POCT HEMOGLOBIN-HEMACUE: Hemoglobin: 12.2 g/dL (ref 12.0–15.0)

## 2019-08-13 MED ORDER — EPOETIN ALFA-EPBX 10000 UNIT/ML IJ SOLN: 5000.0000 [IU] | Freq: Once | INTRAMUSCULAR | Status: DC

## 2019-08-13 NOTE — Telephone Encounter (Signed)
  Follow up Call-  Call back number 08/11/2019  Post procedure Call Back phone  # [phone]  Permission to leave phone message Yes  Some recent data might be hidden     Patient questions:  Do you have a fever, pain , or abdominal swelling? No. Pain Score  0 *  Have you tolerated food without any problems? Yes.    Have you been able to return to your normal activities? Yes.    Do you have any questions about your discharge instructions: Diet   No. Medications  No. Follow up visit  No.  Do you have questions or concerns about your Care? No.  Actions: * If pain score is 4 or above: 1. No action needed, pain <4.Have you developed a fever since your procedure? no  2.   Have you had an respiratory symptoms (SOB or cough) since your procedure? no  3.   Have you tested positive for COVID 19 since your procedure no  4.   Have you had any family members/close contacts diagnosed with the COVID 19 since your procedure?  no   If yes to any of these questions please route to Joylene John, RN and Erenest Rasher, RN

## 2019-08-14 ENCOUNTER — Encounter: Payer: Self-pay | Admitting: Gastroenterology

## 2019-08-14 ENCOUNTER — Encounter (HOSPITAL_COMMUNITY): Payer: BC Managed Care – PPO

## 2019-08-27 ENCOUNTER — Encounter (HOSPITAL_COMMUNITY): Payer: BC Managed Care – PPO

## 2019-08-27 ENCOUNTER — Encounter (HOSPITAL_COMMUNITY)
Admission: RE | Admit: 2019-08-27 | Discharge: 2019-08-27 | Disposition: A | Discharge status: Home / Self Care | Payer: BC Managed Care – PPO | Source: Ambulatory Visit | Attending: Nephrology | Admitting: Nephrology

## 2019-08-27 DIAGNOSIS — N185 Chronic kidney disease, stage 5: Secondary | ICD-10-CM | POA: Insufficient documentation

## 2019-08-27 DIAGNOSIS — D631 Anemia in chronic kidney disease: Secondary | ICD-10-CM | POA: Insufficient documentation

## 2019-08-27 LAB — POCT HEMOGLOBIN-HEMACUE: Hemoglobin: 11.8 g/dL — ABNORMAL LOW (ref 12.0–15.0)

## 2019-08-27 MED ORDER — EPOETIN ALFA-EPBX 10000 UNIT/ML IJ SOLN: 5000.0000 [IU] | Freq: Once | INTRAMUSCULAR | Status: DC

## 2019-08-27 MED ORDER — EPOETIN ALFA-EPBX 3000 UNIT/ML IJ SOLN
INTRAMUSCULAR | Status: AC
  Administered 2019-08-27: 3000 [IU] via SUBCUTANEOUS
  Filled 2019-08-27: qty 1

## 2019-08-27 MED ORDER — EPOETIN ALFA-EPBX 2000 UNIT/ML IJ SOLN
INTRAMUSCULAR | Status: AC
  Administered 2019-08-27: 2000 [IU] via SUBCUTANEOUS
  Filled 2019-08-27: qty 1

## 2019-09-24 ENCOUNTER — Other Ambulatory Visit: Payer: Self-pay

## 2019-09-24 ENCOUNTER — Ambulatory Visit (HOSPITAL_COMMUNITY)
Admission: RE | Admit: 2019-09-24 | Discharge: 2019-09-24 | Disposition: A | Discharge status: Home / Self Care | Payer: BC Managed Care – PPO | Source: Ambulatory Visit | Attending: Nephrology | Admitting: Nephrology

## 2019-09-24 DIAGNOSIS — N185 Chronic kidney disease, stage 5: Secondary | ICD-10-CM | POA: Diagnosis not present

## 2019-09-24 DIAGNOSIS — D631 Anemia in chronic kidney disease: Secondary | ICD-10-CM | POA: Insufficient documentation

## 2019-09-24 LAB — IRON AND TIBC
Iron: 75 ug/dL (ref 28–170)
Saturation Ratios: 31 % (ref 10.4–31.8)
TIBC: 244 ug/dL — ABNORMAL LOW (ref 250–450)
UIBC: 169 ug/dL

## 2019-09-24 LAB — POCT HEMOGLOBIN-HEMACUE: Hemoglobin: 10.8 g/dL — ABNORMAL LOW (ref 12.0–15.0)

## 2019-09-24 LAB — FERRITIN: Ferritin: 530 ng/mL — ABNORMAL HIGH (ref 11–307)

## 2019-09-24 MED ORDER — EPOETIN ALFA-EPBX 2000 UNIT/ML IJ SOLN
5000.0000 [IU] | Freq: Once | INTRAMUSCULAR | Status: AC
  Administered 2019-09-24: 5000 [IU] via SUBCUTANEOUS

## 2019-09-24 MED ORDER — EPOETIN ALFA-EPBX 3000 UNIT/ML IJ SOLN
INTRAMUSCULAR | Status: AC
  Filled 2019-09-24: qty 1

## 2019-09-24 MED ORDER — EPOETIN ALFA-EPBX 2000 UNIT/ML IJ SOLN
INTRAMUSCULAR | Status: AC
  Filled 2019-09-24: qty 1

## 2019-10-22 ENCOUNTER — Ambulatory Visit (HOSPITAL_COMMUNITY)
Admission: RE | Admit: 2019-10-22 | Discharge: 2019-10-22 | Disposition: A | Discharge status: Home / Self Care | Payer: BC Managed Care – PPO | Source: Ambulatory Visit | Attending: Nephrology | Admitting: Nephrology

## 2019-10-22 DIAGNOSIS — D631 Anemia in chronic kidney disease: Secondary | ICD-10-CM | POA: Insufficient documentation

## 2019-10-22 DIAGNOSIS — N185 Chronic kidney disease, stage 5: Secondary | ICD-10-CM | POA: Diagnosis present

## 2019-10-22 LAB — IRON AND TIBC
Iron: 77 ug/dL (ref 28–170)
Saturation Ratios: 30 % (ref 10.4–31.8)
TIBC: 259 ug/dL (ref 250–450)
UIBC: 182 ug/dL

## 2019-10-22 LAB — POCT HEMOGLOBIN-HEMACUE: Hemoglobin: 10.3 g/dL — ABNORMAL LOW (ref 12.0–15.0)

## 2019-10-22 LAB — FERRITIN: Ferritin: 591 ng/mL — ABNORMAL HIGH (ref 11–307)

## 2019-10-22 MED ORDER — EPOETIN ALFA-EPBX 2000 UNIT/ML IJ SOLN
INTRAMUSCULAR | Status: AC
  Administered 2019-10-22: 2000 [IU] via SUBCUTANEOUS
  Filled 2019-10-22: qty 1

## 2019-10-22 MED ORDER — EPOETIN ALFA-EPBX 10000 UNIT/ML IJ SOLN: 5000.0000 [IU] | Freq: Once | INTRAMUSCULAR | Status: DC

## 2019-10-22 MED ORDER — EPOETIN ALFA-EPBX 3000 UNIT/ML IJ SOLN
INTRAMUSCULAR | Status: AC
  Administered 2019-10-22: 3000 [IU] via SUBCUTANEOUS
  Filled 2019-10-22: qty 1

## 2019-11-19 ENCOUNTER — Ambulatory Visit (HOSPITAL_COMMUNITY)
Admission: RE | Admit: 2019-11-19 | Discharge: 2019-11-19 | Disposition: A | Discharge status: Home / Self Care | Payer: BC Managed Care – PPO | Source: Ambulatory Visit | Attending: Nephrology | Admitting: Nephrology

## 2019-11-19 ENCOUNTER — Other Ambulatory Visit: Payer: Self-pay

## 2019-11-19 DIAGNOSIS — N185 Chronic kidney disease, stage 5: Secondary | ICD-10-CM | POA: Insufficient documentation

## 2019-11-19 DIAGNOSIS — D631 Anemia in chronic kidney disease: Secondary | ICD-10-CM | POA: Diagnosis not present

## 2019-11-19 LAB — IRON AND TIBC
Iron: 86 ug/dL (ref 28–170)
Saturation Ratios: 33 % — ABNORMAL HIGH (ref 10.4–31.8)
TIBC: 259 ug/dL (ref 250–450)
UIBC: 173 ug/dL

## 2019-11-19 LAB — FERRITIN: Ferritin: 517 ng/mL — ABNORMAL HIGH (ref 11–307)

## 2019-11-19 LAB — POCT HEMOGLOBIN-HEMACUE: Hemoglobin: 10.1 g/dL — ABNORMAL LOW (ref 12.0–15.0)

## 2019-11-19 MED ORDER — EPOETIN ALFA-EPBX 2000 UNIT/ML IJ SOLN
INTRAMUSCULAR | Status: AC
  Filled 2019-11-19: qty 1

## 2019-11-19 MED ORDER — EPOETIN ALFA-EPBX 10000 UNIT/ML IJ SOLN: 5000.0000 [IU] | Freq: Once | INTRAMUSCULAR | Status: DC

## 2019-11-19 MED ORDER — EPOETIN ALFA-EPBX 3000 UNIT/ML IJ SOLN
INTRAMUSCULAR | Status: AC
  Administered 2019-11-19: 3000 [IU] via SUBCUTANEOUS
  Filled 2019-11-19: qty 1

## 2019-12-17 ENCOUNTER — Ambulatory Visit (HOSPITAL_COMMUNITY)
Admission: RE | Admit: 2019-12-17 | Discharge: 2019-12-17 | Disposition: A | Discharge status: Home / Self Care | Payer: BC Managed Care – PPO | Source: Ambulatory Visit | Attending: Nephrology | Admitting: Nephrology

## 2019-12-17 ENCOUNTER — Other Ambulatory Visit: Payer: Self-pay

## 2019-12-17 DIAGNOSIS — D631 Anemia in chronic kidney disease: Secondary | ICD-10-CM | POA: Insufficient documentation

## 2019-12-17 DIAGNOSIS — N185 Chronic kidney disease, stage 5: Secondary | ICD-10-CM | POA: Insufficient documentation

## 2019-12-17 LAB — IRON AND TIBC
Iron: 79 ug/dL (ref 28–170)
Saturation Ratios: 31 % (ref 10.4–31.8)
TIBC: 253 ug/dL (ref 250–450)
UIBC: 174 ug/dL

## 2019-12-17 LAB — FERRITIN: Ferritin: 466 ng/mL — ABNORMAL HIGH (ref 11–307)

## 2019-12-17 LAB — POCT HEMOGLOBIN-HEMACUE: Hemoglobin: 10.6 g/dL — ABNORMAL LOW (ref 12.0–15.0)

## 2019-12-17 MED ORDER — EPOETIN ALFA-EPBX 2000 UNIT/ML IJ SOLN
INTRAMUSCULAR | Status: AC
  Filled 2019-12-17: qty 1

## 2019-12-17 MED ORDER — EPOETIN ALFA-EPBX 10000 UNIT/ML IJ SOLN
5000.0000 [IU] | Freq: Once | INTRAMUSCULAR | Status: DC
  Administered 2019-12-17: 5000 [IU] via SUBCUTANEOUS

## 2019-12-17 MED ORDER — EPOETIN ALFA-EPBX 3000 UNIT/ML IJ SOLN
INTRAMUSCULAR | Status: AC
  Filled 2019-12-17: qty 1

## 2020-01-14 ENCOUNTER — Other Ambulatory Visit: Payer: Self-pay

## 2020-01-14 ENCOUNTER — Ambulatory Visit (HOSPITAL_COMMUNITY)
Admission: RE | Admit: 2020-01-14 | Discharge: 2020-01-14 | Disposition: A | Discharge status: Home / Self Care | Payer: BC Managed Care – PPO | Source: Ambulatory Visit | Attending: Nephrology | Admitting: Nephrology

## 2020-01-14 DIAGNOSIS — D631 Anemia in chronic kidney disease: Secondary | ICD-10-CM | POA: Diagnosis not present

## 2020-01-14 DIAGNOSIS — N185 Chronic kidney disease, stage 5: Secondary | ICD-10-CM | POA: Insufficient documentation

## 2020-01-14 LAB — IRON AND TIBC
Iron: 73 ug/dL (ref 28–170)
Saturation Ratios: 32 % — ABNORMAL HIGH (ref 10.4–31.8)
TIBC: 231 ug/dL — ABNORMAL LOW (ref 250–450)
UIBC: 158 ug/dL

## 2020-01-14 LAB — FERRITIN: Ferritin: 641 ng/mL — ABNORMAL HIGH (ref 11–307)

## 2020-01-14 LAB — POCT HEMOGLOBIN-HEMACUE: Hemoglobin: 10.4 g/dL — ABNORMAL LOW (ref 12.0–15.0)

## 2020-01-14 MED ORDER — EPOETIN ALFA-EPBX 3000 UNIT/ML IJ SOLN
INTRAMUSCULAR | Status: AC
  Administered 2020-01-14: 3000 [IU] via SUBCUTANEOUS
  Filled 2020-01-14: qty 1

## 2020-01-14 MED ORDER — EPOETIN ALFA-EPBX 10000 UNIT/ML IJ SOLN: 5000.0000 [IU] | Freq: Once | INTRAMUSCULAR | Status: DC

## 2020-01-14 MED ORDER — EPOETIN ALFA-EPBX 2000 UNIT/ML IJ SOLN
INTRAMUSCULAR | Status: AC
  Administered 2020-01-14: 2000 [IU] via SUBCUTANEOUS
  Filled 2020-01-14: qty 1

## 2020-02-11 ENCOUNTER — Encounter (HOSPITAL_COMMUNITY): Payer: BC Managed Care – PPO

## 2020-02-18 ENCOUNTER — Other Ambulatory Visit: Payer: Self-pay

## 2020-02-18 ENCOUNTER — Ambulatory Visit (HOSPITAL_COMMUNITY)
Admission: RE | Admit: 2020-02-18 | Discharge: 2020-02-18 | Disposition: A | Discharge status: Home / Self Care | Payer: BC Managed Care – PPO | Source: Ambulatory Visit | Attending: Nephrology | Admitting: Nephrology

## 2020-02-18 DIAGNOSIS — N185 Chronic kidney disease, stage 5: Secondary | ICD-10-CM | POA: Diagnosis present

## 2020-02-18 DIAGNOSIS — D631 Anemia in chronic kidney disease: Secondary | ICD-10-CM | POA: Diagnosis not present

## 2020-02-18 LAB — IRON AND TIBC
Iron: 82 ug/dL (ref 28–170)
Saturation Ratios: 31 % (ref 10.4–31.8)
TIBC: 260 ug/dL (ref 250–450)
UIBC: 178 ug/dL

## 2020-02-18 LAB — FERRITIN: Ferritin: 497 ng/mL — ABNORMAL HIGH (ref 11–307)

## 2020-02-18 LAB — POCT HEMOGLOBIN-HEMACUE: Hemoglobin: 9.9 g/dL — ABNORMAL LOW (ref 12.0–15.0)

## 2020-02-18 MED ORDER — EPOETIN ALFA-EPBX 3000 UNIT/ML IJ SOLN
INTRAMUSCULAR | Status: AC
  Administered 2020-02-18: 3000 [IU] via SUBCUTANEOUS
  Filled 2020-02-18: qty 1

## 2020-02-18 MED ORDER — EPOETIN ALFA-EPBX 10000 UNIT/ML IJ SOLN: 5000.0000 [IU] | Freq: Once | INTRAMUSCULAR | Status: DC

## 2020-02-18 MED ORDER — EPOETIN ALFA-EPBX 2000 UNIT/ML IJ SOLN
INTRAMUSCULAR | Status: AC
  Administered 2020-02-18: 2000 [IU] via SUBCUTANEOUS
  Filled 2020-02-18: qty 1

## 2020-03-17 ENCOUNTER — Other Ambulatory Visit: Payer: Self-pay

## 2020-03-17 ENCOUNTER — Ambulatory Visit (HOSPITAL_COMMUNITY)
Admission: RE | Admit: 2020-03-17 | Discharge: 2020-03-17 | Disposition: A | Discharge status: Home / Self Care | Payer: BC Managed Care – PPO | Source: Ambulatory Visit | Attending: Nephrology | Admitting: Nephrology

## 2020-03-17 VITALS — BP 148/63 | HR 75 | Temp 97.5°F | Resp 18

## 2020-03-17 DIAGNOSIS — Z940 Kidney transplant status: Secondary | ICD-10-CM | POA: Insufficient documentation

## 2020-03-17 LAB — POCT HEMOGLOBIN-HEMACUE: Hemoglobin: 10.1 g/dL — ABNORMAL LOW (ref 12.0–15.0)

## 2020-03-17 LAB — IRON AND TIBC
Iron: 77 ug/dL (ref 28–170)
Saturation Ratios: 32 % — ABNORMAL HIGH (ref 10.4–31.8)
TIBC: 244 ug/dL — ABNORMAL LOW (ref 250–450)
UIBC: 167 ug/dL

## 2020-03-17 LAB — FERRITIN: Ferritin: 450 ng/mL — ABNORMAL HIGH (ref 11–307)

## 2020-03-17 MED ORDER — EPOETIN ALFA-EPBX 3000 UNIT/ML IJ SOLN
INTRAMUSCULAR | Status: AC
  Administered 2020-03-17: 3000 [IU] via SUBCUTANEOUS
  Filled 2020-03-17: qty 1

## 2020-03-17 MED ORDER — EPOETIN ALFA-EPBX 2000 UNIT/ML IJ SOLN
INTRAMUSCULAR | Status: AC
  Administered 2020-03-17: 2000 [IU] via SUBCUTANEOUS
  Filled 2020-03-17: qty 1

## 2020-03-17 MED ORDER — EPOETIN ALFA-EPBX 10000 UNIT/ML IJ SOLN: 5000.0000 [IU] | INTRAMUSCULAR | Status: DC

## 2020-03-31 ENCOUNTER — Other Ambulatory Visit: Payer: Self-pay

## 2020-03-31 ENCOUNTER — Ambulatory Visit (HOSPITAL_COMMUNITY)
Admission: RE | Admit: 2020-03-31 | Discharge: 2020-03-31 | Disposition: A | Discharge status: Home / Self Care | Payer: BC Managed Care – PPO | Source: Ambulatory Visit | Attending: Nephrology | Admitting: Nephrology

## 2020-03-31 VITALS — BP 173/68 | HR 71 | Temp 97.1°F | Resp 18

## 2020-03-31 DIAGNOSIS — Z940 Kidney transplant status: Secondary | ICD-10-CM

## 2020-03-31 MED ORDER — EPOETIN ALFA-EPBX 10000 UNIT/ML IJ SOLN: 5000.0000 [IU] | INTRAMUSCULAR | Status: DC

## 2020-03-31 MED ORDER — EPOETIN ALFA-EPBX 3000 UNIT/ML IJ SOLN
INTRAMUSCULAR | Status: AC
  Administered 2020-03-31: 3000 [IU] via SUBCUTANEOUS
  Filled 2020-03-31: qty 1

## 2020-03-31 MED ORDER — EPOETIN ALFA-EPBX 2000 UNIT/ML IJ SOLN
INTRAMUSCULAR | Status: AC
  Administered 2020-03-31: 2000 [IU] via SUBCUTANEOUS
  Filled 2020-03-31: qty 1

## 2020-04-04 LAB — POCT HEMOGLOBIN-HEMACUE: Hemoglobin: 10.5 g/dL — ABNORMAL LOW (ref 12.0–15.0)

## 2020-04-14 ENCOUNTER — Encounter (HOSPITAL_COMMUNITY): Payer: BC Managed Care – PPO

## 2020-04-14 ENCOUNTER — Other Ambulatory Visit: Payer: Self-pay

## 2020-04-14 ENCOUNTER — Encounter (HOSPITAL_COMMUNITY)
Admission: RE | Admit: 2020-04-14 | Discharge: 2020-04-14 | Disposition: A | Discharge status: Home / Self Care | Payer: BC Managed Care – PPO | Source: Ambulatory Visit | Attending: Nephrology | Admitting: Nephrology

## 2020-04-14 VITALS — BP 157/67 | HR 66 | Temp 97.8°F | Resp 18

## 2020-04-14 DIAGNOSIS — Z940 Kidney transplant status: Secondary | ICD-10-CM | POA: Diagnosis not present

## 2020-04-14 LAB — IRON AND TIBC
Iron: 68 ug/dL (ref 28–170)
Saturation Ratios: 28 % (ref 10.4–31.8)
TIBC: 242 ug/dL — ABNORMAL LOW (ref 250–450)
UIBC: 174 ug/dL

## 2020-04-14 LAB — POCT HEMOGLOBIN-HEMACUE: Hemoglobin: 10.5 g/dL — ABNORMAL LOW (ref 12.0–15.0)

## 2020-04-14 LAB — FERRITIN: Ferritin: 417 ng/mL — ABNORMAL HIGH (ref 11–307)

## 2020-04-14 MED ORDER — EPOETIN ALFA-EPBX 3000 UNIT/ML IJ SOLN
INTRAMUSCULAR | Status: AC
  Administered 2020-04-14: 3000 [IU] via SUBCUTANEOUS
  Filled 2020-04-14: qty 1

## 2020-04-14 MED ORDER — EPOETIN ALFA-EPBX 2000 UNIT/ML IJ SOLN
INTRAMUSCULAR | Status: AC
  Administered 2020-04-14: 2000 [IU] via SUBCUTANEOUS
  Filled 2020-04-14: qty 1

## 2020-04-14 MED ORDER — EPOETIN ALFA-EPBX 10000 UNIT/ML IJ SOLN: 5000.0000 [IU] | INTRAMUSCULAR | Status: DC

## 2020-04-28 ENCOUNTER — Other Ambulatory Visit: Payer: Self-pay

## 2020-04-28 ENCOUNTER — Encounter (HOSPITAL_COMMUNITY)
Admission: RE | Admit: 2020-04-28 | Discharge: 2020-04-28 | Disposition: A | Discharge status: Home / Self Care | Payer: BC Managed Care – PPO | Source: Ambulatory Visit | Attending: Nephrology | Admitting: Nephrology

## 2020-04-28 VITALS — BP 178/85 | HR 63 | Resp 18

## 2020-04-28 DIAGNOSIS — Z940 Kidney transplant status: Secondary | ICD-10-CM

## 2020-04-28 LAB — POCT HEMOGLOBIN-HEMACUE: Hemoglobin: 10.8 g/dL — ABNORMAL LOW (ref 12.0–15.0)

## 2020-04-28 MED ORDER — EPOETIN ALFA-EPBX 2000 UNIT/ML IJ SOLN
INTRAMUSCULAR | Status: AC
  Filled 2020-04-28: qty 1

## 2020-04-28 MED ORDER — EPOETIN ALFA-EPBX 10000 UNIT/ML IJ SOLN
5000.0000 [IU] | INTRAMUSCULAR | Status: DC
  Administered 2020-04-28: 5000 [IU] via SUBCUTANEOUS

## 2020-04-28 MED ORDER — EPOETIN ALFA-EPBX 3000 UNIT/ML IJ SOLN
INTRAMUSCULAR | Status: AC
  Filled 2020-04-28: qty 1

## 2020-05-12 ENCOUNTER — Other Ambulatory Visit: Payer: Self-pay

## 2020-05-12 ENCOUNTER — Encounter (HOSPITAL_COMMUNITY)
Admission: RE | Admit: 2020-05-12 | Discharge: 2020-05-12 | Disposition: A | Discharge status: Home / Self Care | Payer: BC Managed Care – PPO | Source: Ambulatory Visit | Attending: Nephrology | Admitting: Nephrology

## 2020-05-12 VITALS — BP 161/70 | HR 72 | Temp 97.3°F | Resp 18

## 2020-05-12 DIAGNOSIS — Z940 Kidney transplant status: Secondary | ICD-10-CM | POA: Insufficient documentation

## 2020-05-12 LAB — IRON AND TIBC
Iron: 67 ug/dL (ref 28–170)
Saturation Ratios: 30 % (ref 10.4–31.8)
TIBC: 225 ug/dL — ABNORMAL LOW (ref 250–450)
UIBC: 158 ug/dL

## 2020-05-12 LAB — FERRITIN: Ferritin: 388 ng/mL — ABNORMAL HIGH (ref 11–307)

## 2020-05-12 LAB — POCT HEMOGLOBIN-HEMACUE: Hemoglobin: 10.2 g/dL — ABNORMAL LOW (ref 12.0–15.0)

## 2020-05-12 MED ORDER — EPOETIN ALFA-EPBX 10000 UNIT/ML IJ SOLN: 5000.0000 [IU] | INTRAMUSCULAR | Status: DC

## 2020-05-12 MED ORDER — EPOETIN ALFA-EPBX 2000 UNIT/ML IJ SOLN
INTRAMUSCULAR | Status: AC
  Administered 2020-05-12: 2000 [IU] via SUBCUTANEOUS
  Filled 2020-05-12: qty 1

## 2020-05-12 MED ORDER — EPOETIN ALFA-EPBX 3000 UNIT/ML IJ SOLN
INTRAMUSCULAR | Status: AC
  Administered 2020-05-12: 3000 [IU] via SUBCUTANEOUS
  Filled 2020-05-12: qty 1

## 2020-05-26 ENCOUNTER — Encounter (HOSPITAL_COMMUNITY): Payer: BC Managed Care – PPO

## 2020-06-02 ENCOUNTER — Other Ambulatory Visit: Payer: Self-pay

## 2020-06-02 ENCOUNTER — Encounter (HOSPITAL_COMMUNITY)
Admission: RE | Admit: 2020-06-02 | Discharge: 2020-06-02 | Disposition: A | Discharge status: Home / Self Care | Payer: BC Managed Care – PPO | Source: Ambulatory Visit | Attending: Nephrology | Admitting: Nephrology

## 2020-06-02 VITALS — BP 154/65 | HR 75 | Temp 97.5°F | Resp 18

## 2020-06-02 DIAGNOSIS — Z940 Kidney transplant status: Secondary | ICD-10-CM | POA: Diagnosis present

## 2020-06-02 LAB — POCT HEMOGLOBIN-HEMACUE: Hemoglobin: 10.5 g/dL — ABNORMAL LOW (ref 12.0–15.0)

## 2020-06-02 MED ORDER — EPOETIN ALFA-EPBX 10000 UNIT/ML IJ SOLN: 5000.0000 [IU] | INTRAMUSCULAR | Status: DC

## 2020-06-02 MED ORDER — EPOETIN ALFA-EPBX 3000 UNIT/ML IJ SOLN
INTRAMUSCULAR | Status: AC
  Filled 2020-06-02: qty 1

## 2020-06-02 MED ORDER — EPOETIN ALFA-EPBX 2000 UNIT/ML IJ SOLN
INTRAMUSCULAR | Status: AC
  Administered 2020-06-02: 5000 [IU]
  Filled 2020-06-02: qty 1

## 2020-06-16 ENCOUNTER — Encounter (HOSPITAL_COMMUNITY): Payer: BC Managed Care – PPO

## 2020-06-23 ENCOUNTER — Encounter (HOSPITAL_COMMUNITY)
Admission: RE | Admit: 2020-06-23 | Discharge: 2020-06-23 | Disposition: A | Discharge status: Home / Self Care | Payer: BC Managed Care – PPO | Source: Ambulatory Visit | Attending: Nephrology | Admitting: Nephrology

## 2020-06-23 ENCOUNTER — Other Ambulatory Visit: Payer: Self-pay

## 2020-06-23 VITALS — BP 165/72 | HR 74 | Temp 97.2°F | Resp 18

## 2020-06-23 DIAGNOSIS — Z940 Kidney transplant status: Secondary | ICD-10-CM

## 2020-06-23 LAB — POCT HEMOGLOBIN-HEMACUE: Hemoglobin: 10.1 g/dL — ABNORMAL LOW (ref 12.0–15.0)

## 2020-06-23 MED ORDER — EPOETIN ALFA-EPBX 3000 UNIT/ML IJ SOLN
INTRAMUSCULAR | Status: AC
  Administered 2020-06-23: 3000 [IU] via SUBCUTANEOUS
  Filled 2020-06-23: qty 1

## 2020-06-23 MED ORDER — EPOETIN ALFA-EPBX 2000 UNIT/ML IJ SOLN
INTRAMUSCULAR | Status: AC
  Administered 2020-06-23: 2000 [IU] via SUBCUTANEOUS
  Filled 2020-06-23: qty 1

## 2020-06-23 MED ORDER — EPOETIN ALFA-EPBX 10000 UNIT/ML IJ SOLN: 5000.0000 [IU] | INTRAMUSCULAR | Status: DC

## 2020-07-07 ENCOUNTER — Encounter (HOSPITAL_COMMUNITY): Payer: BC Managed Care – PPO

## 2020-07-14 ENCOUNTER — Encounter (HOSPITAL_COMMUNITY): Payer: BC Managed Care – PPO

## 2020-07-21 ENCOUNTER — Ambulatory Visit (HOSPITAL_COMMUNITY)
Admission: RE | Admit: 2020-07-21 | Discharge: 2020-07-21 | Disposition: A | Discharge status: Home / Self Care | Payer: BC Managed Care – PPO | Source: Ambulatory Visit | Attending: Nephrology | Admitting: Nephrology

## 2020-07-21 ENCOUNTER — Other Ambulatory Visit: Payer: Self-pay

## 2020-07-21 VITALS — BP 161/57 | HR 69 | Temp 97.5°F | Resp 18

## 2020-07-21 DIAGNOSIS — Z940 Kidney transplant status: Secondary | ICD-10-CM | POA: Diagnosis not present

## 2020-07-21 LAB — IRON AND TIBC
Iron: 53 ug/dL (ref 28–170)
Saturation Ratios: 21 % (ref 10.4–31.8)
TIBC: 249 ug/dL — ABNORMAL LOW (ref 250–450)
UIBC: 196 ug/dL

## 2020-07-21 LAB — FERRITIN: Ferritin: 436 ng/mL — ABNORMAL HIGH (ref 11–307)

## 2020-07-21 LAB — POCT HEMOGLOBIN-HEMACUE: Hemoglobin: 9.7 g/dL — ABNORMAL LOW (ref 12.0–15.0)

## 2020-07-21 MED ORDER — EPOETIN ALFA-EPBX 10000 UNIT/ML IJ SOLN: 5000.0000 [IU] | INTRAMUSCULAR | Status: DC

## 2020-07-21 MED ORDER — EPOETIN ALFA-EPBX 3000 UNIT/ML IJ SOLN
INTRAMUSCULAR | Status: AC
  Administered 2020-07-21: 3000 [IU]
  Filled 2020-07-21: qty 1

## 2020-07-21 MED ORDER — EPOETIN ALFA-EPBX 2000 UNIT/ML IJ SOLN
INTRAMUSCULAR | Status: AC
  Administered 2020-07-21: 2000 [IU]
  Filled 2020-07-21: qty 1

## 2020-07-28 ENCOUNTER — Encounter (HOSPITAL_COMMUNITY): Payer: BC Managed Care – PPO

## 2020-08-04 ENCOUNTER — Ambulatory Visit (HOSPITAL_COMMUNITY)
Admission: RE | Admit: 2020-08-04 | Discharge: 2020-08-04 | Disposition: A | Discharge status: Home / Self Care | Payer: BC Managed Care – PPO | Source: Ambulatory Visit | Attending: Nephrology | Admitting: Nephrology

## 2020-08-04 ENCOUNTER — Other Ambulatory Visit: Payer: Self-pay

## 2020-08-04 ENCOUNTER — Encounter: Payer: Self-pay | Admitting: Neurology

## 2020-08-04 VITALS — BP 152/64 | HR 66 | Temp 97.2°F | Resp 18

## 2020-08-04 DIAGNOSIS — Z940 Kidney transplant status: Secondary | ICD-10-CM

## 2020-08-04 LAB — POCT HEMOGLOBIN-HEMACUE: Hemoglobin: 9.8 g/dL — ABNORMAL LOW (ref 12.0–15.0)

## 2020-08-04 MED ORDER — EPOETIN ALFA-EPBX 10000 UNIT/ML IJ SOLN: 5000.0000 [IU] | INTRAMUSCULAR | Status: DC

## 2020-08-04 MED ORDER — EPOETIN ALFA-EPBX 2000 UNIT/ML IJ SOLN
INTRAMUSCULAR | Status: AC
  Filled 2020-08-04: qty 1

## 2020-08-04 MED ORDER — EPOETIN ALFA-EPBX 3000 UNIT/ML IJ SOLN
INTRAMUSCULAR | Status: AC
  Filled 2020-08-04: qty 1

## 2020-08-16 ENCOUNTER — Other Ambulatory Visit: Payer: Self-pay

## 2020-08-16 ENCOUNTER — Ambulatory Visit (INDEPENDENT_AMBULATORY_CARE_PROVIDER_SITE_OTHER): Payer: BC Managed Care – PPO | Admitting: Neurology

## 2020-08-16 ENCOUNTER — Encounter: Payer: Self-pay | Admitting: Neurology

## 2020-08-16 VITALS — BP 168/69 | HR 74 | Resp 20 | Ht 60.0 in | Wt 136.0 lb

## 2020-08-16 DIAGNOSIS — R569 Unspecified convulsions: Secondary | ICD-10-CM

## 2020-08-16 NOTE — Progress Notes (Signed)
NEUROLOGY CONSULTATION NOTE  Karen [name] MRN: [id number] DOB: 07-21-1955  Referring provider: Dr. Edrick Oh Primary care provider: Dr. Edrick Oh  Reason for consult:  seizure  Dear Dr Justin Mend:  Thank you for your kind referral of Karen [name] for consultation of the above symptoms. Although her history is well known to you, please allow me to reiterate it for the purpose of our medical record. She is alone in the office today. Records and images were personally reviewed where available.   HISTORY OF PRESENT ILLNESS: This is a very pleasant 65 year old right-handed woman with a history of hypertension, hyperlipidemia, ESRD s/p renal transplant (left in 2005, right in 2011) on Prograf, Cellcept, and Prednisone, presenting for evaluation of seizure. She was previously seen by neurologist Dr. Jannifer Franklin in 2014 for [facility] clearance for driving after she had a seizure in the setting of PRES in 2008. At that time, she reports her BP spiked and she could not move her right arm. She recalls feeling strange then blacking out. Brain MRI in 2008 showed diffusion abnormalities in left occipital and parietal lobe, etiology possibly subacute infarct versus PRES. Reportedly EEG at that was normal. She was initially placed on Dilantin which was stopped soon after. She has not been taking any seizure medication for over 10 years and denies any seizures or seizure-like symptoms until 07/31/2020. She woke up feeling tired and did not feel well. She was nauseated and vomited, she snacked during the day but did not eat any full meal. She lay on the couch mostly during the day, then at 9pm while sleeping on the couch, all of a sudden she woke up and her right arm was extended and shaking, she was unable to control it. She felt her eyes blinking repeatedly. In her mind she tried to reach for her phone and tell herself to calm down. When she shaking subsided, she noticed she had urinary incontinence and had a large  abrasion on the left cheek from hitting the arm rest. She recalls sitting up on the couch then going to bed, no focal weakness. She went to work the next day feeling tired. She denies any sleep deprivation or alcohol use. She has been having recent BP spikes again (SBP 180) and was started on another antihypertensive 1.5 months ago, no other new medications.   She lives alone and denies any staring/unresponsive episodes, gaps in time, olfactory/gustatory hallucinations, deja vu, rising epigastric sensation, focal numbness/tingling/weakness, myoclonic jerks. She denies any headaches, vision changes, dysarthria/dysphagia, neck/back pain, bowel/bladder dysfunction. She has occasional lightheadedness. She tripped on a curb a few months ago. She notes some memory changes "due to age," denies missing medications or getting lost driving. She works for [employer]. She had a normal birth and early development.  There is no history of febrile convulsions, CNS infections such as meningitis/encephalitis, significant traumatic brain injury, neurosurgical procedures. She is not sure if her mother had seizures, but appears her mother had a shunt.  Prior ASMs: Dilantin   PAST MEDICAL HISTORY: Past Medical History:  Diagnosis Date  . Anemia associated with chronic renal failure   . Arthritis   . Convulsions/seizures ([place]) 01/28/2013  . Dyslipidemia   . GERD (gastroesophageal reflux disease)   . Hyperlipidemia   . Hypertension   . Kidney transplanted   . Seizure disorder ([place])     PAST SURGICAL HISTORY: Past Surgical History:  Procedure Laterality Date  . CATHETER REMOVAL    . COLONOSCOPY    .  GALLBLADDER SURGERY    . KIDNEY TRANSPLANT     04/10/2009    MEDICATIONS: Current Outpatient Medications on File Prior to Visit  Medication Sig Dispense Refill  . amLODipine (NORVASC) 10 MG tablet Take 10 mg by mouth daily.    Marland Kitchen aspirin EC 81 MG tablet Take 81 mg by mouth daily.    . calcitRIOL  (ROCALTROL) 0.25 MCG capsule Take 0.25 mcg by mouth daily. Mon,Wed Fri    . Epoetin Alfa-epbx (RETACRIT IJ) Inject as directed.    . hydrALAZINE (APRESOLINE) 25 MG tablet Take 25 mg by mouth 2 (two) times daily.    Marland Kitchen labetalol (NORMODYNE) 100 MG tablet Take 100 mg by mouth 2 (two) times daily.    Marland Kitchen losartan (COZAAR) 25 MG tablet Take 25 mg by mouth daily.    . magnesium oxide (MAG-OX) 400 MG tablet Take 400 mg by mouth 2 (two) times daily.     . mycophenolate (CELLCEPT) 250 MG capsule Take 500 mg by mouth 2 (two) times daily.     Marland Kitchen omeprazole (PRILOSEC) 20 MG capsule Take 20 mg by mouth daily.    . ondansetron (ZOFRAN) 4 MG tablet Take 1 tablet (4 mg total) by mouth every 8 (eight) hours as needed for nausea. 10 tablet 0  . oxyCODONE-acetaminophen (PERCOCET/ROXICET) 5-325 MG per tablet May take 1 or 2 tablets PO q 4-6 hours for severe pain - Do not take with Tylenol as this tablet already contains tylenol 5 tablet 0  . predniSONE (DELTASONE) 5 MG tablet Take 5 mg by mouth daily.    . simvastatin (ZOCOR) 20 MG tablet Take 20 mg by mouth every evening.    . sulfamethoxazole-trimethoprim (BACTRIM,SEPTRA) 400-80 MG per tablet Take 1 tablet by mouth 3 (three) times a week. Takes on Monday, Wednesday and Friday.    . tacrolimus (PROGRAF) 1 MG capsule Take 4 mg by mouth 2 (two) times daily.    . VENTOLIN HFA 108 (90 Base) MCG/ACT inhaler Inhale 108 puffs into the lungs 3 times/day as needed-between meals & bedtime.     No current facility-administered medications on file prior to visit.    ALLERGIES: No Known Allergies  FAMILY HISTORY: Family History  Problem Relation Age of Onset  . Stroke Mother   . Seizures Mother   . Migraines Mother   . Diabetes Brother   . Colon cancer Neg Hx   . Esophageal cancer Neg Hx   . Stomach cancer Neg Hx   . Rectal cancer Neg Hx     SOCIAL HISTORY: Social History   Socioeconomic History  . Marital status: Single    Spouse name: Not on file  . Number  of children: 1  . Years of education: 106  . Highest education level: Not on file  Occupational History  . Occupation: CNA  Tobacco Use  . Smoking status: Former Smoker    Types: Cigarettes  . Smokeless tobacco: Never Used  . Tobacco comment: quit smoking 25 yrs ago  Substance and Sexual Activity  . Alcohol use: No    Comment: quit 25 years ago  . Drug use: No  . Sexual activity: Not on file  Other Topics Concern  . Not on file  Social History Narrative  . Not on file   Social Determinants of Health   Financial Resource Strain: Not on file  Food Insecurity: Not on file  Transportation Needs: Not on file  Physical Activity: Not on file  Stress: Not on file  Social  Connections: Not on file  Intimate Partner Violence: Not on file     PHYSICAL EXAM: Vitals:   08/16/20 1236  BP: (!) 168/69  Pulse: 74  Resp: 20  SpO2: 97%   General: No acute distress Head:  Normocephalic/atraumatic Skin/Extremities: No rash, no edema. AV fistula on right arm Neurological Exam: Mental status: alert and oriented to person, place, and time, no dysarthria or aphasia, Fund of knowledge is appropriate.  Recent and remote memory are impaired, 1/3 delayed recall. Attention and concentration are normal, 4/5 WORLD backwards. Cranial nerves: CN I: not tested CN II: pupils equal, round and reactive to light, visual fields intact CN III, IV, VI:  full range of motion, no nystagmus, no ptosis CN V: facial sensation intact CN VII: upper and lower face symmetric CN VIII: hearing intact to conversation Bulk & Tone: normal, no fasciculations. Motor: 5/5 throughout with no pronator drift. Sensation: intact to light touch, cold, pin, vibration sense.  No extinction to double simultaneous stimulation.  Romberg test negative Deep Tendon Reflexes: +2 throughout except for absent ankle jerks bilaterally, no ankle clonus Plantar responses: downgoing bilaterally Cerebellar: no incoordination on finger to nose  testing Gait: narrow-based and steady, unable to tandem walk Tremor: none   IMPRESSION: This is a very pleasant 65 year old right-handed woman with a history of hypertension, hyperlipidemia, ESRD s/p renal transplant (left in 2005, right in 2011) on Prograf, Cellcept, and Prednisone, presenting for evaluation of seizure. She had PRES in 2008 and had a seizure at that time, seizure-free off medication for over 10 years, until she had an unwitnessed episode last 07/31/20 concerning for focal seizure. MRI brain without contrast and 1-hour EEG will be ordered as part of seizure work-up. We discussed that after an initial seizure, unless there are significant risk factors, an abnormal neurological exam, an EEG showing epileptiform abnormalities, and/or abnormal neuroimaging, treatment with an antiepileptic drug is not indicated. We discussed [place] driving restrictions which indicate a patient needs to free of seizures or events of altered awareness for 6 months prior to resuming driving. The patient agreed to comply with these restrictions.  Seizure precautions were discussed which include no driving, no bathing in a tub, no swimming alone, no cooking over an open flame, no operating dangerous machinery, and no activities which may endanger oneself or someone else. Follow-up after tests, call for any changes.   Thank you for allowing me to participate in the care of this patient. Please do not hesitate to call for any questions or concerns.   Ellouise Newer, M.D.  CC: Dr. Justin Mend

## 2020-08-16 NOTE — Patient Instructions (Signed)
1. Schedule MRI brain without contrast  2. Schedule 1-hour EEG  3. Follow-up after tests, call for any changes   Seizure Precautions: 1. If medication has been prescribed for you to prevent seizures, take it exactly as directed.  Do not stop taking the medicine without talking to your doctor first, even if you have not had a seizure in a long time.   2. Avoid activities in which a seizure would cause danger to yourself or to others.  Don't operate dangerous machinery, swim alone, or climb in high or dangerous places, such as on ladders, roofs, or girders.  Do not drive unless your doctor says you may.  3. If you have any warning that you may have a seizure, lay down in a safe place where you can't hurt yourself.    4.  No driving for 6 months from last seizure, as per [facility].   Please refer to the following link on the [employer] website for more information: http://www.epilepsyfoundation.org/answerplace/Social/driving/drivingu.cfm   5.  Maintain good sleep hygiene.   6.  Contact your doctor if you have any problems that may be related to the medicine you are taking.  7.  Call 911 and bring the patient back to the ED if:        A.  The seizure lasts longer than 5 minutes.       B.  The patient doesn't awaken shortly after the seizure  C.  The patient has new problems such as difficulty seeing, speaking or moving  D.  The patient was injured during the seizure  E.  The patient has a temperature over 102 F (39C)  F.  The patient vomited and now is having trouble breathing

## 2020-08-18 ENCOUNTER — Other Ambulatory Visit: Payer: Self-pay

## 2020-08-18 ENCOUNTER — Ambulatory Visit (HOSPITAL_COMMUNITY)
Admission: RE | Admit: 2020-08-18 | Discharge: 2020-08-18 | Disposition: A | Discharge status: Home / Self Care | Payer: BC Managed Care – PPO | Source: Ambulatory Visit | Attending: Nephrology | Admitting: Nephrology

## 2020-08-18 VITALS — BP 171/76 | HR 87 | Temp 97.4°F | Resp 20

## 2020-08-18 DIAGNOSIS — Z940 Kidney transplant status: Secondary | ICD-10-CM | POA: Insufficient documentation

## 2020-08-18 LAB — POCT HEMOGLOBIN-HEMACUE: Hemoglobin: 10.2 g/dL — ABNORMAL LOW (ref 12.0–15.0)

## 2020-08-18 LAB — IRON AND TIBC
Iron: 62 ug/dL (ref 28–170)
Saturation Ratios: 25 % (ref 10.4–31.8)
TIBC: 251 ug/dL (ref 250–450)
UIBC: 189 ug/dL

## 2020-08-18 LAB — FERRITIN: Ferritin: 339 ng/mL — ABNORMAL HIGH (ref 11–307)

## 2020-08-18 MED ORDER — EPOETIN ALFA-EPBX 2000 UNIT/ML IJ SOLN
INTRAMUSCULAR | Status: AC
  Administered 2020-08-18: 2000 [IU]
  Filled 2020-08-18: qty 1

## 2020-08-18 MED ORDER — EPOETIN ALFA-EPBX 3000 UNIT/ML IJ SOLN
INTRAMUSCULAR | Status: AC
  Administered 2020-08-18: 3000 [IU]
  Filled 2020-08-18: qty 1

## 2020-08-18 MED ORDER — EPOETIN ALFA-EPBX 10000 UNIT/ML IJ SOLN: 5000.0000 [IU] | INTRAMUSCULAR | Status: DC

## 2020-09-01 ENCOUNTER — Encounter (HOSPITAL_COMMUNITY): Payer: BC Managed Care – PPO

## 2020-09-05 ENCOUNTER — Ambulatory Visit (INDEPENDENT_AMBULATORY_CARE_PROVIDER_SITE_OTHER): Payer: BC Managed Care – PPO | Admitting: Neurology

## 2020-09-05 ENCOUNTER — Other Ambulatory Visit: Payer: Self-pay

## 2020-09-05 DIAGNOSIS — R569 Unspecified convulsions: Secondary | ICD-10-CM

## 2020-09-08 ENCOUNTER — Other Ambulatory Visit: Payer: Self-pay

## 2020-09-08 ENCOUNTER — Encounter (HOSPITAL_COMMUNITY)
Admission: RE | Admit: 2020-09-08 | Discharge: 2020-09-08 | Disposition: A | Discharge status: Home / Self Care | Payer: BC Managed Care – PPO | Source: Ambulatory Visit | Attending: Nephrology | Admitting: Nephrology

## 2020-09-08 VITALS — BP 158/62 | HR 68 | Temp 97.2°F | Resp 20

## 2020-09-08 DIAGNOSIS — Z940 Kidney transplant status: Secondary | ICD-10-CM | POA: Insufficient documentation

## 2020-09-08 LAB — POCT HEMOGLOBIN-HEMACUE: Hemoglobin: 9.2 g/dL — ABNORMAL LOW (ref 12.0–15.0)

## 2020-09-08 MED ORDER — EPOETIN ALFA-EPBX 10000 UNIT/ML IJ SOLN
INTRAMUSCULAR | Status: AC
  Administered 2020-09-08: 5000 [IU] via SUBCUTANEOUS
  Filled 2020-09-08: qty 1

## 2020-09-08 MED ORDER — EPOETIN ALFA-EPBX 10000 UNIT/ML IJ SOLN: 5000.0000 [IU] | INTRAMUSCULAR | Status: DC

## 2020-09-12 NOTE — Procedures (Signed)
ELECTROENCEPHALOGRAM REPORT  Date of Study: 09/05/2020  Patient's Name: Karen [name] MRN: [id number] Date of Birth: 15-Jun-1955  Referring Provider: Dr. 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  Medications: NORVASC 10 MG tablet ROCALTROL 0.25 MCG capsule aspirin EC 81 MG tablet RETACRIT IJ APRESOLINE 25 MG tablet NORMODYNE 100 MG tablet COZAAR 25 MG tablet MAG-OX 400 MG tablet CELLCEPT 250 MG capsule PRILOSEC 20 MG capsule ZOFRAN 4 MG tablet PERCOCET/ROXICET 5-325 MG per tablet DELTASONE 5 MG tablet ZOCOR 20 MG tablet BACTRIM,SEPTRA 400-80 MG per tablet PROGRAF 1 MG capsule VENTOLIN HFA 108 (90 Base) MCG/ACT inhaler   Technical Summary: A multichannel digital 1-hour EEG recording measured by the international 10-20 system with electrodes applied with paste and impedances below 5000 ohms performed in our laboratory with EKG monitoring in an awake and asleep patient.  Hyperventilation was not performed. Photic stimulation was performed.  The digital EEG was referentially recorded, reformatted, and digitally filtered in a variety of bipolar and referential montages for optimal display.    Description: The patient is awake and asleep during the recording.  During maximal wakefulness, there is a symmetric, low voltage 8 Hz posterior dominant rhythm that attenuates with eye opening.  The record is symmetric.  During drowsiness and sleep, there is an increase in theta slowing of the background.  Vertex waves and symmetric sleep spindles were seen.  Photic stimulation did not elicit any abnormalities.  There were no epileptiform discharges or electrographic seizures seen.    EKG lead was unremarkable.  Impression: This 1-hour awake and asleep EEG is normal.    Clinical Correlation: A  normal EEG does not exclude a clinical diagnosis of epilepsy.  If further clinical questions remain, prolonged EEG may be helpful.  Clinical correlation is advised.   Ellouise Newer, M.D.

## 2020-09-19 NOTE — Progress Notes (Signed)
Left message to call office  230 06/20/20252

## 2020-09-20 ENCOUNTER — Telehealth: Payer: Self-pay | Admitting: Neurology

## 2020-09-20 NOTE — Telephone Encounter (Signed)
Pt called in to get EEG results

## 2020-09-21 NOTE — Progress Notes (Signed)
Left message for patient to call office at 843am 09/21/2020

## 2020-09-22 ENCOUNTER — Encounter (HOSPITAL_COMMUNITY): Payer: BC Managed Care – PPO

## 2020-09-22 ENCOUNTER — Other Ambulatory Visit: Payer: Self-pay

## 2020-09-22 ENCOUNTER — Telehealth: Payer: Self-pay

## 2020-09-22 ENCOUNTER — Encounter (HOSPITAL_COMMUNITY)
Admission: RE | Admit: 2020-09-22 | Discharge: 2020-09-22 | Disposition: A | Discharge status: Home / Self Care | Payer: BC Managed Care – PPO | Source: Ambulatory Visit | Attending: Nephrology | Admitting: Nephrology

## 2020-09-22 VITALS — BP 160/62 | HR 66 | Temp 97.8°F | Resp 20

## 2020-09-22 DIAGNOSIS — Z940 Kidney transplant status: Secondary | ICD-10-CM

## 2020-09-22 LAB — IRON AND TIBC
Iron: 69 ug/dL (ref 28–170)
Saturation Ratios: 25 % (ref 10.4–31.8)
TIBC: 273 ug/dL (ref 250–450)
UIBC: 204 ug/dL

## 2020-09-22 LAB — FERRITIN: Ferritin: 360 ng/mL — ABNORMAL HIGH (ref 11–307)

## 2020-09-22 LAB — POCT HEMOGLOBIN-HEMACUE: Hemoglobin: 9.3 g/dL — ABNORMAL LOW (ref 12.0–15.0)

## 2020-09-22 MED ORDER — EPOETIN ALFA-EPBX 2000 UNIT/ML IJ SOLN
INTRAMUSCULAR | Status: AC
  Administered 2020-09-22: 2000 [IU] via SUBCUTANEOUS
  Filled 2020-09-22: qty 1

## 2020-09-22 MED ORDER — EPOETIN ALFA-EPBX 10000 UNIT/ML IJ SOLN: 5000.0000 [IU] | INTRAMUSCULAR | Status: DC

## 2020-09-22 MED ORDER — EPOETIN ALFA-EPBX 3000 UNIT/ML IJ SOLN
INTRAMUSCULAR | Status: AC
  Administered 2020-09-22: 3000 [IU] via SUBCUTANEOUS
  Filled 2020-09-22: qty 1

## 2020-09-22 NOTE — Telephone Encounter (Signed)
Pt called and informed that EEG was normal, however it is not like a pregnancy test that is positive or negative, just a snapshot of her brain waves. Proceed with brain MRI asscheduled

## 2020-09-22 NOTE — Telephone Encounter (Signed)
-----   Message from Cameron Sprang, MD sent at 09/14/2020  3:22 PM EDT ----- Pls let her know the EEG was normal, however it is not like a pregnancy test that is positive or negative, just a snapshot of her brain waves. Proceed with brain MRI as scheduled, thanks

## 2020-09-26 ENCOUNTER — Ambulatory Visit
Admission: RE | Admit: 2020-09-26 | Discharge: 2020-09-26 | Disposition: A | Discharge status: Home / Self Care | Payer: BC Managed Care – PPO | Source: Ambulatory Visit | Attending: Neurology | Admitting: Neurology

## 2020-09-29 ENCOUNTER — Encounter (HOSPITAL_COMMUNITY): Payer: BC Managed Care – PPO

## 2020-10-06 ENCOUNTER — Other Ambulatory Visit: Payer: Self-pay

## 2020-10-06 ENCOUNTER — Encounter (HOSPITAL_COMMUNITY): Payer: BC Managed Care – PPO

## 2020-10-06 ENCOUNTER — Encounter (HOSPITAL_COMMUNITY)
Admission: RE | Admit: 2020-10-06 | Discharge: 2020-10-06 | Disposition: A | Discharge status: Home / Self Care | Payer: BC Managed Care – PPO | Source: Ambulatory Visit | Attending: Nephrology | Admitting: Nephrology

## 2020-10-06 VITALS — BP 179/67 | HR 70 | Temp 97.5°F | Resp 19

## 2020-10-06 DIAGNOSIS — Z940 Kidney transplant status: Secondary | ICD-10-CM

## 2020-10-06 MED ORDER — EPOETIN ALFA-EPBX 10000 UNIT/ML IJ SOLN: 5000.0000 [IU] | INTRAMUSCULAR | Status: DC

## 2020-10-06 MED ORDER — EPOETIN ALFA-EPBX 2000 UNIT/ML IJ SOLN
INTRAMUSCULAR | Status: AC
  Administered 2020-10-06: 2000 [IU] via SUBCUTANEOUS
  Filled 2020-10-06: qty 1

## 2020-10-06 MED ORDER — EPOETIN ALFA-EPBX 3000 UNIT/ML IJ SOLN
INTRAMUSCULAR | Status: AC
  Administered 2020-10-06: 3000 [IU] via SUBCUTANEOUS
  Filled 2020-10-06: qty 1

## 2020-10-20 ENCOUNTER — Encounter (HOSPITAL_COMMUNITY)
Admission: RE | Admit: 2020-10-20 | Discharge: 2020-10-20 | Disposition: A | Discharge status: Home / Self Care | Payer: BC Managed Care – PPO | Source: Ambulatory Visit | Attending: Nephrology | Admitting: Nephrology

## 2020-10-20 ENCOUNTER — Other Ambulatory Visit: Payer: Self-pay

## 2020-10-20 VITALS — BP 160/55 | HR 67 | Temp 97.8°F | Resp 18

## 2020-10-20 DIAGNOSIS — Z940 Kidney transplant status: Secondary | ICD-10-CM

## 2020-10-20 LAB — IRON AND TIBC
Iron: 68 ug/dL (ref 28–170)
Saturation Ratios: 24 % (ref 10.4–31.8)
TIBC: 284 ug/dL (ref 250–450)
UIBC: 216 ug/dL

## 2020-10-20 LAB — POCT HEMOGLOBIN-HEMACUE: Hemoglobin: 9.3 g/dL — ABNORMAL LOW (ref 12.0–15.0)

## 2020-10-20 LAB — FERRITIN: Ferritin: 482 ng/mL — ABNORMAL HIGH (ref 11–307)

## 2020-10-20 MED ORDER — EPOETIN ALFA-EPBX 2000 UNIT/ML IJ SOLN
INTRAMUSCULAR | Status: AC
  Administered 2020-10-20: 2000 [IU] via SUBCUTANEOUS
  Filled 2020-10-20: qty 1

## 2020-10-20 MED ORDER — EPOETIN ALFA-EPBX 10000 UNIT/ML IJ SOLN: 5000.0000 [IU] | INTRAMUSCULAR | Status: DC

## 2020-10-20 MED ORDER — EPOETIN ALFA-EPBX 3000 UNIT/ML IJ SOLN
INTRAMUSCULAR | Status: AC
  Administered 2020-10-20: 3000 [IU] via SUBCUTANEOUS
  Filled 2020-10-20: qty 1

## 2020-11-03 ENCOUNTER — Encounter (HOSPITAL_COMMUNITY)
Admission: RE | Admit: 2020-11-03 | Discharge: 2020-11-03 | Disposition: A | Discharge status: Home / Self Care | Payer: BC Managed Care – PPO | Source: Ambulatory Visit | Attending: Nephrology | Admitting: Nephrology

## 2020-11-03 ENCOUNTER — Other Ambulatory Visit: Payer: Self-pay

## 2020-11-03 VITALS — BP 158/55 | HR 67 | Resp 18

## 2020-11-03 DIAGNOSIS — Z940 Kidney transplant status: Secondary | ICD-10-CM | POA: Insufficient documentation

## 2020-11-03 LAB — POCT HEMOGLOBIN-HEMACUE: Hemoglobin: 9 g/dL — ABNORMAL LOW (ref 12.0–15.0)

## 2020-11-03 MED ORDER — EPOETIN ALFA-EPBX 10000 UNIT/ML IJ SOLN: 5000.0000 [IU] | INTRAMUSCULAR | Status: DC

## 2020-11-03 MED ORDER — EPOETIN ALFA-EPBX 10000 UNIT/ML IJ SOLN
INTRAMUSCULAR | Status: AC
  Administered 2020-11-03: 5000 [IU] via SUBCUTANEOUS
  Filled 2020-11-03: qty 1

## 2020-11-17 ENCOUNTER — Encounter (HOSPITAL_COMMUNITY)
Admission: RE | Admit: 2020-11-17 | Discharge: 2020-11-17 | Disposition: A | Discharge status: Home / Self Care | Payer: BC Managed Care – PPO | Source: Ambulatory Visit | Attending: Nephrology | Admitting: Nephrology

## 2020-11-17 ENCOUNTER — Other Ambulatory Visit: Payer: Self-pay

## 2020-11-17 VITALS — BP 139/55 | HR 65 | Temp 97.7°F | Resp 18

## 2020-11-17 DIAGNOSIS — Z940 Kidney transplant status: Secondary | ICD-10-CM

## 2020-11-17 LAB — FERRITIN: Ferritin: 418 ng/mL — ABNORMAL HIGH (ref 11–307)

## 2020-11-17 LAB — IRON AND TIBC
Iron: 59 ug/dL (ref 28–170)
Saturation Ratios: 21 % (ref 10.4–31.8)
TIBC: 276 ug/dL (ref 250–450)
UIBC: 217 ug/dL

## 2020-11-17 LAB — POCT HEMOGLOBIN-HEMACUE: Hemoglobin: 9.2 g/dL — ABNORMAL LOW (ref 12.0–15.0)

## 2020-11-17 MED ORDER — EPOETIN ALFA-EPBX 10000 UNIT/ML IJ SOLN
INTRAMUSCULAR | Status: AC
  Filled 2020-11-17: qty 1

## 2020-11-17 MED ORDER — EPOETIN ALFA-EPBX 10000 UNIT/ML IJ SOLN
10000.0000 [IU] | INTRAMUSCULAR | Status: DC
  Administered 2020-11-17: 10000 [IU] via SUBCUTANEOUS

## 2020-11-24 ENCOUNTER — Encounter (HOSPITAL_COMMUNITY): Payer: BC Managed Care – PPO

## 2020-11-30 ENCOUNTER — Other Ambulatory Visit (HOSPITAL_COMMUNITY): Payer: Self-pay | Admitting: *Deleted

## 2020-12-01 ENCOUNTER — Other Ambulatory Visit: Payer: Self-pay | Admitting: Internal Medicine

## 2020-12-01 ENCOUNTER — Encounter (HOSPITAL_COMMUNITY)
Admission: RE | Admit: 2020-12-01 | Discharge: 2020-12-01 | Disposition: A | Discharge status: Home / Self Care | Payer: BC Managed Care – PPO | Source: Ambulatory Visit | Attending: Nephrology | Admitting: Nephrology

## 2020-12-01 ENCOUNTER — Encounter (HOSPITAL_COMMUNITY): Payer: BC Managed Care – PPO

## 2020-12-01 ENCOUNTER — Other Ambulatory Visit: Payer: Self-pay

## 2020-12-01 VITALS — BP 163/56 | HR 67 | Temp 98.1°F | Resp 17

## 2020-12-01 DIAGNOSIS — Z940 Kidney transplant status: Secondary | ICD-10-CM | POA: Diagnosis not present

## 2020-12-01 DIAGNOSIS — Z1231 Encounter for screening mammogram for malignant neoplasm of breast: Secondary | ICD-10-CM

## 2020-12-01 LAB — POCT HEMOGLOBIN-HEMACUE: Hemoglobin: 9.1 g/dL — ABNORMAL LOW (ref 12.0–15.0)

## 2020-12-01 MED ORDER — EPOETIN ALFA-EPBX 10000 UNIT/ML IJ SOLN: 10000.0000 [IU] | INTRAMUSCULAR | Status: DC

## 2020-12-01 MED ORDER — EPOETIN ALFA-EPBX 10000 UNIT/ML IJ SOLN
INTRAMUSCULAR | Status: AC
  Administered 2020-12-01: 10000 [IU] via SUBCUTANEOUS
  Filled 2020-12-01: qty 1

## 2020-12-15 ENCOUNTER — Encounter (HOSPITAL_COMMUNITY): Payer: BC Managed Care – PPO

## 2020-12-21 ENCOUNTER — Encounter (HOSPITAL_COMMUNITY): Payer: BC Managed Care – PPO

## 2020-12-29 ENCOUNTER — Encounter (HOSPITAL_COMMUNITY)
Admission: RE | Admit: 2020-12-29 | Discharge: 2020-12-29 | Disposition: A | Discharge status: Home / Self Care | Payer: BC Managed Care – PPO | Source: Ambulatory Visit | Attending: Nephrology | Admitting: Nephrology

## 2020-12-29 ENCOUNTER — Other Ambulatory Visit: Payer: Self-pay

## 2020-12-29 VITALS — BP 159/66 | HR 68 | Temp 97.3°F

## 2020-12-29 DIAGNOSIS — Z940 Kidney transplant status: Secondary | ICD-10-CM | POA: Diagnosis not present

## 2020-12-29 LAB — IRON AND TIBC
Iron: 66 ug/dL (ref 28–170)
Saturation Ratios: 24 % (ref 10.4–31.8)
TIBC: 270 ug/dL (ref 250–450)
UIBC: 204 ug/dL

## 2020-12-29 LAB — FERRITIN: Ferritin: 515 ng/mL — ABNORMAL HIGH (ref 11–307)

## 2020-12-29 LAB — POCT HEMOGLOBIN-HEMACUE: Hemoglobin: 8.4 g/dL — ABNORMAL LOW (ref 12.0–15.0)

## 2020-12-29 MED ORDER — EPOETIN ALFA-EPBX 10000 UNIT/ML IJ SOLN
10000.0000 [IU] | INTRAMUSCULAR | Status: DC
  Administered 2020-12-29: 10000 [IU] via SUBCUTANEOUS

## 2020-12-29 MED ORDER — EPOETIN ALFA-EPBX 10000 UNIT/ML IJ SOLN
INTRAMUSCULAR | Status: AC
  Filled 2020-12-29: qty 1

## 2021-01-07 ENCOUNTER — Other Ambulatory Visit: Payer: Self-pay

## 2021-01-07 ENCOUNTER — Emergency Department (HOSPITAL_COMMUNITY)
Admission: EM | Admit: 2021-01-07 | Discharge: 2021-01-08 | Disposition: A | Discharge status: Home / Self Care | Payer: BC Managed Care – PPO | Attending: Emergency Medicine | Admitting: Emergency Medicine

## 2021-01-07 DIAGNOSIS — N186 End stage renal disease: Secondary | ICD-10-CM | POA: Insufficient documentation

## 2021-01-07 DIAGNOSIS — K625 Hemorrhage of anus and rectum: Secondary | ICD-10-CM

## 2021-01-07 DIAGNOSIS — Z940 Kidney transplant status: Secondary | ICD-10-CM | POA: Diagnosis not present

## 2021-01-07 DIAGNOSIS — Z79899 Other long term (current) drug therapy: Secondary | ICD-10-CM | POA: Insufficient documentation

## 2021-01-07 DIAGNOSIS — R7989 Other specified abnormal findings of blood chemistry: Secondary | ICD-10-CM | POA: Diagnosis not present

## 2021-01-07 DIAGNOSIS — I120 Hypertensive chronic kidney disease with stage 5 chronic kidney disease or end stage renal disease: Secondary | ICD-10-CM | POA: Insufficient documentation

## 2021-01-07 DIAGNOSIS — Z7982 Long term (current) use of aspirin: Secondary | ICD-10-CM | POA: Insufficient documentation

## 2021-01-07 DIAGNOSIS — Z87891 Personal history of nicotine dependence: Secondary | ICD-10-CM | POA: Insufficient documentation

## 2021-01-07 DIAGNOSIS — K644 Residual hemorrhoidal skin tags: Secondary | ICD-10-CM | POA: Insufficient documentation

## 2021-01-07 DIAGNOSIS — K645 Perianal venous thrombosis: Secondary | ICD-10-CM

## 2021-01-07 NOTE — ED Triage Notes (Signed)
Pt BIB [facility] from home. EMS reports pt c/o rectal cyst about 2 weeks ago and is now experiencing new onset of bright red rectal bleeding. Pt described it as "pouring blood." In route EMS did not notice any active bleeding.

## 2021-01-07 NOTE — ED Notes (Signed)
Called pt multiple times for triage with no answer

## 2021-01-08 ENCOUNTER — Emergency Department (HOSPITAL_COMMUNITY): Payer: BC Managed Care – PPO

## 2021-01-08 LAB — CBC WITH DIFFERENTIAL/PLATELET
Abs Immature Granulocytes: 0.03 10*3/uL (ref 0.00–0.07)
Basophils Absolute: 0 10*3/uL (ref 0.0–0.1)
Basophils Relative: 1 %
Eosinophils Absolute: 0 10*3/uL (ref 0.0–0.5)
Eosinophils Relative: 1 %
HCT: 25 % — ABNORMAL LOW (ref 36.0–46.0)
Hemoglobin: 8 g/dL — ABNORMAL LOW (ref 12.0–15.0)
Immature Granulocytes: 1 %
Lymphocytes Relative: 14 %
Lymphs Abs: 0.3 10*3/uL — ABNORMAL LOW (ref 0.7–4.0)
MCH: 27.8 pg (ref 26.0–34.0)
MCHC: 32 g/dL (ref 30.0–36.0)
MCV: 86.8 fL (ref 80.0–100.0)
Monocytes Absolute: 0.5 10*3/uL (ref 0.1–1.0)
Monocytes Relative: 22 %
Neutro Abs: 1.3 10*3/uL — ABNORMAL LOW (ref 1.7–7.7)
Neutrophils Relative %: 61 %
Platelets: 199 10*3/uL (ref 150–400)
RBC: 2.88 MIL/uL — ABNORMAL LOW (ref 3.87–5.11)
RDW: 16 % — ABNORMAL HIGH (ref 11.5–15.5)
WBC: 2.1 10*3/uL — ABNORMAL LOW (ref 4.0–10.5)
nRBC: 0 % (ref 0.0–0.2)

## 2021-01-08 LAB — BASIC METABOLIC PANEL
Anion gap: 9 (ref 5–15)
BUN: 63 mg/dL — ABNORMAL HIGH (ref 8–23)
CO2: 21 mmol/L — ABNORMAL LOW (ref 22–32)
Calcium: 8.4 mg/dL — ABNORMAL LOW (ref 8.9–10.3)
Chloride: 103 mmol/L (ref 98–111)
Creatinine, Ser: 3.89 mg/dL — ABNORMAL HIGH (ref 0.44–1.00)
GFR, Estimated: 12 mL/min — ABNORMAL LOW (ref >60)
Glucose, Bld: 87 mg/dL (ref 70–99)
Potassium: 4.3 mmol/L (ref 3.5–5.1)
Sodium: 133 mmol/L — ABNORMAL LOW (ref 135–145)

## 2021-01-08 LAB — PROTIME-INR
INR: 1.2 (ref 0.8–1.2)
Prothrombin Time: 15.1 seconds (ref 11.4–15.2)

## 2021-01-08 LAB — TYPE AND SCREEN
ABO/RH(D): B POS
Antibody Screen: NEGATIVE

## 2021-01-08 LAB — POC OCCULT BLOOD, ED: Fecal Occult Bld: POSITIVE — AB

## 2021-01-08 MED ORDER — LACTATED RINGERS IV BOLUS: 1000.0000 mL | Freq: Once | INTRAVENOUS | Status: DC

## 2021-01-08 MED ORDER — LIDOCAINE (ANORECTAL) 5 % EX GEL: 1.0000 | Freq: Every day | CUTANEOUS | 0 refills | Status: DC

## 2021-01-08 MED ORDER — HYDROCORTISONE (PERIANAL) 2.5 % EX CREA: 1.0000 "application " | TOPICAL_CREAM | Freq: Two times a day (BID) | CUTANEOUS | 0 refills | Status: DC

## 2021-01-08 NOTE — ED Provider Notes (Signed)
Emergency Medicine Provider Triage Evaluation Note  Karen [name] , a 65 y.o. female  was evaluated in triage.  Pt complains of rectal bleeding.  The patient reports that she felt a lump near her rectum for the last 2 weeks.  However, earlier tonight it "popped", and she immediately felt a rush of dark blood.  She noticed several clots.  She was unable to get the bleeding to stop so she called EMS.  EMS stated that they did not notice any active bleeding.  She denies shortness of breath, dizziness, lightheadedness, abdominal pain, vomiting, diarrhea, hematemesis, numbness, weakness, chest pain.  No family history of colorectal cancer.  She reports a remote history of a hemorrhoid that required surgery.  Review of Systems  Positive: Rectal bleeding Negative: Abdominal pain, nausea, vomiting, fever, chills, diarrhea, numbness, weakness, chest pain, dizziness, lightheadedness, syncope  Physical Exam  BP (!) 192/73 (BP Location: Left Arm)   Pulse 68   Temp 98.1 F (36.7 C) (Oral)   Resp 18   Ht 5\' 1"  (1.549 m)   Wt 68 kg   SpO2 95%   BMI 28.34 kg/m  Gen:   Awake, no distress   Resp:  Normal effort  MSK:   Moves extremities without difficulty  Other:  Chaperoned exam.  She has a firm lesion noted to the perianal region with purple discoloration.  No fluctuance.  There is minimal bright red blood oozing from the lesion.  Normal rectal tone.  Medical Decision Making  Medically screening exam initiated at 12:35 AM.  Appropriate orders placed.  Karen [name] was informed that the remainder of the evaluation will be completed by another provider, this initial triage assessment does not replace that evaluation, and the importance of remaining in the ED until their evaluation is complete.  65 year old female who presents the emergency department with rectal bleeding that began earlier tonight.  Patient was examined in triage and did have a very minimal amount of oozing noted from a lesion in  the perianal region.  Labs have been ordered.  She will require further work-up and evaluation in the emergency department.   Joline Maxcy A, PA-C 01/08/21 0035    Orpah Greek, MD 01/08/21 [phone]

## 2021-01-08 NOTE — ED Notes (Signed)
Pt provided discharge instructions and prescription information. Pt was given the opportunity to ask questions and questions were answered. Discharge signature not obtained in the setting of the COVID-19 pandemic in order to reduce high touch surfaces.  ° °

## 2021-01-08 NOTE — Discharge Instructions (Addendum)
Return to the emergency department in the event of uncontrollable bleeding, intolerable pain associated with your hemorrhoid.  In the meantime, manage your hemorrhoid conservatively with topical lidocaine, sitz bath's, stool softeners.  Follow-up outpatient for continued rechecks of your kidney function.

## 2021-01-08 NOTE — ED Provider Notes (Signed)
**Karen [name] De-Identified via Obfuscation** Karen Karen [name]   CSN: [id number] Arrival date & time: 01/07/21  2304     History Chief Complaint  Patient presents with   Rectal Bleeding    Karen Karen [name] is a 65 y.o. female.   Rectal Bleeding Associated symptoms: no abdominal pain, no fever and no vomiting     65 y.o. 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  Patient presents with sudden onset rectal bleeding.  She states that she has had a lump near her rectum for the past 2 weeks.  Yesterday evening, she felt it pop and felt an immediate rush of dark blood.  She passed several clots.  She came to the emergency department because she was unable to get the bleeding to stop.  She called EMS and EMS noted hemostasis.  She has not had recurrence of bleeding.  Per OSH documentation from transplant clinic, baseline serum Cr is 1.3-1.4. Most recent renal biopsy on 07/07/20 revealed no active evidence of transplant rejection.  Of Karen [name], over the past year, the patient's serum creatinine has gradually been rising.  She states that her last measurement outpatient was 3.6.   Past Medical History:  Diagnosis Date   Anemia associated with chronic renal failure    Arthritis    Convulsions/seizures ([place]) 01/28/2013   Dyslipidemia    GERD (gastroesophageal reflux disease)    Hyperlipidemia    Hypertension    Kidney transplanted    Seizure disorder [facility])     Patient Active Problem List   Diagnosis Date Noted   Renal transplant recipient 08/13/2015   Convulsions/seizures ([place]) 01/28/2013    Past Surgical History:  Procedure Laterality Date   CATHETER REMOVAL     COLONOSCOPY     GALLBLADDER SURGERY     KIDNEY TRANSPLANT     04/10/2009     OB History   No obstetric  history on file.     Family History  Problem Relation Age of Onset   Stroke Mother    Seizures Mother    Migraines Mother    Diabetes Brother    Colon cancer Neg Hx    Esophageal cancer Neg Hx    Stomach cancer Neg Hx    Rectal cancer Neg Hx     Social History   Tobacco Use   Smoking status: Former    Types: Cigarettes   Smokeless tobacco: Never   Tobacco comments:    quit smoking 25 yrs ago  Substance Use Topics   Alcohol use: No    Comment: quit 25 years ago   Drug use: No    Home Medications Prior to Admission medications   Medication Sig Start Date End Date Taking? Authorizing Provider  hydrocortisone (ANUSOL-HC) 2.5 % rectal cream Place 1 application rectally 2 (two) times daily. 01/08/21  Yes Regan Lemming, MD  Lidocaine, Anorectal, 5 % GEL Apply 1 each topically daily. 01/08/21  Yes Regan Lemming, MD  amLODipine (NORVASC) 10 MG tablet Take 10 mg by mouth daily.    [provider]  aspirin EC 81 MG tablet Take 81 mg by mouth daily.    [provider]  calcitRIOL (ROCALTROL) 0.25 MCG capsule Take 0.25 mcg by mouth daily. Mon,Wed Fri    [provider]  Epoetin Alfa-epbx (RETACRIT IJ) Inject as directed.    [provider]  hydrALAZINE (APRESOLINE) 25 MG tablet Take 25 mg by mouth 2 (two) times daily.    [provider]  labetalol (NORMODYNE) 100 MG tablet Take 100 mg by mouth 2 (two) times daily.    [provider]  losartan (COZAAR) 25 MG tablet Take 25 mg by mouth daily.    [provider]  magnesium oxide (MAG-OX) 400 MG tablet Take 400 mg by mouth 2 (two) times daily.     [provider]  mycophenolate (CELLCEPT) 250 MG capsule Take 500 mg by mouth 2 (two) times daily.     [provider]  omeprazole (PRILOSEC) 20 MG capsule Take 20 mg by mouth daily.    [provider]  ondansetron (ZOFRAN) 4 MG tablet Take 1 tablet (4 mg total) by mouth every 8 (eight) hours as needed for  nausea. 09/19/12   Marny Lowenstein, PA-C  oxyCODONE-acetaminophen (PERCOCET/ROXICET) 5-325 MG per tablet May take 1 or 2 tablets PO q 4-6 hours for severe pain - Do not take with Tylenol as this tablet already contains tylenol Patient not taking: Reported on 08/16/2020 09/19/12   Coralee North A, PA-C  predniSONE (DELTASONE) 5 MG tablet Take 5 mg by mouth daily.    [provider]  simvastatin (ZOCOR) 20 MG tablet Take 20 mg by mouth every evening.    [provider]  sulfamethoxazole-trimethoprim (BACTRIM,SEPTRA) 400-80 MG per tablet Take 1 tablet by mouth 3 (three) times a week. Takes on Monday, Wednesday and Friday.    [provider]  tacrolimus (PROGRAF) 1 MG capsule Take 4 mg by mouth 2 (two) times daily.    [provider]  VENTOLIN HFA 108 (90 Base) MCG/ACT inhaler Inhale 108 puffs into the lungs 3 times/day as needed-between meals & bedtime. 06/09/19   [provider]    Allergies    Patient has no known allergies.  Review of Systems   Review of Systems  Constitutional:  Negative for chills and fever.  HENT:  Negative for ear pain and sore throat.   Eyes:  Negative for pain and visual disturbance.  Respiratory:  Negative for cough and shortness of breath.   Cardiovascular:  Negative for chest pain and palpitations.  Gastrointestinal:  Positive for anal bleeding, hematochezia and rectal pain. Negative for abdominal pain and vomiting.  Genitourinary:  Negative for dysuria and hematuria.  Musculoskeletal:  Negative for arthralgias and back pain.  Skin:  Negative for color change and rash.  Neurological:  Negative for seizures and syncope.  All other systems reviewed and are negative.  Physical Exam Updated Vital Signs BP (!) 203/79 Comment: pt states she has not taken her B/P meds yet today.  Pulse 64   Temp 97.6 F (36.4 C) (Oral)   Resp 16   Ht 5\' 1"  (1.549 m)   Wt 68 kg   SpO2 100%   BMI 28.34 kg/m   Physical Exam Vitals and  nursing Karen [name] reviewed. Exam conducted with a chaperone present.  Constitutional:      General: She is not in acute distress.    Appearance: She is well-developed.  HENT:     Head: Normocephalic and atraumatic.  Eyes:     Conjunctiva/sclera: Conjunctivae normal.     Pupils: Pupils are equal, round, and reactive to light.  Cardiovascular:     Rate and Rhythm: Normal rate and regular rhythm.     Heart sounds: No  murmur heard. Pulmonary:     Effort: Pulmonary effort is normal. No respiratory distress.     Breath sounds: Normal breath sounds.  Abdominal:     General: There is no distension.     Palpations: Abdomen is soft.     Tenderness: There is no abdominal tenderness. There is no guarding.  Genitourinary:    Rectum: External hemorrhoid present.     Comments: Thrombosed external hemorrhoid present, hemostatic Musculoskeletal:        General: No deformity or signs of injury.     Cervical back: Normal range of motion and neck supple.  Skin:    General: Skin is warm and dry.     Findings: No lesion or rash.  Neurological:     General: No focal deficit present.     Mental Status: She is alert. Mental status is at baseline.    ED Results / Procedures / Treatments   Labs (all labs ordered are listed, but only abnormal results are displayed) Labs Reviewed  CBC WITH DIFFERENTIAL/PLATELET - Abnormal; Notable for the following components:      Result Value   WBC 2.1 (*)    RBC 2.88 (*)    Hemoglobin 8.0 (*)    HCT 25.0 (*)    RDW 16.0 (*)    Neutro Abs 1.3 (*)    Lymphs Abs 0.3 (*)    All other components within normal limits  BASIC METABOLIC PANEL - Abnormal; Notable for the following components:   Sodium 133 (*)    CO2 21 (*)    BUN 63 (*)    Creatinine, Ser 3.89 (*)    Calcium 8.4 (*)    GFR, Estimated 12 (*)    All other components within normal limits  POC OCCULT BLOOD, ED - Abnormal; Notable for the following components:   Fecal Occult Bld POSITIVE (*)    All other  components within normal limits  PROTIME-INR  TYPE AND SCREEN    EKG None  Radiology CT Abdomen Pelvis Wo Contrast  Result Date: 01/08/2021 CLINICAL DATA:  Suspected renal mass, buttock boil that patient popped. History of hypertension, end-stage renal disease post renal transplant, former smoker EXAM: CT ABDOMEN AND PELVIS WITHOUT CONTRAST TECHNIQUE: Multidetector CT imaging of the abdomen and pelvis was performed following the standard protocol without IV contrast. Sagittal and coronal MPR images reconstructed from axial data set. No oral contrast administered COMPARISON:  None FINDINGS: Lower chest: Trace LEFT pleural effusion and small RIGHT pleural effusion with associated basilar atelectasis RIGHT greater than LEFT Hepatobiliary: Upper normal hepatic attenuation. No focal hepatic mass or nodularity. Gallbladder surgically absent. Pancreas: Suboptimally visualized due to diffuse edema throughout abdomen and subcutaneous soft tissues. Spleen: Normal appearance Adrenals/Urinary Tract: Atrophic native kidneys. Transplant kidney RIGHT iliac fossa without mass identified by noncontrast CT. Bladder unremarkable. Calcified mass in LEFT iliac fossa consistent with failed transplant. Stomach/Bowel: Stool in rectum and throughout colon. Stomach decompressed. Bowel loops grossly unremarkable for suboptimal technique Vascular/Lymphatic: Extensive atherosclerotic calcifications aorta and iliac arteries without aneurysm. Abnormal soft tissue LEFT para-aortic question mildly enlarged lymph nodes, though this is suboptimally assessed due to lack of IV contrast, diffuse soft tissue edema and obscured tissue planes. Reproductive: Nonvisualization of uterus and ovaries Other: Diffuse soft tissue edema obscuring tissue planes, both in abdominal wall and intra-abdominal/intrapelvic. No free air. No hernia. Musculoskeletal: Unremarkable IMPRESSION: Atrophic native kidneys with transplant kidney RIGHT iliac fossa and old  calcified failed transplant kidney LEFT iliac fossa. Diffuse soft tissue edema  throughout abdomen and pelvis question mildly enlarged LEFT para-aortic lymph nodes, though this is suboptimally assessed due to lack of IV contrast, diffuse soft tissue edema and obscured tissue planes. Trace LEFT and small RIGHT pleural effusions with associated basilar atelectasis RIGHT greater than LEFT. No other definite intra-abdominal or intrapelvic abnormalities. Aortic Atherosclerosis (ICD10-I70.0). Electronically Signed   By: Lavonia Dana M.D.   On: 01/08/2021 08:58    Procedures Procedures   Medications Ordered in ED Medications - No data to display  ED Course  I have reviewed the triage vital signs and the nursing notes.  Pertinent labs & imaging results that were available during my care of the patient were reviewed by me and considered in my medical decision making (see chart for details).    MDM Rules/Calculators/A&P                           65 y.o. 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  Patient presents with sudden onset rectal bleeding.  She states that she has had a lump near her rectum for the past 2 weeks.  Yesterday evening, she felt it pop and felt an immediate rush of dark blood.  She passed several clots.  She came to the emergency department because she was unable to get the bleeding to stop.  She called EMS and EMS noted hemostasis.  She has not had recurrence of bleeding.  Per OSH documentation from transplant clinic, baseline serum Cr is 1.3-1.4. Most recent renal biopsy on 07/07/20 revealed no active evidence of transplant rejection.  Of Karen [name], over the past year, the patient's serum creatinine has gradually been rising.  She states that her last measurement outpatient was  3.6.  On physical exam, the patient was found to have a thrombosed external hemorrhoid.  Her hemorrhoid has been present for likely the past 2 weeks that she has been symptomatic for 2 weeks.  A likely ruptured last night resulting in her rectal bleeding.  She is currently hemostatic and has been for the past 14 hours.  The patient did not want excision in the emergency department.  Given the duration of symptoms, recommended conservative treatment with sitz bath's, Anusol cream, topical lidocaine.   Of Karen [name], the patient's serum creatinine today was 3.89 with an elevated BUN of 63, GFR of 12.  She states her last creatinine was checked outpatient to be 3.6.  I informed the patient that her renal function was worsening.  She states that she is well aware of this and gets this checked monthly.  She has close follow-up outpatient and her physicians have been monitoring her decline in transplant function.  She plans to follow-up outpatient for this.  I advised that she call her transplant clinic to discuss her labs.  Final Clinical Impression(s) / ED Diagnoses Final diagnoses:  Thrombosed external hemorrhoid  Rectal bleeding  Elevated serum creatinine    Rx / DC Orders ED Discharge Orders          Ordered    hydrocortisone (ANUSOL-HC) 2.5 % rectal cream  2 times daily        01/08/21 1249    Lidocaine, Anorectal, 5 % GEL  Daily        01/08/21  [place]             Regan Lemming, MD 01/08/21 1255

## 2021-01-12 ENCOUNTER — Other Ambulatory Visit: Payer: Self-pay

## 2021-01-12 ENCOUNTER — Encounter (HOSPITAL_COMMUNITY)
Admission: RE | Admit: 2021-01-12 | Discharge: 2021-01-12 | Disposition: A | Discharge status: Home / Self Care | Payer: BC Managed Care – PPO | Source: Ambulatory Visit | Attending: Nephrology | Admitting: Nephrology

## 2021-01-12 ENCOUNTER — Encounter (HOSPITAL_COMMUNITY): Payer: BC Managed Care – PPO

## 2021-01-12 ENCOUNTER — Other Ambulatory Visit (HOSPITAL_COMMUNITY): Payer: Self-pay | Admitting: *Deleted

## 2021-01-12 VITALS — BP 163/63 | HR 65 | Temp 97.6°F | Resp 18

## 2021-01-12 DIAGNOSIS — Z940 Kidney transplant status: Secondary | ICD-10-CM

## 2021-01-12 LAB — POCT HEMOGLOBIN-HEMACUE: Hemoglobin: 8 g/dL — ABNORMAL LOW (ref 12.0–15.0)

## 2021-01-12 MED ORDER — EPOETIN ALFA-EPBX 10000 UNIT/ML IJ SOLN: 20000.0000 [IU] | INTRAMUSCULAR | Status: DC

## 2021-01-12 MED ORDER — EPOETIN ALFA-EPBX 10000 UNIT/ML IJ SOLN
INTRAMUSCULAR | Status: AC
  Administered 2021-01-12: 20000 [IU] via SUBCUTANEOUS
  Filled 2021-01-12: qty 2

## 2021-01-25 ENCOUNTER — Other Ambulatory Visit: Payer: Self-pay

## 2021-01-25 ENCOUNTER — Ambulatory Visit
Admission: RE | Admit: 2021-01-25 | Discharge: 2021-01-25 | Disposition: A | Discharge status: Home / Self Care | Payer: BC Managed Care – PPO | Source: Ambulatory Visit | Attending: Internal Medicine | Admitting: Internal Medicine

## 2021-01-25 DIAGNOSIS — Z1231 Encounter for screening mammogram for malignant neoplasm of breast: Secondary | ICD-10-CM

## 2021-01-26 ENCOUNTER — Encounter (HOSPITAL_COMMUNITY): Payer: BC Managed Care – PPO

## 2021-01-26 ENCOUNTER — Encounter (HOSPITAL_COMMUNITY)
Admission: RE | Admit: 2021-01-26 | Discharge: 2021-01-26 | Disposition: A | Discharge status: Home / Self Care | Payer: BC Managed Care – PPO | Source: Ambulatory Visit | Attending: Nephrology | Admitting: Nephrology

## 2021-01-26 VITALS — BP 151/64 | HR 59 | Temp 97.9°F

## 2021-01-26 DIAGNOSIS — Z940 Kidney transplant status: Secondary | ICD-10-CM

## 2021-01-26 LAB — POCT HEMOGLOBIN-HEMACUE: Hemoglobin: 8.2 g/dL — ABNORMAL LOW (ref 12.0–15.0)

## 2021-01-26 MED ORDER — SODIUM CHLORIDE 0.9 % IV SOLN
510.0000 mg | Freq: Once | INTRAVENOUS | Status: AC
  Administered 2021-01-26: 510 mg via INTRAVENOUS
  Filled 2021-01-26: qty 17

## 2021-01-26 MED ORDER — EPOETIN ALFA-EPBX 10000 UNIT/ML IJ SOLN
20000.0000 [IU] | INTRAMUSCULAR | Status: DC
  Administered 2021-01-26: 20000 [IU] via SUBCUTANEOUS

## 2021-01-26 MED ORDER — EPOETIN ALFA-EPBX 10000 UNIT/ML IJ SOLN
INTRAMUSCULAR | Status: AC
  Filled 2021-01-26: qty 2

## 2021-02-09 ENCOUNTER — Encounter (HOSPITAL_COMMUNITY)
Admission: RE | Admit: 2021-02-09 | Discharge: 2021-02-09 | Disposition: A | Discharge status: Home / Self Care | Payer: BC Managed Care – PPO | Source: Ambulatory Visit | Attending: Nephrology | Admitting: Nephrology

## 2021-02-09 ENCOUNTER — Other Ambulatory Visit: Payer: Self-pay

## 2021-02-09 VITALS — BP 163/64 | HR 66 | Temp 97.2°F | Resp 18

## 2021-02-09 DIAGNOSIS — Z940 Kidney transplant status: Secondary | ICD-10-CM | POA: Diagnosis present

## 2021-02-09 LAB — IRON AND TIBC
Iron: 102 ug/dL (ref 28–170)
Saturation Ratios: 37 % — ABNORMAL HIGH (ref 10.4–31.8)
TIBC: 273 ug/dL (ref 250–450)
UIBC: 171 ug/dL

## 2021-02-09 LAB — FERRITIN: Ferritin: 618 ng/mL — ABNORMAL HIGH (ref 11–307)

## 2021-02-09 LAB — POCT HEMOGLOBIN-HEMACUE: Hemoglobin: 9 g/dL — ABNORMAL LOW (ref 12.0–15.0)

## 2021-02-09 MED ORDER — EPOETIN ALFA-EPBX 10000 UNIT/ML IJ SOLN
INTRAMUSCULAR | Status: AC
  Filled 2021-02-09: qty 2

## 2021-02-09 MED ORDER — EPOETIN ALFA-EPBX 10000 UNIT/ML IJ SOLN
20000.0000 [IU] | INTRAMUSCULAR | Status: DC
  Administered 2021-02-09: 20000 [IU] via SUBCUTANEOUS

## 2021-02-27 ENCOUNTER — Other Ambulatory Visit: Payer: Self-pay

## 2021-02-27 ENCOUNTER — Encounter (HOSPITAL_COMMUNITY)
Admission: RE | Admit: 2021-02-27 | Discharge: 2021-02-27 | Disposition: A | Discharge status: Home / Self Care | Payer: BC Managed Care – PPO | Source: Ambulatory Visit | Attending: Nephrology | Admitting: Nephrology

## 2021-02-27 DIAGNOSIS — Z940 Kidney transplant status: Secondary | ICD-10-CM

## 2021-02-27 LAB — POCT HEMOGLOBIN-HEMACUE: Hemoglobin: 9.4 g/dL — ABNORMAL LOW (ref 12.0–15.0)

## 2021-02-27 MED ORDER — EPOETIN ALFA-EPBX 10000 UNIT/ML IJ SOLN
INTRAMUSCULAR | Status: AC
  Filled 2021-02-27: qty 2

## 2021-02-27 MED ORDER — EPOETIN ALFA-EPBX 10000 UNIT/ML IJ SOLN
20000.0000 [IU] | INTRAMUSCULAR | Status: DC
  Administered 2021-02-27: 15:00:00 20000 [IU] via SUBCUTANEOUS

## 2021-03-13 ENCOUNTER — Encounter (HOSPITAL_COMMUNITY): Payer: BC Managed Care – PPO

## 2021-03-16 ENCOUNTER — Encounter (HOSPITAL_COMMUNITY): Payer: BC Managed Care – PPO

## 2021-03-23 ENCOUNTER — Ambulatory Visit (HOSPITAL_COMMUNITY)
Admission: RE | Admit: 2021-03-23 | Discharge: 2021-03-23 | Disposition: A | Discharge status: Home / Self Care | Payer: BC Managed Care – PPO | Source: Ambulatory Visit | Attending: Nephrology | Admitting: Nephrology

## 2021-03-23 ENCOUNTER — Other Ambulatory Visit: Payer: Self-pay

## 2021-03-23 VITALS — BP 152/55 | HR 71 | Temp 97.0°F | Resp 18

## 2021-03-23 DIAGNOSIS — Z940 Kidney transplant status: Secondary | ICD-10-CM | POA: Diagnosis present

## 2021-03-23 LAB — IRON AND TIBC
Iron: 95 ug/dL (ref 28–170)
Saturation Ratios: 36 % — ABNORMAL HIGH (ref 10.4–31.8)
TIBC: 262 ug/dL (ref 250–450)
UIBC: 167 ug/dL

## 2021-03-23 LAB — POCT HEMOGLOBIN-HEMACUE: Hemoglobin: 9.2 g/dL — ABNORMAL LOW (ref 12.0–15.0)

## 2021-03-23 LAB — FERRITIN: Ferritin: 598 ng/mL — ABNORMAL HIGH (ref 11–307)

## 2021-03-23 MED ORDER — EPOETIN ALFA-EPBX 10000 UNIT/ML IJ SOLN
20000.0000 [IU] | INTRAMUSCULAR | Status: DC
  Administered 2021-03-23: 15:00:00 20000 [IU] via SUBCUTANEOUS

## 2021-03-23 MED ORDER — EPOETIN ALFA-EPBX 10000 UNIT/ML IJ SOLN
INTRAMUSCULAR | Status: AC
  Filled 2021-03-23: qty 2

## 2021-03-30 ENCOUNTER — Encounter (HOSPITAL_COMMUNITY): Payer: BC Managed Care – PPO

## 2021-04-06 ENCOUNTER — Other Ambulatory Visit: Payer: Self-pay

## 2021-04-06 ENCOUNTER — Encounter (HOSPITAL_COMMUNITY)
Admission: RE | Admit: 2021-04-06 | Discharge: 2021-04-06 | Disposition: A | Discharge status: Home / Self Care | Payer: BC Managed Care – PPO | Source: Ambulatory Visit | Attending: Nephrology | Admitting: Nephrology

## 2021-04-06 VITALS — BP 162/59 | HR 77 | Temp 98.2°F | Resp 18

## 2021-04-06 DIAGNOSIS — Z940 Kidney transplant status: Secondary | ICD-10-CM | POA: Diagnosis present

## 2021-04-06 LAB — POCT HEMOGLOBIN-HEMACUE: Hemoglobin: 9 g/dL — ABNORMAL LOW (ref 12.0–15.0)

## 2021-04-06 MED ORDER — EPOETIN ALFA-EPBX 10000 UNIT/ML IJ SOLN
INTRAMUSCULAR | Status: AC
  Administered 2021-04-06: 20000 [IU] via SUBCUTANEOUS
  Filled 2021-04-06: qty 2

## 2021-04-06 MED ORDER — EPOETIN ALFA-EPBX 10000 UNIT/ML IJ SOLN: 20000.0000 [IU] | INTRAMUSCULAR | Status: DC

## 2021-04-20 ENCOUNTER — Encounter (HOSPITAL_COMMUNITY)
Admission: RE | Admit: 2021-04-20 | Discharge: 2021-04-20 | Disposition: A | Discharge status: Home / Self Care | Payer: BC Managed Care – PPO | Source: Ambulatory Visit | Attending: Nephrology | Admitting: Nephrology

## 2021-04-20 ENCOUNTER — Other Ambulatory Visit: Payer: Self-pay

## 2021-04-20 VITALS — BP 177/66 | HR 79 | Temp 97.4°F | Resp 18

## 2021-04-20 DIAGNOSIS — Z940 Kidney transplant status: Secondary | ICD-10-CM | POA: Diagnosis not present

## 2021-04-20 LAB — IRON AND TIBC
Iron: 70 ug/dL (ref 28–170)
Saturation Ratios: 27 % (ref 10.4–31.8)
TIBC: 263 ug/dL (ref 250–450)
UIBC: 193 ug/dL

## 2021-04-20 LAB — FERRITIN: Ferritin: 358 ng/mL — ABNORMAL HIGH (ref 11–307)

## 2021-04-20 MED ORDER — EPOETIN ALFA-EPBX 10000 UNIT/ML IJ SOLN
20000.0000 [IU] | INTRAMUSCULAR | Status: DC
  Administered 2021-04-20: 20000 [IU] via SUBCUTANEOUS

## 2021-04-20 MED ORDER — EPOETIN ALFA-EPBX 10000 UNIT/ML IJ SOLN
INTRAMUSCULAR | Status: AC
  Filled 2021-04-20: qty 2

## 2021-04-21 LAB — POCT HEMOGLOBIN-HEMACUE: Hemoglobin: 9.2 g/dL — ABNORMAL LOW (ref 12.0–15.0)

## 2021-04-27 ENCOUNTER — Encounter (HOSPITAL_COMMUNITY): Payer: BC Managed Care – PPO

## 2021-05-04 ENCOUNTER — Ambulatory Visit (HOSPITAL_COMMUNITY): Admission: RE | Admit: 2021-05-04 | Payer: BC Managed Care – PPO | Source: Ambulatory Visit

## 2021-05-11 ENCOUNTER — Other Ambulatory Visit: Payer: Self-pay

## 2021-05-11 ENCOUNTER — Encounter (HOSPITAL_COMMUNITY)
Admission: RE | Admit: 2021-05-11 | Discharge: 2021-05-11 | Disposition: A | Discharge status: Home / Self Care | Payer: BC Managed Care – PPO | Source: Ambulatory Visit | Attending: Nephrology | Admitting: Nephrology

## 2021-05-11 VITALS — BP 164/68 | HR 77 | Temp 98.0°F | Resp 18

## 2021-05-11 DIAGNOSIS — Z940 Kidney transplant status: Secondary | ICD-10-CM | POA: Insufficient documentation

## 2021-05-11 DIAGNOSIS — D631 Anemia in chronic kidney disease: Secondary | ICD-10-CM | POA: Insufficient documentation

## 2021-05-11 DIAGNOSIS — N185 Chronic kidney disease, stage 5: Secondary | ICD-10-CM | POA: Insufficient documentation

## 2021-05-11 LAB — POCT HEMOGLOBIN-HEMACUE: Hemoglobin: 9.5 g/dL — ABNORMAL LOW (ref 12.0–15.0)

## 2021-05-11 MED ORDER — EPOETIN ALFA-EPBX 10000 UNIT/ML IJ SOLN
INTRAMUSCULAR | Status: AC
  Filled 2021-05-11: qty 2

## 2021-05-11 MED ORDER — EPOETIN ALFA-EPBX 10000 UNIT/ML IJ SOLN
20000.0000 [IU] | INTRAMUSCULAR | Status: DC
  Administered 2021-05-11: 20000 [IU] via SUBCUTANEOUS

## 2021-05-18 ENCOUNTER — Encounter (HOSPITAL_COMMUNITY): Payer: BC Managed Care – PPO

## 2021-05-25 ENCOUNTER — Other Ambulatory Visit: Payer: Self-pay

## 2021-05-25 ENCOUNTER — Encounter (HOSPITAL_COMMUNITY)
Admission: RE | Admit: 2021-05-25 | Discharge: 2021-05-25 | Disposition: A | Discharge status: Home / Self Care | Payer: BC Managed Care – PPO | Source: Ambulatory Visit | Attending: Nephrology | Admitting: Nephrology

## 2021-05-25 VITALS — BP 161/64 | HR 73 | Temp 97.6°F | Resp 18

## 2021-05-25 DIAGNOSIS — Z940 Kidney transplant status: Secondary | ICD-10-CM

## 2021-05-25 LAB — IRON AND TIBC
Iron: 51 ug/dL (ref 28–170)
Saturation Ratios: 22 % (ref 10.4–31.8)
TIBC: 231 ug/dL — ABNORMAL LOW (ref 250–450)
UIBC: 180 ug/dL

## 2021-05-25 LAB — FERRITIN: Ferritin: 563 ng/mL — ABNORMAL HIGH (ref 11–307)

## 2021-05-25 LAB — POCT HEMOGLOBIN-HEMACUE: Hemoglobin: 9.2 g/dL — ABNORMAL LOW (ref 12.0–15.0)

## 2021-05-25 MED ORDER — EPOETIN ALFA-EPBX 10000 UNIT/ML IJ SOLN
INTRAMUSCULAR | Status: AC
  Filled 2021-05-25: qty 2

## 2021-05-25 MED ORDER — EPOETIN ALFA-EPBX 10000 UNIT/ML IJ SOLN
20000.0000 [IU] | INTRAMUSCULAR | Status: DC
  Administered 2021-05-25: 20000 [IU] via SUBCUTANEOUS

## 2021-06-08 ENCOUNTER — Encounter (HOSPITAL_COMMUNITY): Payer: BC Managed Care – PPO

## 2021-06-15 ENCOUNTER — Inpatient Hospital Stay (HOSPITAL_COMMUNITY)
Admission: RE | Admit: 2021-06-15 | Discharge: 2021-06-15 | Disposition: A | Discharge status: Home / Self Care | Payer: BC Managed Care – PPO | Source: Ambulatory Visit | Attending: Nephrology | Admitting: Nephrology

## 2021-06-15 ENCOUNTER — Inpatient Hospital Stay (HOSPITAL_COMMUNITY)
Admission: EM | Admit: 2021-06-15 | Discharge: 2021-06-21 | DRG: 640 | Disposition: A | Discharge status: Home / Self Care | Payer: BC Managed Care – PPO | Attending: Internal Medicine | Admitting: Internal Medicine

## 2021-06-15 ENCOUNTER — Emergency Department (HOSPITAL_COMMUNITY): Payer: BC Managed Care – PPO

## 2021-06-15 ENCOUNTER — Inpatient Hospital Stay (HOSPITAL_COMMUNITY): Payer: BC Managed Care – PPO

## 2021-06-15 ENCOUNTER — Other Ambulatory Visit: Payer: Self-pay

## 2021-06-15 ENCOUNTER — Encounter (HOSPITAL_COMMUNITY): Payer: Self-pay

## 2021-06-15 DIAGNOSIS — R569 Unspecified convulsions: Secondary | ICD-10-CM

## 2021-06-15 DIAGNOSIS — Z7982 Long term (current) use of aspirin: Secondary | ICD-10-CM | POA: Diagnosis not present

## 2021-06-15 DIAGNOSIS — G40909 Epilepsy, unspecified, not intractable, without status epilepticus: Secondary | ICD-10-CM | POA: Diagnosis present

## 2021-06-15 DIAGNOSIS — E8770 Fluid overload, unspecified: Secondary | ICD-10-CM | POA: Diagnosis not present

## 2021-06-15 DIAGNOSIS — D72819 Decreased white blood cell count, unspecified: Secondary | ICD-10-CM | POA: Diagnosis present

## 2021-06-15 DIAGNOSIS — M1990 Unspecified osteoarthritis, unspecified site: Secondary | ICD-10-CM | POA: Diagnosis present

## 2021-06-15 DIAGNOSIS — Z87898 Personal history of other specified conditions: Secondary | ICD-10-CM

## 2021-06-15 DIAGNOSIS — N186 End stage renal disease: Secondary | ICD-10-CM | POA: Diagnosis present

## 2021-06-15 DIAGNOSIS — J9601 Acute respiratory failure with hypoxia: Secondary | ICD-10-CM | POA: Diagnosis present

## 2021-06-15 DIAGNOSIS — E8729 Other acidosis: Secondary | ICD-10-CM

## 2021-06-15 DIAGNOSIS — Z20822 Contact with and (suspected) exposure to covid-19: Secondary | ICD-10-CM | POA: Diagnosis present

## 2021-06-15 DIAGNOSIS — K219 Gastro-esophageal reflux disease without esophagitis: Secondary | ICD-10-CM | POA: Diagnosis present

## 2021-06-15 DIAGNOSIS — N179 Acute kidney failure, unspecified: Secondary | ICD-10-CM

## 2021-06-15 DIAGNOSIS — D631 Anemia in chronic kidney disease: Secondary | ICD-10-CM | POA: Diagnosis present

## 2021-06-15 DIAGNOSIS — I3139 Other pericardial effusion (noninflammatory): Secondary | ICD-10-CM

## 2021-06-15 DIAGNOSIS — J810 Acute pulmonary edema: Secondary | ICD-10-CM

## 2021-06-15 DIAGNOSIS — I120 Hypertensive chronic kidney disease with stage 5 chronic kidney disease or end stage renal disease: Secondary | ICD-10-CM | POA: Diagnosis present

## 2021-06-15 DIAGNOSIS — I161 Hypertensive emergency: Secondary | ICD-10-CM | POA: Diagnosis present

## 2021-06-15 DIAGNOSIS — E785 Hyperlipidemia, unspecified: Secondary | ICD-10-CM | POA: Diagnosis present

## 2021-06-15 DIAGNOSIS — N2581 Secondary hyperparathyroidism of renal origin: Secondary | ICD-10-CM | POA: Diagnosis present

## 2021-06-15 DIAGNOSIS — N289 Disorder of kidney and ureter, unspecified: Secondary | ICD-10-CM | POA: Diagnosis not present

## 2021-06-15 DIAGNOSIS — I169 Hypertensive crisis, unspecified: Secondary | ICD-10-CM | POA: Diagnosis present

## 2021-06-15 DIAGNOSIS — R0609 Other forms of dyspnea: Secondary | ICD-10-CM | POA: Diagnosis not present

## 2021-06-15 DIAGNOSIS — Z7952 Long term (current) use of systemic steroids: Secondary | ICD-10-CM

## 2021-06-15 DIAGNOSIS — Z5320 Procedure and treatment not carried out because of patient's decision for unspecified reasons: Secondary | ICD-10-CM | POA: Diagnosis present

## 2021-06-15 DIAGNOSIS — R0602 Shortness of breath: Secondary | ICD-10-CM | POA: Diagnosis present

## 2021-06-15 DIAGNOSIS — I10 Essential (primary) hypertension: Secondary | ICD-10-CM | POA: Diagnosis present

## 2021-06-15 DIAGNOSIS — E875 Hyperkalemia: Secondary | ICD-10-CM

## 2021-06-15 DIAGNOSIS — J90 Pleural effusion, not elsewhere classified: Secondary | ICD-10-CM

## 2021-06-15 DIAGNOSIS — Z87891 Personal history of nicotine dependence: Secondary | ICD-10-CM | POA: Diagnosis not present

## 2021-06-15 DIAGNOSIS — N189 Chronic kidney disease, unspecified: Secondary | ICD-10-CM

## 2021-06-15 DIAGNOSIS — Z940 Kidney transplant status: Secondary | ICD-10-CM | POA: Diagnosis not present

## 2021-06-15 DIAGNOSIS — Z79899 Other long term (current) drug therapy: Secondary | ICD-10-CM

## 2021-06-15 DIAGNOSIS — D638 Anemia in other chronic diseases classified elsewhere: Secondary | ICD-10-CM

## 2021-06-15 LAB — I-STAT CHEM 8, ED
BUN: 99 mg/dL — ABNORMAL HIGH (ref 8–23)
Calcium, Ion: 0.95 mmol/L — ABNORMAL LOW (ref 1.15–1.40)
Chloride: 108 mmol/L (ref 98–111)
Creatinine, Ser: 5.7 mg/dL — ABNORMAL HIGH (ref 0.44–1.00)
Glucose, Bld: 128 mg/dL — ABNORMAL HIGH (ref 70–99)
HCT: 32 % — ABNORMAL LOW (ref 36.0–46.0)
Hemoglobin: 10.9 g/dL — ABNORMAL LOW (ref 12.0–15.0)
Potassium: 5.2 mmol/L — ABNORMAL HIGH (ref 3.5–5.1)
Sodium: 139 mmol/L (ref 135–145)
TCO2: 23 mmol/L (ref 22–32)

## 2021-06-15 LAB — CBC WITH DIFFERENTIAL/PLATELET
Abs Immature Granulocytes: 0.06 10*3/uL (ref 0.00–0.07)
Basophils Absolute: 0 10*3/uL (ref 0.0–0.1)
Basophils Relative: 1 %
Eosinophils Absolute: 0.1 10*3/uL (ref 0.0–0.5)
Eosinophils Relative: 4 %
HCT: 31.2 % — ABNORMAL LOW (ref 36.0–46.0)
Hemoglobin: 9.3 g/dL — ABNORMAL LOW (ref 12.0–15.0)
Immature Granulocytes: 2 %
Lymphocytes Relative: 30 %
Lymphs Abs: 1.1 10*3/uL (ref 0.7–4.0)
MCH: 27.4 pg (ref 26.0–34.0)
MCHC: 29.8 g/dL — ABNORMAL LOW (ref 30.0–36.0)
MCV: 91.8 fL (ref 80.0–100.0)
Monocytes Absolute: 0.6 10*3/uL (ref 0.1–1.0)
Monocytes Relative: 17 %
Neutro Abs: 1.7 10*3/uL (ref 1.7–7.7)
Neutrophils Relative %: 46 %
Platelets: 234 10*3/uL (ref 150–400)
RBC: 3.4 MIL/uL — ABNORMAL LOW (ref 3.87–5.11)
RDW: 16.4 % — ABNORMAL HIGH (ref 11.5–15.5)
WBC: 3.6 10*3/uL — ABNORMAL LOW (ref 4.0–10.5)
nRBC: 0 % (ref 0.0–0.2)

## 2021-06-15 LAB — RESP PANEL BY RT-PCR (FLU A&B, COVID) ARPGX2
Influenza A by PCR: NEGATIVE
Influenza B by PCR: NEGATIVE
SARS Coronavirus 2 by RT PCR: NEGATIVE

## 2021-06-15 LAB — COMPREHENSIVE METABOLIC PANEL
ALT: 7 U/L (ref 0–44)
AST: 19 U/L (ref 15–41)
Albumin: 3.2 g/dL — ABNORMAL LOW (ref 3.5–5.0)
Alkaline Phosphatase: 49 U/L (ref 38–126)
Anion gap: 13 (ref 5–15)
BUN: 92 mg/dL — ABNORMAL HIGH (ref 8–23)
CO2: 24 mmol/L (ref 22–32)
Calcium: 7.1 mg/dL — ABNORMAL LOW (ref 8.9–10.3)
Chloride: 104 mmol/L (ref 98–111)
Creatinine, Ser: 5.48 mg/dL — ABNORMAL HIGH (ref 0.44–1.00)
GFR, Estimated: 8 mL/min — ABNORMAL LOW (ref >60)
Glucose, Bld: 135 mg/dL — ABNORMAL HIGH (ref 70–99)
Potassium: 5.2 mmol/L — ABNORMAL HIGH (ref 3.5–5.1)
Sodium: 141 mmol/L (ref 135–145)
Total Bilirubin: 0.7 mg/dL (ref 0.3–1.2)
Total Protein: 6 g/dL — ABNORMAL LOW (ref 6.5–8.1)

## 2021-06-15 LAB — TROPONIN I (HIGH SENSITIVITY)
Troponin I (High Sensitivity): 23 ng/L — ABNORMAL HIGH (ref <18)
Troponin I (High Sensitivity): 27 ng/L — ABNORMAL HIGH (ref <18)

## 2021-06-15 LAB — I-STAT VENOUS BLOOD GAS, ED
Acid-base deficit: 5 mmol/L — ABNORMAL HIGH (ref 0.0–2.0)
Bicarbonate: 22.6 mmol/L (ref 20.0–28.0)
Calcium, Ion: 0.95 mmol/L — ABNORMAL LOW (ref 1.15–1.40)
HCT: 30 % — ABNORMAL LOW (ref 36.0–46.0)
Hemoglobin: 10.2 g/dL — ABNORMAL LOW (ref 12.0–15.0)
O2 Saturation: 94 %
Potassium: 5.3 mmol/L — ABNORMAL HIGH (ref 3.5–5.1)
Sodium: 140 mmol/L (ref 135–145)
TCO2: 24 mmol/L (ref 22–32)
pCO2, Ven: 52.3 mmHg (ref 44–60)
pH, Ven: 7.244 — ABNORMAL LOW (ref 7.25–7.43)
pO2, Ven: 83 mmHg — ABNORMAL HIGH (ref 32–45)

## 2021-06-15 LAB — HEPATITIS B SURFACE ANTIGEN: Hepatitis B Surface Ag: NONREACTIVE

## 2021-06-15 LAB — BRAIN NATRIURETIC PEPTIDE: B Natriuretic Peptide: 1860.9 pg/mL — ABNORMAL HIGH (ref 0.0–100.0)

## 2021-06-15 LAB — HEPATITIS B SURFACE ANTIBODY,QUALITATIVE: Hep B S Ab: NONREACTIVE

## 2021-06-15 LAB — HIV ANTIBODY (ROUTINE TESTING W REFLEX): HIV Screen 4th Generation wRfx: NONREACTIVE

## 2021-06-15 MED ORDER — HYDRALAZINE HCL 50 MG PO TABS
75.0000 mg | ORAL_TABLET | Freq: Two times a day (BID) | ORAL | Status: DC
  Administered 2021-06-15 – 2021-06-16 (×2): 75 mg via ORAL
  Filled 2021-06-15: qty 3
  Filled 2021-06-15: qty 1

## 2021-06-15 MED ORDER — ALBUTEROL SULFATE (2.5 MG/3ML) 0.083% IN NEBU
2.5000 mg | INHALATION_SOLUTION | RESPIRATORY_TRACT | Status: DC | PRN
  Administered 2021-06-15: 2.5 mg via RESPIRATORY_TRACT
  Filled 2021-06-15: qty 3

## 2021-06-15 MED ORDER — HYDRALAZINE HCL 20 MG/ML IJ SOLN
5.0000 mg | INTRAMUSCULAR | Status: DC | PRN
  Administered 2021-06-15 – 2021-06-19 (×9): 5 mg via INTRAVENOUS
  Filled 2021-06-15 (×12): qty 1

## 2021-06-15 MED ORDER — PREDNISONE 5 MG PO TABS
5.0000 mg | ORAL_TABLET | Freq: Every day | ORAL | Status: DC
  Administered 2021-06-15 – 2021-06-21 (×7): 5 mg via ORAL
  Filled 2021-06-15 (×8): qty 1

## 2021-06-15 MED ORDER — LIDOCAINE HCL (PF) 1 % IJ SOLN: 5.0000 mL | INTRAMUSCULAR | Status: DC | PRN

## 2021-06-15 MED ORDER — PANTOPRAZOLE SODIUM 40 MG PO TBEC
40.0000 mg | DELAYED_RELEASE_TABLET | Freq: Every day | ORAL | Status: DC
Start: 2021-06-15 — End: 2021-06-21
  Administered 2021-06-15 – 2021-06-21 (×7): 40 mg via ORAL
  Filled 2021-06-15 (×7): qty 1

## 2021-06-15 MED ORDER — LORAZEPAM 2 MG/ML IJ SOLN: 0.5000 mg | Freq: Once | INTRAMUSCULAR | Status: AC

## 2021-06-15 MED ORDER — ACETAMINOPHEN 325 MG PO TABS: 650.0000 mg | ORAL_TABLET | Freq: Four times a day (QID) | ORAL | Status: DC | PRN

## 2021-06-15 MED ORDER — ACETAMINOPHEN 650 MG RE SUPP: 650.0000 mg | Freq: Four times a day (QID) | RECTAL | Status: DC | PRN

## 2021-06-15 MED ORDER — ONDANSETRON HCL 4 MG PO TABS: 4.0000 mg | ORAL_TABLET | Freq: Four times a day (QID) | ORAL | Status: DC | PRN

## 2021-06-15 MED ORDER — SODIUM BICARBONATE 650 MG PO TABS
650.0000 mg | ORAL_TABLET | Freq: Two times a day (BID) | ORAL | Status: DC
  Administered 2021-06-15 – 2021-06-17 (×5): 650 mg via ORAL
  Filled 2021-06-15 (×5): qty 1

## 2021-06-15 MED ORDER — LORAZEPAM 2 MG/ML IJ SOLN
INTRAMUSCULAR | Status: AC
  Administered 2021-06-15: 0.5 mg
  Filled 2021-06-15: qty 1

## 2021-06-15 MED ORDER — CALCITRIOL 0.25 MCG PO CAPS
0.2500 ug | ORAL_CAPSULE | Freq: Every day | ORAL | Status: DC
  Administered 2021-06-15 – 2021-06-21 (×7): 0.25 ug via ORAL
  Filled 2021-06-15 (×7): qty 1

## 2021-06-15 MED ORDER — FUROSEMIDE 10 MG/ML IJ SOLN
80.0000 mg | Freq: Once | INTRAMUSCULAR | Status: AC
Start: 2021-06-15 — End: 2021-06-15
  Administered 2021-06-15: 80 mg via INTRAVENOUS
  Filled 2021-06-15: qty 8

## 2021-06-15 MED ORDER — FUROSEMIDE 10 MG/ML IJ SOLN
40.0000 mg | Freq: Once | INTRAMUSCULAR | Status: AC
  Administered 2021-06-15: 40 mg via INTRAVENOUS
  Filled 2021-06-15: qty 4

## 2021-06-15 MED ORDER — LABETALOL HCL 100 MG PO TABS
100.0000 mg | ORAL_TABLET | Freq: Two times a day (BID) | ORAL | Status: DC
  Administered 2021-06-15 – 2021-06-16 (×2): 100 mg via ORAL
  Filled 2021-06-15 (×2): qty 1

## 2021-06-15 MED ORDER — ALTEPLASE 2 MG IJ SOLR
2.0000 mg | Freq: Once | INTRAMUSCULAR | Status: DC | PRN
  Filled 2021-06-15: qty 2

## 2021-06-15 MED ORDER — MYCOPHENOLATE MOFETIL 250 MG PO CAPS
500.0000 mg | ORAL_CAPSULE | Freq: Two times a day (BID) | ORAL | Status: DC
  Administered 2021-06-15: 500 mg via ORAL
  Filled 2021-06-15 (×4): qty 2

## 2021-06-15 MED ORDER — HEPARIN SODIUM (PORCINE) 5000 UNIT/ML IJ SOLN
5000.0000 [IU] | Freq: Three times a day (TID) | INTRAMUSCULAR | Status: DC
  Administered 2021-06-15 – 2021-06-21 (×15): 5000 [IU] via SUBCUTANEOUS
  Filled 2021-06-15 (×16): qty 1

## 2021-06-15 MED ORDER — SODIUM CHLORIDE 0.9 % IV SOLN: 100.0000 mL | INTRAVENOUS | Status: DC | PRN

## 2021-06-15 MED ORDER — SULFAMETHOXAZOLE-TRIMETHOPRIM 400-80 MG PO TABS
1.0000 | ORAL_TABLET | ORAL | Status: DC
  Administered 2021-06-16 – 2021-06-19 (×2): 1 via ORAL
  Filled 2021-06-15 (×3): qty 1

## 2021-06-15 MED ORDER — MAGNESIUM OXIDE -MG SUPPLEMENT 400 (240 MG) MG PO TABS
400.0000 mg | ORAL_TABLET | Freq: Two times a day (BID) | ORAL | Status: DC
  Administered 2021-06-15 – 2021-06-21 (×9): 400 mg via ORAL
  Filled 2021-06-15 (×10): qty 1

## 2021-06-15 MED ORDER — TACROLIMUS 1 MG PO CAPS
4.0000 mg | ORAL_CAPSULE | Freq: Two times a day (BID) | ORAL | Status: DC
  Administered 2021-06-15 – 2021-06-17 (×4): 4 mg via ORAL
  Filled 2021-06-15 (×6): qty 4

## 2021-06-15 MED ORDER — HEPARIN SODIUM (PORCINE) 1000 UNIT/ML DIALYSIS
1000.0000 [IU] | INTRAMUSCULAR | Status: DC | PRN
  Filled 2021-06-15: qty 1

## 2021-06-15 MED ORDER — CAMPHOR-MENTHOL 0.5-0.5 % EX LOTN
1.0000 "application " | TOPICAL_LOTION | Freq: Three times a day (TID) | CUTANEOUS | Status: DC | PRN
  Filled 2021-06-15: qty 222

## 2021-06-15 MED ORDER — PENTAFLUOROPROP-TETRAFLUOROETH EX AERO
1.0000 "application " | INHALATION_SPRAY | CUTANEOUS | Status: DC | PRN
  Filled 2021-06-15: qty 116

## 2021-06-15 MED ORDER — NITROGLYCERIN IN D5W 200-5 MCG/ML-% IV SOLN
INTRAVENOUS | Status: AC
  Filled 2021-06-15: qty 250

## 2021-06-15 MED ORDER — ZOLPIDEM TARTRATE 5 MG PO TABS: 5.0000 mg | ORAL_TABLET | Freq: Every evening | ORAL | Status: DC | PRN

## 2021-06-15 MED ORDER — AMLODIPINE BESYLATE 10 MG PO TABS
10.0000 mg | ORAL_TABLET | Freq: Every day | ORAL | Status: DC
  Administered 2021-06-15 – 2021-06-21 (×7): 10 mg via ORAL
  Filled 2021-06-15 (×4): qty 1
  Filled 2021-06-15: qty 2
  Filled 2021-06-15 (×2): qty 1

## 2021-06-15 MED ORDER — CALCIUM CARBONATE ANTACID 500 MG PO CHEW
500.0000 mg | CHEWABLE_TABLET | Freq: Four times a day (QID) | ORAL | Status: DC | PRN
  Administered 2021-06-15: 500 mg via ORAL
  Filled 2021-06-15: qty 2.5

## 2021-06-15 MED ORDER — SIMVASTATIN 20 MG PO TABS
20.0000 mg | ORAL_TABLET | Freq: Every evening | ORAL | Status: DC
  Administered 2021-06-15 – 2021-06-20 (×5): 20 mg via ORAL
  Filled 2021-06-15 (×5): qty 1

## 2021-06-15 MED ORDER — ONDANSETRON HCL 4 MG/2ML IJ SOLN
4.0000 mg | Freq: Four times a day (QID) | INTRAMUSCULAR | Status: DC | PRN
  Administered 2021-06-15 – 2021-06-19 (×2): 4 mg via INTRAVENOUS
  Filled 2021-06-15 (×2): qty 2

## 2021-06-15 MED ORDER — ASPIRIN EC 81 MG PO TBEC
81.0000 mg | DELAYED_RELEASE_TABLET | Freq: Every day | ORAL | Status: DC
Start: 2021-06-16 — End: 2021-06-21
  Administered 2021-06-16 – 2021-06-21 (×6): 81 mg via ORAL
  Filled 2021-06-15 (×7): qty 1

## 2021-06-15 MED ORDER — DOCUSATE SODIUM 283 MG RE ENEM
1.0000 | ENEMA | RECTAL | Status: DC | PRN
  Filled 2021-06-15: qty 1

## 2021-06-15 MED ORDER — HYDROXYZINE HCL 25 MG PO TABS: 25.0000 mg | ORAL_TABLET | Freq: Three times a day (TID) | ORAL | Status: DC | PRN

## 2021-06-15 MED ORDER — CHLORHEXIDINE GLUCONATE CLOTH 2 % EX PADS
6.0000 | MEDICATED_PAD | Freq: Every day | CUTANEOUS | Status: DC
  Administered 2021-06-16 – 2021-06-21 (×6): 6 via TOPICAL

## 2021-06-15 MED ORDER — NEPRO/CARBSTEADY PO LIQD: 237.0000 mL | Freq: Three times a day (TID) | ORAL | Status: DC | PRN

## 2021-06-15 MED ORDER — SODIUM CHLORIDE 0.9% FLUSH
3.0000 mL | Freq: Two times a day (BID) | INTRAVENOUS | Status: DC
  Administered 2021-06-15 – 2021-06-21 (×11): 3 mL via INTRAVENOUS

## 2021-06-15 MED ORDER — SORBITOL 70 % SOLN: 30.0000 mL | Status: DC | PRN

## 2021-06-15 MED ORDER — LIDOCAINE-PRILOCAINE 2.5-2.5 % EX CREA
1.0000 "application " | TOPICAL_CREAM | CUTANEOUS | Status: DC | PRN
  Filled 2021-06-15: qty 5

## 2021-06-15 NOTE — Progress Notes (Signed)
Patient Bipap taken off and patient placed on 4L nasal cannula to administer medications- pills given one at a time. Patient requested a warm blanket. When RN left room, secretary stated daughter called and said patient was vomiting, prn IV zofran given. Will continue to monitor.  ?

## 2021-06-15 NOTE — ED Notes (Signed)
BIPAP was reapplied as soon as PO meds were done. ?

## 2021-06-15 NOTE — ED Notes (Signed)
500 mcg Bolus given by Alwyn Pea of [facility] via EDP verbal order. ?

## 2021-06-15 NOTE — Assessment & Plan Note (Addendum)
Hypertensive emergency.  ?Patient was placed on maximal dose of amlodipine and hydralazine, labetalol at 300 mg bid.  ?Plan to continue renal replacement therapy with ultrafiltration. ?Follow up blood pressure was outpatient, at the time of her discharge her blood pressure was 197/63 mmHg.  ?

## 2021-06-15 NOTE — Consult Note (Signed)
[facility]  ?INPATIENT CONSULTATION ? ?Reason for Consultation: CKD 5, volume overload ?Requesting Provider: Dr. Reather Converse ? ?HPI: Karen [name] is an 66 y.o. female with ESRD s/p renal transplant 2005, 2011 (secondary to HTN), HTN, GERD, anemia of CKD who is seen for evaluation and management of CKD and volume overload.  ? ?She presented to [facility] ED today for dyspnea - says has been slowly progressive off and on for months.  +orthopnea and PND, worsening swelling recently.  Afebrile.  HTN 220 initially, started NTG gtt and Bipap.  CXR with pleural effusions.  Was given lasix 40 IV in ED, we added 80IV right after.  She feels better on bipap.  ? ?05/30/21 annual transplant visit [facility] - 2022 had renal biopsy for progressive decline and it showed chronic changes.  At that visit Cr noted to be 5.77.  Appt with primary nephrologist Dr. Justin Mend today. ?Already on ESA for anemia from CKD - f/b hematology with h/o neutropenia.   ? ?Brother at bedside in ED. ? ?PMH: ?Past Medical History:  ?Diagnosis Date  ? Anemia associated with chronic renal failure   ? Arthritis   ? Convulsions/seizures ([place]) 01/28/2013  ? Dyslipidemia   ? GERD (gastroesophageal reflux disease)   ? Hyperlipidemia   ? Hypertension   ? Kidney transplanted   ? Seizure disorder ([place])   ? ?PSH: ?Past Surgical History:  ?Procedure Laterality Date  ? CATHETER REMOVAL    ? COLONOSCOPY    ? GALLBLADDER SURGERY    ? KIDNEY TRANSPLANT    ? 04/10/2009  ? ? ?Past Medical History:  ?Diagnosis Date  ? Anemia associated with chronic renal failure   ? Arthritis   ? Convulsions/seizures ([place]) 01/28/2013  ? Dyslipidemia   ? GERD (gastroesophageal reflux disease)   ? Hyperlipidemia   ? Hypertension   ? Kidney transplanted   ? Seizure disorder ([place])   ? ? ?Medications: ? I have reviewed the patient's current medications. ? ?(Not in a hospital admission) ? ? ?ALLERGIES: ? No Known Allergies ? ?FAM HX: ?Family History  ?Problem Relation Age of Onset  ? Stroke Mother    ? Seizures Mother   ? Migraines Mother   ? Diabetes Brother   ? Colon cancer Neg Hx   ? Esophageal cancer Neg Hx   ? Stomach cancer Neg Hx   ? Rectal cancer Neg Hx   ? ? ?Social History:  ? reports that she has quit smoking. Her smoking use included cigarettes. She has never used smokeless tobacco. She reports that she does not drink alcohol and does not use drugs. ? ?ROS: 12 system ROS negative except per HPI ? ?Blood pressure (!) 145/74, pulse 75, temperature 97.8 ?F (36.6 ?C), temperature source Temporal, resp. rate (!) 21, SpO2 100 %. ?PHYSICAL EXAM: ?Gen: lying on stretcher with bipap on  ?Eyes: anicteric, periorbital edema ?ENT: dry MM with bipap ?Neck: supple, JVD to jaw ?CV:  RRR, no rub ?Abd: soft, mildly distended ?Lungs: dec in bases, rales in mid fields laterally ?GU: purewick - no urine in can ?Extr: 2+ generalized pitting edema to thighs, RUE AVF +aneurysms, +t/b ?Neuro: awake and alert ?  ?Results for orders placed or performed during the hospital encounter of 06/15/21 (from the past 48 hour(s))  ?Resp Panel by RT-PCR (Flu A&B, Covid) Nasopharyngeal Swab     Status: None  ? Collection Time: 06/15/21  8:00 AM  ? Specimen: Nasopharyngeal Swab; Nasopharyngeal(NP) swabs in vial transport medium  ?Result Value Ref Range  ?  SARS Coronavirus 2 by RT PCR NEGATIVE NEGATIVE  ?  Comment: (NOTE) ?SARS-CoV-2 target nucleic acids are NOT DETECTED. ? ?The SARS-CoV-2 RNA is generally detectable in upper respiratory ?specimens during the acute phase of infection. The lowest ?concentration of SARS-CoV-2 viral copies this assay can detect is ?138 copies/mL. A negative result does not preclude SARS-Cov-2 ?infection and should not be used as the sole basis for treatment or ?other patient management decisions. A negative result may occur with  ?improper specimen collection/handling, submission of specimen other ?than nasopharyngeal swab, presence of viral mutation(s) within the ?areas targeted by this assay, and  inadequate number of viral ?copies(<138 copies/mL). A negative result must be combined with ?clinical observations, patient history, and epidemiological ?information. The expected result is Negative. ? ?Fact Sheet for Patients:  ?EntrepreneurPulse.com.au ? ?Fact Sheet for Healthcare Providers:  ?IncredibleEmployment.be ? ?This test is no t yet approved or cleared by the [place] FDA and  ?has been authorized for detection and/or diagnosis of SARS-CoV-2 by ?FDA under an Emergency Use Authorization (EUA). This EUA will remain  ?in effect (meaning this test can be used) for the duration of the ?COVID-19 declaration under Section 564(b)(1) of the Act, 21 ?U.S.C.section 360bbb-3(b)(1), unless the authorization is terminated  ?or revoked sooner.  ? ? ?  ? Influenza A by PCR NEGATIVE NEGATIVE  ? Influenza B by PCR NEGATIVE NEGATIVE  ?  Comment: (NOTE) ?The Xpert Xpress SARS-CoV-2/FLU/RSV plus assay is intended as an aid ?in the diagnosis of influenza from Nasopharyngeal swab specimens and ?should not be used as a sole basis for treatment. Nasal washings and ?aspirates are unacceptable for Xpert Xpress SARS-CoV-2/FLU/RSV ?testing. ? ?Fact Sheet for Patients: ?EntrepreneurPulse.com.au ? ?Fact Sheet for Healthcare Providers: ?IncredibleEmployment.be ? ?This test is not yet approved or cleared by the [place] FDA and ?has been authorized for detection and/or diagnosis of SARS-CoV-2 by ?FDA under an Emergency Use Authorization (EUA). This EUA will remain ?in effect (meaning this test can be used) for the duration of the ?COVID-19 declaration under Section 564(b)(1) of the Act, 21 U.S.C. ?section 360bbb-3(b)(1), unless the authorization is terminated or ?revoked. ? ?Performed at [facility], [place] [address], [place], [place] ?[address] ?  ?I-stat chem 8, ED     Status: Abnormal  ? Collection Time: 06/15/21  8:16 AM  ?Result Value Ref Range  ?  Sodium 139 135 - 145 mmol/L  ? Potassium 5.2 (H) 3.5 - 5.1 mmol/L  ? Chloride 108 98 - 111 mmol/L  ? BUN 99 (H) 8 - 23 mg/dL  ? Creatinine, Ser 5.70 (H) 0.44 - 1.00 mg/dL  ? Glucose, Bld 128 (H) 70 - 99 mg/dL  ?  Comment: Glucose reference range applies only to samples taken after fasting for at least 8 hours.  ? Calcium, Ion 0.95 (L) 1.15 - 1.40 mmol/L  ? TCO2 23 22 - 32 mmol/L  ? Hemoglobin 10.9 (L) 12.0 - 15.0 g/dL  ? HCT 32.0 (L) 36.0 - 46.0 %  ?I-Stat venous blood gas, ED     Status: Abnormal  ? Collection Time: 06/15/21  8:17 AM  ?Result Value Ref Range  ? pH, Ven 7.244 (L) 7.25 - 7.43  ? pCO2, Ven 52.3 44 - 60 mmHg  ? pO2, Ven 83 (H) 32 - 45 mmHg  ? Bicarbonate 22.6 20.0 - 28.0 mmol/L  ? TCO2 24 22 - 32 mmol/L  ? O2 Saturation 94 %  ? Acid-base deficit 5.0 (H) 0.0 - 2.0 mmol/L  ?  Sodium 140 135 - 145 mmol/L  ? Potassium 5.3 (H) 3.5 - 5.1 mmol/L  ? Calcium, Ion 0.95 (L) 1.15 - 1.40 mmol/L  ? HCT 30.0 (L) 36.0 - 46.0 %  ? Hemoglobin 10.2 (L) 12.0 - 15.0 g/dL  ? Sample type VENOUS   ?Comprehensive metabolic panel     Status: Abnormal  ? Collection Time: 06/15/21  8:23 AM  ?Result Value Ref Range  ? Sodium 141 135 - 145 mmol/L  ? Potassium 5.2 (H) 3.5 - 5.1 mmol/L  ? Chloride 104 98 - 111 mmol/L  ? CO2 24 22 - 32 mmol/L  ? Glucose, Bld 135 (H) 70 - 99 mg/dL  ?  Comment: Glucose reference range applies only to samples taken after fasting for at least 8 hours.  ? BUN 92 (H) 8 - 23 mg/dL  ? Creatinine, Ser 5.48 (H) 0.44 - 1.00 mg/dL  ? Calcium 7.1 (L) 8.9 - 10.3 mg/dL  ? Total Protein 6.0 (L) 6.5 - 8.1 g/dL  ? Albumin 3.2 (L) 3.5 - 5.0 g/dL  ? AST 19 15 - 41 U/L  ? ALT 7 0 - 44 U/L  ? Alkaline Phosphatase 49 38 - 126 U/L  ? Total Bilirubin 0.7 0.3 - 1.2 mg/dL  ? GFR, Estimated 8 (L) >60 mL/min  ?  Comment: (NOTE) ?Calculated using the CKD-EPI Creatinine Equation (2021) ?  ? Anion gap 13 5 - 15  ?  Comment: Performed at [facility], [place] [address], [place], [place] [address]  ?CBC with Differential     Status:  Abnormal  ? Collection Time: 06/15/21  8:23 AM  ?Result Value Ref Range  ? WBC 3.6 (L) 4.0 - 10.5 K/uL  ? RBC 3.40 (L) 3.87 - 5.11 MIL/uL  ? Hemoglobin 9.3 (L) 12.0 - 15.0 g/dL  ? HCT 31.2 (L) 36.0 - 46.0 %  ?

## 2021-06-15 NOTE — TOC Progression Note (Signed)
Transition of Care (TOC) - Progression Note  ? ? ?Patient Details  ?Name: Kalayna Noy ?MRN: [id number] ?Date of Birth: Jan 19, 1956 ? ?Transition of Care (TOC) CM/SW Contact  ?Gittel Mccamish Renold Don, LCSWA ?Phone Number: ?06/15/2021, 4:47 PM ? ?Clinical Narrative:    ? ?Transition of Care (TOC) Screening Note ? ? ?Patient Details  ?Name: Kaytlin Burklow ?Date of Birth: 1955/11/09 ? ? ?Transition of Care (TOC) CM/SW Contact:    ?Reece Agar, LCSWA ?Phone Number: ?06/15/2021, 4:47 PM ? ? ? ?Transition of Care Department [facility]) has reviewed patient and no TOC needs have been identified at this time. We will continue to monitor patient advancement through interdisciplinary progression rounds. If new patient transition needs arise, please place a TOC consult. ?  ? ? ?  ?  ? ?Expected Discharge Plan and Services ?  ?  ?  ?  ?  ?                ?  ?  ?  ?  ?  ?  ?  ?  ?  ?  ? ? ?Social Determinants of Health (SDOH) Interventions ?  ? ?Readmission Risk Interventions ?No flowsheet data found. ? ?

## 2021-06-15 NOTE — Assessment & Plan Note (Addendum)
Positive leukopenia, with wbc down to 1,7 ?Decision was made to discontinue mycophenolate and reduced dose of tacrolimus.  ?Continue with prednisone and close follow up as outpatient.  ?

## 2021-06-15 NOTE — Assessment & Plan Note (Addendum)
History of seizures associated with PRES and then had a prolonged seizure-free period off medications ?Plan to continue blood pressure control and follow up as outpatient.  ?

## 2021-06-15 NOTE — Progress Notes (Signed)
RT tried patient off bipap, pt had increased WOB off bipap on a 4L [place]. RT placed patient back on bipap at this time. ?

## 2021-06-15 NOTE — Assessment & Plan Note (Addendum)
Continue with statin therapy.  ?

## 2021-06-15 NOTE — ED Triage Notes (Signed)
Pt BIB [facility] from home d/t c/o SOB for the past 3 weeks. EMS reports pt spO2 86% on RA & 95% on NRB with Nebs going. En route to ED pt refused c-pap & Bi-pap. Did receive 15 Albuterol, 1 Atrovent, 125 Solu-Medrol, & 2g Mag. BP was 220/110, 86 bpm, 20g PIV in Lt AC. A/Ox4.  ?

## 2021-06-15 NOTE — ED Notes (Signed)
EDP using [place] at bedside & states mild pericardial effusion seen. ?

## 2021-06-15 NOTE — Assessment & Plan Note (Addendum)
Patient has admitted to the progressive care unit, she underwent renal replacement therapy with ultrafiltration with improvement of her symptoms.  ? 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  ?Her acute hypoxemic respiratory failure due to volume overload, acute pulmonary edema resolved with ultrafiltration.  ? ?At the time of her discharge her oxygenation was 96% on room air.  ? ?For anemia of chronic renal disease will continue with EPO and iron supplementation.  ?Her discharge hgb is 7.8 and hct 25.8  ?For metabolic bone disease, continue with calcitriol.  ? ?Right upper extremity AV fistula in place for HD access.  ?

## 2021-06-15 NOTE — ED Notes (Signed)
RT at bedside & removed BIPAP, pt swallowed po BP meds so Nitro can be titrated down. Provider aware. ?

## 2021-06-15 NOTE — H&P (Signed)
?History and Physical  ? ? ?PatientElveta [name] HOZ:[id number] DOB: November 22, 1955 ?DOA: 06/15/2021 ?DOS: the patient was seen and examined on 06/15/2021 ?PCP: Karen Oh, MD  ?Patient coming from: Home; NOK: Daughter, Karen [name],, [phone] ? ? ?Chief Complaint: SOB ? ?HPI: Karen [name] is a 66 y.o. female with medical history significant of renal transplant not yet back on HD; seizure d/o; HTN; and HLD presenting with SOB.  She reports that her renal function has been worsening and she has been SOB with edema.  She awoke this AM with acutely worsened SOB.  She has a fistula in her R arm and thinks it is functional. ? ? ? ?ER Course:  Acute pulmonary edema.  BP 220.  Refused BIPAP but gave Ativan and she agreed.  Improved now.  Given NTG bolus, titrating to keep BP >120.  Large pleural effusions, small pericardial effusion, decent EF.  Nephrology will consult, recommended 120 mg Lasix.  She still makes urine. ? ? ? ? ?Review of Systems: As mentioned in the history of present illness. All other systems reviewed and are negative.  Limited by BIPAP ?Past Medical History:  ?Diagnosis Date  ? Anemia associated with chronic renal failure   ? Arthritis   ? Convulsions/seizures ([place]) 01/28/2013  ? Dyslipidemia   ? GERD (gastroesophageal reflux disease)   ? Hyperlipidemia   ? Hypertension   ? Kidney transplanted   ? Seizure disorder ([place])   ? ?Past Surgical History:  ?Procedure Laterality Date  ? CATHETER REMOVAL    ? COLONOSCOPY    ? GALLBLADDER SURGERY    ? KIDNEY TRANSPLANT    ? 04/10/2009  ? ?Social History:  reports that she has quit smoking. Her smoking use included cigarettes. She has never used smokeless tobacco. She reports that she does not drink alcohol and does not use drugs. ? ?No Known Allergies ? ?Family History  ?Problem Relation Age of Onset  ? Stroke Mother   ? Seizures Mother   ? Migraines Mother   ? Diabetes Brother   ? Colon cancer Neg Hx   ? Esophageal cancer Neg Hx   ? Stomach cancer Neg Hx    ? Rectal cancer Neg Hx   ? ? ?Prior to Admission medications   ?Medication Sig Start Date End Date Taking? Authorizing Provider  ?amLODipine (NORVASC) 10 MG tablet Take 10 mg by mouth daily.    [provider]  ?aspirin EC 81 MG tablet Take 81 mg by mouth daily.    [provider]  ?calcitRIOL (ROCALTROL) 0.25 MCG capsule Take 0.25 mcg by mouth daily. Mon,Wed Fri    [provider]  ?Epoetin Alfa-epbx (RETACRIT IJ) Inject as directed.    [provider]  ?hydrALAZINE (APRESOLINE) 25 MG tablet Take 25 mg by mouth 2 (two) times daily.    [provider]  ?hydrocortisone (ANUSOL-HC) 2.5 % rectal cream Place 1 application rectally 2 (two) times daily. 01/08/21   Regan Lemming, MD  ?labetalol (NORMODYNE) 100 MG tablet Take 100 mg by mouth 2 (two) times daily.    [provider]  ?Lidocaine, Anorectal, 5 % GEL Apply 1 each topically daily. 01/08/21   Regan Lemming, MD  ?losartan (COZAAR) 25 MG tablet Take 25 mg by mouth daily.    [provider]  ?magnesium oxide (MAG-OX) 400 MG tablet Take 400 mg by mouth 2 (two) times daily.     [provider]  ?mycophenolate (CELLCEPT) 250 MG capsule Take 500 mg by mouth 2 (two)  times daily.     [provider]  ?omeprazole (PRILOSEC) 20 MG capsule Take 20 mg by mouth daily.    [provider]  ?ondansetron (ZOFRAN) 4 MG tablet Take 1 tablet (4 mg total) by mouth every 8 (eight) hours as needed for nausea. 09/19/12   Marny Lowenstein, PA-C  ?oxyCODONE-acetaminophen (PERCOCET/ROXICET) 5-325 MG per tablet May take 1 or 2 tablets PO q 4-6 hours for severe pain - Do not take with Tylenol as this tablet already contains tylenol ?Patient not taking: Reported on 08/16/2020 09/19/12   Coralee North A, PA-C  ?predniSONE (DELTASONE) 5 MG tablet Take 5 mg by mouth daily.    [provider]  ?simvastatin (ZOCOR) 20 MG tablet Take 20 mg by mouth every evening.    [provider]   ?sulfamethoxazole-trimethoprim (BACTRIM,SEPTRA) 400-80 MG per tablet Take 1 tablet by mouth 3 (three) times a week. Takes on Monday, Wednesday and Friday.    [provider]  ?tacrolimus (PROGRAF) 1 MG capsule Take 4 mg by mouth 2 (two) times daily.    [provider]  ?VENTOLIN HFA 108 (90 Base) MCG/ACT inhaler Inhale 108 puffs into the lungs 3 times/day as needed-between meals & bedtime. 06/09/19   [provider]  ? ? ?Physical Exam: ?Vitals:  ? 06/15/21 1546 06/15/21 1604 06/15/21 1636 06/15/21 1836  ?BP: (!) 167/67 (!) 168/67  (!) 183/64  ?Pulse:   75   ?Resp: '18 16 18   '$ ?Temp:   97.8 ?F (36.6 ?C)   ?TempSrc:   Oral   ?SpO2: 98% 99% 100%   ?Weight:      ?Height:      ? ?General:  Audible wheezing despite BIPAP ?Eyes:  EOMI, normal lids, iris ?ENT:  grossly normal hearing, lips; BIPAP in place ?Neck:  no LAD, masses or thyromegaly ?Cardiovascular:  RRR, no m/r/g. 3+ LE edema.  ?Respiratory:   Diffuse wheezing.  Increased respiratory effort. ?Abdomen:  soft, NT, ND ?Skin:  no rash or induration seen on limited exam ?Musculoskeletal:  grossly normal tone BUE/BLE, good ROM, no bony abnormality ?Psychiatric:  blunted mood and affect, speech fluent and appropriate, AOx3 ?Neurologic:  unremarkable but limited by clinical scenario ? ? ?Radiological Exams on Admission: ?Independently reviewed - see discussion in A/P where applicable ? ?US Renal Transplant w/Doppler ? ?Result Date: 06/15/2021 ?CLINICAL DATA:  Acute kidney injury EXAM: ULTRASOUND OF RENAL TRANSPLANT WITH RENAL DOPPLER ULTRASOUND TECHNIQUE: Ultrasound examination of the renal transplant was performed with gray-scale, color and duplex doppler evaluation. COMPARISON:  None. FINDINGS: Transplant kidney location: RLQ Transplant Kidney: Renal measurements: 10.9 x 6.3 x 5.1 cm = volume: 121m. Normal in size. Diffusely echogenic renal parenchyma. Mild hydronephrosis. Color flow in the main renal artery:  Yes Color flow in the main renal  vein:  Yes Duplex Doppler Evaluation: Main Renal Artery Velocity: 217 cm/second cm/sec Main Renal Artery Resistive Index: 0.91 Venous waveform in main renal vein:  Present Intrarenal resistive index in upper pole:  0.92 (normal 0.6-0.8; equivocal 0.8-0.9; abnormal >= 0.9) Intrarenal resistive index in lower pole: 0.87 (normal 0.6-0.8; equivocal 0.8-0.9; abnormal >= 0.9) Bladder: Not visualized Other findings:  Small volume ascites. IMPRESSION: 1. Abnormal exam. 2. Elevated resistive indices greater than 0.9. Differential considerations include ATN, transplant rejection and drug toxicity. 3. Very mild hydronephrosis. 4. Diffusely echogenic renal parenchyma. Electronically Signed   By: HJacqulynn CadetM.D.   On: 06/15/2021 14:35  ? ?DG Chest Portable 1 View ? ?Result Date: 06/15/2021 ?CLINICAL  DATA:  Shortness of breath EXAM: PORTABLE CHEST 1 VIEW COMPARISON:  11/29/2006 FINDINGS: Gross cardiomegaly. Layering bilateral pleural effusions and associated atelectasis or consolidation. Osseous structures are unremarkable. IMPRESSION: Gross cardiomegaly. Layering bilateral pleural effusions and associated atelectasis or consolidation. Electronically Signed   By: Delanna Ahmadi M.D.   On: 06/15/2021 08:32   ? ?EKG: Independently reviewed.  NSR with rate 92; nonspecific ST changes with no evidence of acute ischemia ? ? ?Labs on Admission: I have personally reviewed the available labs and imaging studies at the time of the admission. ? ?Pertinent labs:   ? ?VBG: 7.244/52.3/22.6 ?K+ 5.2 ?Glucose 135 ?BUN 92/Creatinine 5.48/GFR 8 ?Albumin 3.2 ?BNP 1860.9 ?HS troponin 23, 27 ?WBC 3.6 ?Hgb 9.3 ?COVID/flu negative ? ? ? ?Assessment and Plan: ?* Hypervolemia associated with renal insufficiency ?-Patient with prior h/o renal transplant, has not been back on HD but has had progressive CKD ?-Now with frank pulmonary edema and volume overload, in need of acute HD ?-This may be precipitated by HTN Crisis, by ESRD, or both ?-Regardless,  she needs diuresis (given Lasix 120 mg by nephrology without marked improvement), BP control (placed on NTG drip initially but then given home meds with attempt to wean for HD), and HD at least transiently ?-She is l

## 2021-06-15 NOTE — Progress Notes (Signed)
Pt transported to 8N27 with no complications noted. Report called to [facility] RRT. ?

## 2021-06-15 NOTE — Progress Notes (Signed)
?   06/15/21 1415  ?Assess: MEWS Score  ?Temp 97.6 ?F (36.4 ?C)  ?BP (!) 222/79  ?Pulse Rate 82  ?Resp (!) 25  ?SpO2 98 %  ?O2 Device Bi-PAP  ?Assess: MEWS Score  ?MEWS Temp 0  ?MEWS Systolic 2  ?MEWS Pulse 0  ?MEWS RR 1  ?MEWS LOC 0  ?MEWS Score 3  ?MEWS Score Color Yellow  ?Assess: if the MEWS score is Yellow or Red  ?Were vital signs taken at a resting state? Yes  ?Focused Assessment  ?(First time assessing patient)  ?Early Detection of Sepsis Score *See Row Information* Low  ?MEWS guidelines implemented *See Row Information* Yes  ?Treat  ?MEWS Interventions Administered scheduled meds/treatments  ?Pain Scale 0-10  ?Pain Score 0  ?Take Vital Signs  ?Increase Vital Sign Frequency  Yellow: Q 2hr X 2 then Q 4hr X 2, if remains yellow, continue Q 4hrs  ?Escalate  ?MEWS: Escalate Yellow: discuss with charge nurse/RN and consider discussing with provider and RRT  ?Notify: Charge Nurse/RN  ?Name of Charge Nurse/RN Notified Ignacia Marvel, RN  ?Date Charge Nurse/RN Notified 06/15/21  ?Time Charge Nurse/RN Notified 1415  ?Notify: Provider  ?Provider Name/Title Dr. Lorin Mercy  ?Date Provider Notified 06/15/21  ?Time Provider Notified 1415  ?Notification Type Page ?(secure chat)  ?Provider response Other (Comment) ?(per MD, ok to give hydralazine 5 mg)  ?Document  ?Progress note created (see row info) Yes  ? ? ?

## 2021-06-15 NOTE — Progress Notes (Addendum)
BP 222/79 Respirations 30. MD paged. chart review, last dose was given at 1245 in ER. Per MD, ok to give hyrdralazine '5mg'$  IV now. Respiratory at bedside to give breathing treatment.  ?

## 2021-06-15 NOTE — ED Provider Notes (Signed)
?[place] ?Provider Note ? ? ?CSN: [id number] ?Arrival date & time: 06/15/21  3532 ? ?  ? ?History ? ?Chief Complaint  ?Patient presents with  ? Shortness of Breath  ? ? ?Karen [name] is a 66 y.o. female. ? ?Patient presents in respiratory distress after gradually worsening shortness of breath and wheezing for almost 3 weeks.  Patient denies fevers however has had worsening cough.  Difficulty obtaining details due to BiPAP and respiratory difficulty.  Patient denies vomiting, has had minimal diarrhea.  Patient still produces urine but less amount recently.  Patient's had weight gain and worsening leg edema.  Patient's been compliant with medications. ? ? ?  ? ?Home Medications ?Prior to Admission medications   ?Medication Sig Start Date End Date Taking? Authorizing Provider  ?amLODipine (NORVASC) 10 MG tablet Take 10 mg by mouth daily.   Yes [provider]  ?aspirin EC 81 MG tablet Take 81 mg by mouth daily.   Yes [provider]  ?azithromycin (ZITHROMAX) 250 MG tablet Take 250 mg by mouth as directed. Started on 06-13-21 x 5 days 06/13/21  Yes [provider]  ?calcitRIOL (ROCALTROL) 0.25 MCG capsule Take 0.25 mcg by mouth daily. Mon,Wed Fri   Yes [provider]  ?furosemide (LASIX) 40 MG tablet Take 80 mg by mouth daily. 06/08/21  Yes [provider]  ?hydrALAZINE (APRESOLINE) 25 MG tablet Take 75 mg by mouth 2 (two) times daily.   Yes [provider]  ?labetalol (NORMODYNE) 100 MG tablet Take 100 mg by mouth 2 (two) times daily.   Yes [provider]  ?magnesium oxide (MAG-OX) 400 MG tablet Take 400 mg by mouth 2 (two) times daily.    Yes [provider]  ?mycophenolate (CELLCEPT) 250 MG capsule Take 500 mg by mouth 2 (two) times daily.    Yes [provider]  ?omeprazole (PRILOSEC) 20 MG capsule Take 20 mg by mouth daily.   Yes [provider]  ?predniSONE (DELTASONE) 5 MG tablet  Take 5 mg by mouth daily.   Yes [provider]  ?simvastatin (ZOCOR) 20 MG tablet Take 20 mg by mouth every evening.   Yes [provider]  ?sodium bicarbonate 650 MG tablet Take 650 mg by mouth 2 (two) times daily. 06/06/21  Yes [provider]  ?sulfamethoxazole-trimethoprim (BACTRIM,SEPTRA) 400-80 MG per tablet Take 1 tablet by mouth 3 (three) times a week. Takes on Monday, Wednesday and Friday.   Yes [provider]  ?tacrolimus (PROGRAF) 1 MG capsule Take 4 mg by mouth 2 (two) times daily.   Yes [provider]  ?VENTOLIN HFA 108 (90 Base) MCG/ACT inhaler Inhale 108 puffs into the lungs 3 times/day as needed-between meals & bedtime. 06/09/19  Yes [provider]  ?Lidocaine, Anorectal, 5 % GEL Apply 1 each topically daily. ?Patient not taking: Reported on 06/15/2021 01/08/21   Regan Lemming, MD  ?   ? ?Allergies    ?Patient has no known allergies.   ? ?Review of Systems   ?Review of Systems  ?Unable to perform ROS: Acuity of condition  ? ?Physical Exam ?Updated Vital Signs ?BP (!) 157/75   Pulse 74   Temp 97.8 ?F (36.6 ?C) (Temporal)   Resp (!) 21   SpO2 100%  ?Physical Exam ?Vitals and nursing note reviewed.  ?Constitutional:   ?   General: She is not in acute distress. ?   Appearance: She is well-developed.  ?HENT:  ?   Head:  Normocephalic and atraumatic.  ?   Comments: Dry mm ?Eyes:  ?   General:     ?   Right eye: No discharge.     ?   Left eye: No discharge.  ?   Conjunctiva/sclera: Conjunctivae normal.  ?Neck:  ?   Trachea: No tracheal deviation.  ?Cardiovascular:  ?   Rate and Rhythm: Normal rate and regular rhythm.  ?Pulmonary:  ?   Effort: Tachypnea and respiratory distress present.  ?   Breath sounds: Examination of the right-upper field reveals wheezing. Examination of the left-upper field reveals wheezing. Examination of the right-middle field reveals wheezing. Examination of the left-middle field reveals wheezing. Wheezing present.  ?Abdominal:   ?   General: There is no distension.  ?   Palpations: Abdomen is soft.  ?   Tenderness: There is no abdominal tenderness. There is no guarding.  ?Musculoskeletal:  ?   Cervical back: Normal range of motion and neck supple. No rigidity.  ?   Right lower leg: Edema present.  ?   Left lower leg: Edema present.  ?Skin: ?   General: Skin is warm.  ?   Capillary Refill: Capillary refill takes less than 2 seconds.  ?   Findings: No rash.  ?Neurological:  ?   General: No focal deficit present.  ?   Mental Status: She is alert.  ?   Cranial Nerves: No cranial nerve deficit.  ?Psychiatric:     ?   Mood and Affect: Mood is anxious.  ? ? ?ED Results / Procedures / Treatments   ?Labs ?(all labs ordered are listed, but only abnormal results are displayed) ?Labs Reviewed  ?COMPREHENSIVE METABOLIC PANEL - Abnormal; Notable for the following components:  ?    Result Value  ? Potassium 5.2 (*)   ? Glucose, Bld 135 (*)   ? BUN 92 (*)   ? Creatinine, Ser 5.48 (*)   ? Calcium 7.1 (*)   ? Total Protein 6.0 (*)   ? Albumin 3.2 (*)   ? GFR, Estimated 8 (*)   ? All other components within normal limits  ?CBC WITH DIFFERENTIAL/PLATELET - Abnormal; Notable for the following components:  ? WBC 3.6 (*)   ? RBC 3.40 (*)   ? Hemoglobin 9.3 (*)   ? HCT 31.2 (*)   ? MCHC 29.8 (*)   ? RDW 16.4 (*)   ? All other components within normal limits  ?BRAIN NATRIURETIC PEPTIDE - Abnormal; Notable for the following components:  ? B Natriuretic Peptide 1,860.9 (*)   ? All other components within normal limits  ?I-STAT VENOUS BLOOD GAS, ED - Abnormal; Notable for the following components:  ? pH, Ven 7.244 (*)   ? pO2, Ven 83 (*)   ? Acid-base deficit 5.0 (*)   ? Potassium 5.3 (*)   ? Calcium, Ion 0.95 (*)   ? HCT 30.0 (*)   ? Hemoglobin 10.2 (*)   ? All other components within normal limits  ?I-STAT CHEM 8, ED - Abnormal; Notable for the following components:  ? Potassium 5.2 (*)   ? BUN 99 (*)   ? Creatinine, Ser 5.70 (*)   ? Glucose, Bld 128 (*)   ?  Calcium, Ion 0.95 (*)   ? Hemoglobin 10.9 (*)   ? HCT 32.0 (*)   ? All other components within normal limits  ?TROPONIN I (HIGH SENSITIVITY) - Abnormal; Notable for the following components:  ? Troponin I (High Sensitivity) 23 (*)   ?  All other components within normal limits  ?RESP PANEL BY RT-PCR (FLU A&B, COVID) ARPGX2  ?TACROLIMUS LEVEL  ?URINALYSIS, ROUTINE W REFLEX MICROSCOPIC  ?TROPONIN I (HIGH SENSITIVITY)  ? ? ?EKG ?EKG Interpretation ? ?Date/Time:  Thursday June 15 2021 07:53:10 EDT ?Ventricular Rate:  92 ?PR Interval:  155 ?QRS Duration: 94 ?QT Interval:  380 ?QTC Calculation: 471 ?R Axis:   74 ?Text Interpretation: Sinus rhythm Probable left atrial enlargement Consider anterior infarct Baseline wander in lead(s) V5 Confirmed by Elnora Morrison [phone]) on 06/15/2021 8:01:48 AM ? ?Radiology ?DG Chest Portable 1 View ? ?Result Date: 06/15/2021 ?CLINICAL DATA:  Shortness of breath EXAM: PORTABLE CHEST 1 VIEW COMPARISON:  11/29/2006 FINDINGS: Gross cardiomegaly. Layering bilateral pleural effusions and associated atelectasis or consolidation. Osseous structures are unremarkable. IMPRESSION: Gross cardiomegaly. Layering bilateral pleural effusions and associated atelectasis or consolidation. Electronically Signed   By: Delanna Ahmadi M.D.   On: 06/15/2021 08:32   ? ?Procedures ?Ultrasound ED Echo ? ?Date/Time: 06/15/2021 9:25 AM ?Performed by: Elnora Morrison, MD ?Authorized by: Elnora Morrison, MD  ? ?Procedure details:  ?  Indications: dyspnea   ?  Views: subxiphoid, parasternal long axis view, parasternal short axis view, apical 4 chamber view and IVC view   ?  Images: archived   ?  Limitations:  Body habitus ?Findings:  ?  Pericardium: small pericardial effusion   ?  LV Function: depressed (30 - 50%)   ?  RV Diameter: normal   ?Impression:  ?  Impression: pericardial effusion present, probable elevated CVP and decreased contractility   ?Ultrasound ED Thoracic ? ?Date/Time: 06/15/2021 9:27 AM ?Performed by:  Elnora Morrison, MD ?Authorized by: Elnora Morrison, MD  ? ?Procedure details:  ?  Indications: dyspnea   ?  Assessment for:  Pleural effusion ?  Left lung pleural:  Visualized ?  Right lung pleural:  Visualized ?  Imag

## 2021-06-15 NOTE — ED Notes (Signed)
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 ?

## 2021-06-16 ENCOUNTER — Inpatient Hospital Stay (HOSPITAL_COMMUNITY): Payer: BC Managed Care – PPO

## 2021-06-16 DIAGNOSIS — R0609 Other forms of dyspnea: Secondary | ICD-10-CM

## 2021-06-16 DIAGNOSIS — N289 Disorder of kidney and ureter, unspecified: Secondary | ICD-10-CM | POA: Diagnosis not present

## 2021-06-16 DIAGNOSIS — E8770 Fluid overload, unspecified: Secondary | ICD-10-CM | POA: Diagnosis not present

## 2021-06-16 LAB — BASIC METABOLIC PANEL
Anion gap: 11 (ref 5–15)
BUN: 56 mg/dL — ABNORMAL HIGH (ref 8–23)
CO2: 25 mmol/L (ref 22–32)
Calcium: 7.9 mg/dL — ABNORMAL LOW (ref 8.9–10.3)
Chloride: 102 mmol/L (ref 98–111)
Creatinine, Ser: 3.95 mg/dL — ABNORMAL HIGH (ref 0.44–1.00)
GFR, Estimated: 12 mL/min — ABNORMAL LOW (ref >60)
Glucose, Bld: 98 mg/dL (ref 70–99)
Potassium: 4.9 mmol/L (ref 3.5–5.1)
Sodium: 138 mmol/L (ref 135–145)

## 2021-06-16 LAB — ECHOCARDIOGRAM COMPLETE
AR max vel: 2.82 cm2
AV Peak grad: 17.4 mmHg
Ao pk vel: 2.09 m/s
Area-P 1/2: 4.57 cm2
Height: 61 in
MV VTI: 1.79 cm2
S' Lateral: 2.5 cm
Weight: 2610.25 oz

## 2021-06-16 LAB — CBC
HCT: 29.9 % — ABNORMAL LOW (ref 36.0–46.0)
Hemoglobin: 9.4 g/dL — ABNORMAL LOW (ref 12.0–15.0)
MCH: 27.6 pg (ref 26.0–34.0)
MCHC: 31.4 g/dL (ref 30.0–36.0)
MCV: 87.7 fL (ref 80.0–100.0)
Platelets: 212 10*3/uL (ref 150–400)
RBC: 3.41 MIL/uL — ABNORMAL LOW (ref 3.87–5.11)
RDW: 16.3 % — ABNORMAL HIGH (ref 11.5–15.5)
WBC: 1.9 10*3/uL — ABNORMAL LOW (ref 4.0–10.5)
nRBC: 0 % (ref 0.0–0.2)

## 2021-06-16 LAB — FERRITIN: Ferritin: 456 ng/mL — ABNORMAL HIGH (ref 11–307)

## 2021-06-16 LAB — HEPATITIS B SURFACE ANTIBODY, QUANTITATIVE: Hep B S AB Quant (Post): <3.1 m[IU]/mL — ABNORMAL LOW (ref >9.9)

## 2021-06-16 LAB — IRON AND TIBC
Iron: 55 ug/dL (ref 28–170)
Saturation Ratios: 21 % (ref 10.4–31.8)
TIBC: 259 ug/dL (ref 250–450)
UIBC: 204 ug/dL

## 2021-06-16 MED ORDER — MYCOPHENOLATE MOFETIL 250 MG PO CAPS
250.0000 mg | ORAL_CAPSULE | Freq: Two times a day (BID) | ORAL | Status: DC
  Administered 2021-06-16 – 2021-06-20 (×9): 250 mg via ORAL
  Filled 2021-06-16 (×10): qty 1

## 2021-06-16 MED ORDER — FUROSEMIDE 10 MG/ML IJ SOLN
160.0000 mg | Freq: Four times a day (QID) | INTRAVENOUS | Status: AC
  Administered 2021-06-16: 160 mg via INTRAVENOUS
  Filled 2021-06-16 (×3): qty 16

## 2021-06-16 MED ORDER — HYDRALAZINE HCL 25 MG PO TABS
75.0000 mg | ORAL_TABLET | Freq: Three times a day (TID) | ORAL | Status: DC
  Administered 2021-06-16 – 2021-06-18 (×7): 75 mg via ORAL
  Filled 2021-06-16 (×7): qty 1

## 2021-06-16 MED ORDER — LABETALOL HCL 200 MG PO TABS
200.0000 mg | ORAL_TABLET | Freq: Two times a day (BID) | ORAL | Status: DC
Start: 2021-06-16 — End: 2021-06-19
  Administered 2021-06-16 – 2021-06-18 (×6): 200 mg via ORAL
  Filled 2021-06-16 (×6): qty 1

## 2021-06-16 NOTE — Progress Notes (Signed)
?   06/16/21 0723  ?Assess: MEWS Score  ?Temp 98.2 ?F (36.8 ?C)  ?BP (!) 214/75  ?Pulse Rate 81  ?Resp 18  ?SpO2 95 %  ?O2 Device Nasal Cannula  ?Assess: MEWS Score  ?MEWS Temp 0  ?MEWS Systolic 2  ?MEWS Pulse 0  ?MEWS RR 0  ?MEWS LOC 0  ?MEWS Score 2  ?MEWS Score Color Yellow  ?Assess: if the MEWS score is Yellow or Red  ?Were vital signs taken at a resting state? Yes  ?Focused Assessment No change from prior assessment  ?Early Detection of Sepsis Score *See Row Information* Low  ?MEWS guidelines implemented *See Row Information* Yes  ?Treat  ?MEWS Interventions Administered scheduled meds/treatments  ?Pain Scale 0-10  ?Pain Score 0  ?Take Vital Signs  ?Increase Vital Sign Frequency  Yellow: Q 2hr X 2 then Q 4hr X 2, if remains yellow, continue Q 4hrs  ?Escalate  ?MEWS: Escalate Yellow: discuss with charge nurse/RN and consider discussing with provider and RRT  ?Notify: Charge Nurse/RN  ?Name of Charge Nurse/RN Notified Ignacia Marvel, RN  ?Date Charge Nurse/RN Notified 06/16/21  ?Time Charge Nurse/RN Notified [phone]  ?Document  ?Progress note created (see row info) Yes  ? ? ?

## 2021-06-16 NOTE — Progress Notes (Signed)
Echocardiogram ?2D Echocardiogram has been performed. ? ?Karen [name] ?06/16/2021, 10:16 AM ?

## 2021-06-16 NOTE — Progress Notes (Signed)
?PROGRESS NOTE ? ? ? ?Karen [name]  FBP:[id number] DOB: Apr 20, 1955 DOA: 06/15/2021 ?PCP: Edrick Oh, MD  ?Narrative; 65/F with history of ESRD, status post renal transplant in 2005 in 2011, hypertension, progressive CKD4, followed by transplant team at [facility] and Dr. Justin Mend at [facility] was admitted with acute hypoxic respiratory failure secondary to pulmonary edema, hypertensive emergency, creatinine of 5.7, she had a AV fistula from before. ?-Nephrology consulted, started HD 3/16 ? ? ?Subjective: Feels better, breathing is improving, came off BiPAP last night ? ?Assessment and Plan: ? ?Pulmonary edema ?Acute hypoxic respiratory failure ?Progressive CKD 5 ?-History of renal transplant x2, has a functioning AV fistula ?-Required BiPAP on admission, now off, ?-Considered ESRD, started dialysis last night ?-Appreciate nephrology input ?-Avoid hypotension ?-Remains on high-dose diuretics as well, urine output is poor ? ?Hypertensive crisis ?-Improving, continue amlodipine, increase labetalol and hydralazine dose ? ?Renal transplant recipient ?-She remains on prednisone, tacrolimus, and mycophenolate  ? ?Anemia of chronic disease ?-Iron stores okay, continue EPO with HD ? ?Convulsions/seizures (Why) ?-Remote h/o seizures associated with PRES and then had a prolonged seizure-free period off medications ?-Recurrent ? Seizure a year ago with negative EEG ?-She is not currently taking medications for this issue ? ?Dyslipidemia ?-Continue Zocor ? ?DVT prophylaxis: Heparin subcutaneous ?Code Status: Full code ?Family Communication: Discussed with patient in detail, no family at bedside ?Disposition Plan: Home likely 4 to 5 days ? ?Consultants:  ?Nephrology ? ?Procedures:  ? ?Antimicrobials:  ? ? ?Objective: ?Vitals:  ? 06/16/21 0846 06/16/21 1045 06/16/21 1046 06/16/21 1135  ?BP: (!) 150/80 (!) 158/56  (!) 170/60  ?Pulse:   80 78  ?Resp:    20  ?Temp:    98.8 ?F (37.1 ?C)  ?TempSrc:    Oral  ?SpO2:    95%   ?Weight:      ?Height:      ? ? ?Intake/Output Summary (Last 24 hours) at 06/16/2021 1139 ?Last data filed at 06/16/2021 8242 ?Gross per 24 hour  ?Intake 340 ml  ?Output 4012 ml  ?Net -3672 ml  ? ?Filed Weights  ? 06/15/21 2222 06/16/21 0222 06/16/21 0340  ?Weight: 80.1 kg 75 kg 74 kg  ? ? ?Examination: ? ?General exam: Pleasant obese female sitting up in bed, AAOx3, no distress ?HEENT: Positive JVD ?CVS: S1-S2, regular rhythm ?Lungs: Decreased breath sounds the bases, fine basilar rales ?Abdomen: Soft, nontender, bowel sounds present, lateral abdominal wall edema ?Extremities: 2+ edema ?Skin: No rashes ?Psychiatry: Judgement and insight appear normal. Mood & affect appropriate.  ? ? ? ?Data Reviewed:  ? ?CBC: ?Recent Labs  ?Lab 06/15/21 ?0816 06/15/21 ?3536 06/15/21 ?1443 06/16/21 ?1540  ?WBC  --   --  3.6* 1.9*  ?NEUTROABS  --   --  1.7  --   ?HGB 10.9* 10.2* 9.3* 9.4*  ?HCT 32.0* 30.0* 31.2* 29.9*  ?MCV  --   --  91.8 87.7  ?PLT  --   --  234 212  ? ?Basic Metabolic Panel: ?Recent Labs  ?Lab 06/15/21 ?0816 06/15/21 ?0867 06/15/21 ?6195 06/16/21 ?0932  ?NA 139 140 141 138  ?K 5.2* 5.3* 5.2* 4.9  ?CL 108  --  104 102  ?CO2  --   --  24 25  ?GLUCOSE 128*  --  135* 98  ?BUN 99*  --  92* 56*  ?CREATININE 5.70*  --  5.48* 3.95*  ?CALCIUM  --   --  7.1* 7.9*  ? ?GFR: ?Estimated Creatinine Clearance: 13.2 mL/min (A) (by  C-G formula based on SCr of 3.95 mg/dL (H)). ?Liver Function Tests: ?Recent Labs  ?Lab 06/15/21 ?0823  ?AST 19  ?ALT 7  ?ALKPHOS 49  ?BILITOT 0.7  ?PROT 6.0*  ?ALBUMIN 3.2*  ? ?No results for input(s): LIPASE, AMYLASE in the last 168 hours. ?No results for input(s): AMMONIA in the last 168 hours. ?Coagulation Profile: ?No results for input(s): INR, PROTIME in the last 168 hours. ?Cardiac Enzymes: ?No results for input(s): CKTOTAL, CKMB, CKMBINDEX, TROPONINI in the last 168 hours. ?BNP (last 3 results) ?No results for input(s): PROBNP in the last 8760 hours. ?HbA1C: ?No results for input(s): HGBA1C in the  last 72 hours. ?CBG: ?No results for input(s): GLUCAP in the last 168 hours. ?Lipid Profile: ?No results for input(s): CHOL, HDL, LDLCALC, TRIG, CHOLHDL, LDLDIRECT in the last 72 hours. ?Thyroid Function Tests: ?No results for input(s): TSH, T4TOTAL, FREET4, T3FREE, THYROIDAB in the last 72 hours. ?Anemia Panel: ?No results for input(s): VITAMINB12, FOLATE, FERRITIN, TIBC, IRON, RETICCTPCT in the last 72 hours. ?Urine analysis: ?   ?Component Value Date/Time  ? [place] YELLOW 09/18/2012 2057  ? APPEARANCEUR CLEAR 09/18/2012 2057  ? LABSPEC 1.021 09/18/2012 2057  ? PHURINE 6.0 09/18/2012 2057  ? GLUCOSEU NEGATIVE 09/18/2012 2057  ? [place] NEGATIVE 09/18/2012 2057  ? [place] NEGATIVE 09/18/2012 2057  ? [place] NEGATIVE 09/18/2012 2057  ? PROTEINUR >300 (A) 09/18/2012 2057  ? UROBILINOGEN 0.2 09/18/2012 2057  ? NITRITE NEGATIVE 09/18/2012 2057  ? LEUKOCYTESUR NEGATIVE 09/18/2012 2057  ? ?Sepsis Labs: ?'@LABRCNTIP'$ (procalcitonin:4,lacticidven:4) ? ?) ?Recent Results (from the past 240 hour(s))  ?Resp Panel by RT-PCR (Flu A&B, Covid) Nasopharyngeal Swab     Status: None  ? Collection Time: 06/15/21  8:00 AM  ? Specimen: Nasopharyngeal Swab; Nasopharyngeal(NP) swabs in vial transport medium  ?Result Value Ref Range Status  ? SARS Coronavirus 2 by RT PCR NEGATIVE NEGATIVE Final  ?  Comment: (NOTE) ?SARS-CoV-2 target nucleic acids are NOT DETECTED. ? ?The SARS-CoV-2 RNA is generally detectable in upper respiratory ?specimens during the acute phase of infection. The lowest ?concentration of SARS-CoV-2 viral copies this assay can detect is ?138 copies/mL. A negative result does not preclude SARS-Cov-2 ?infection and should not be used as the sole basis for treatment or ?other patient management decisions. A negative result may occur with  ?improper specimen collection/handling, submission of specimen other ?than nasopharyngeal swab, presence of viral mutation(s) within the ?areas targeted by this assay, and inadequate  number of viral ?copies(<138 copies/mL). A negative result must be combined with ?clinical observations, patient history, and epidemiological ?information. The expected result is Negative. ? ?Fact Sheet for Patients:  ?EntrepreneurPulse.com.au ? ?Fact Sheet for Healthcare Providers:  ?IncredibleEmployment.be ? ?This test is no t yet approved or cleared by the [place] FDA and  ?has been authorized for detection and/or diagnosis of SARS-CoV-2 by ?FDA under an Emergency Use Authorization (EUA). This EUA will remain  ?in effect (meaning this test can be used) for the duration of the ?COVID-19 declaration under Section 564(b)(1) of the Act, 21 ?U.S.C.section 360bbb-3(b)(1), unless the authorization is terminated  ?or revoked sooner.  ? ? ?  ? Influenza A by PCR NEGATIVE NEGATIVE Final  ? Influenza B by PCR NEGATIVE NEGATIVE Final  ?  Comment: (NOTE) ?The Xpert Xpress SARS-CoV-2/FLU/RSV plus assay is intended as an aid ?in the diagnosis of influenza from Nasopharyngeal swab specimens and ?should not be used as a sole basis for treatment. Nasal washings and ?aspirates are unacceptable for Xpert Xpress SARS-CoV-2/FLU/RSV ?testing. ? ?  Fact Sheet for Patients: ?EntrepreneurPulse.com.au ? ?Fact Sheet for Healthcare Providers: ?IncredibleEmployment.be ? ?This test is not yet approved or cleared by the [place] FDA and ?has been authorized for detection and/or diagnosis of SARS-CoV-2 by ?FDA under an Emergency Use Authorization (EUA). This EUA will remain ?in effect (meaning this test can be used) for the duration of the ?COVID-19 declaration under Section 564(b)(1) of the Act, 21 U.S.C. ?section 360bbb-3(b)(1), unless the authorization is terminated or ?revoked. ? ?Performed at [facility], [place] [address], [place], [place] ?[address] ?  ?  ? ?Radiology Studies: ?US Renal Transplant w/Doppler ? ?Result Date: 06/15/2021 ?CLINICAL DATA:  Acute  kidney injury EXAM: ULTRASOUND OF RENAL TRANSPLANT WITH RENAL DOPPLER ULTRASOUND TECHNIQUE: Ultrasound examination of the renal transplant was performed with gray-scale, color and duplex doppler evaluati

## 2021-06-16 NOTE — Progress Notes (Signed)
Patient has been having elevated blood pressures post dialysis.Prn hydralazine 5 mg iv  given with no effect, Md made aware.  ?

## 2021-06-16 NOTE — Progress Notes (Signed)
Received report from Hemodialysis nurse. Was informed 4L was removed and patient tolerated treatment well.  ?

## 2021-06-16 NOTE — Progress Notes (Signed)
Nephrology Follow-Up Consult note ? ? ?Assessment/Recommendations: Karen [name] is a/an 66 y.o. female with a past medical history significant for ESRD s/p renal tx in 2005 and 2011, HTN GERD, and CKD, admitted for volume overload and advancing CKD.    ? ? ?Acute hypoxic respiratory failure: Likely related to volume overload in the setting of renal failure.  No longer on BiPAP this morning.  Blood pressure remains very elevated.  Planning for further dialysis as below ? 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  Patient is understanding of this.  We will continue diuretics and dialysis as needed over the weekend.  If she does not improve by Monday likely need to set up outpatient dialysis ?-IV Lasix again today ?-Plan for dialysis again today ?-Reevaluate needs for dialysis again tomorrow ?-Continue to monitor daily Cr, Dose meds for GFR ?-Monitor Daily I/Os, Daily weight  ?-Maintain MAP>65 for optimal renal perfusion.  ?-Avoid nephrotoxic medications including NSAIDs ?-Use synthetic opioids (Fentanyl/Dilaudid) if needed ?-Plan for CLIP next week pending course ? ?H/o Renal Transplant: possibly now ESRD. Given leukopenia will decrease meycophenolate to '250mg'$  bid. Check tac level and likely decrease dose if she remains HD dependent. ? ?Anemia of CKD:  managed with outpt ESA.  Hb 9.4 today, will cont ESA inpatient. Check iron stores ?  ?HTN:  Cont home meds and aggressive diuresis.  Wean NTG as tolerated. HD for volume removal as above ?  ?Leukopenia:  chronic, thought immunosuppresion related.  H/o BMBx - unrevealing.  Will decrease mycophenolate to '250mg'$  BID. ?  ?Hyperkalemia:  improved with lokelma  May hold bactrim if wean immunosuppression.  ?  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 Ionized ca low - supplement.  ? ? ?Recommendations conveyed to primary service.  ? ? ?Reesa Chew ?[facility] ?06/16/2021 ?10:27  AM ? ?___________________________________________________________ ? ?CC: sob ? ?Interval History/Subjective: Remains hypertensive and volume overloaded.  Minimal urine output documented.  Patient states she is starting to feel better.  Less shortness of breath. ? ? ?Medications:  ?Current Facility-Administered Medications  ?Medication Dose Route Frequency Provider Last Rate Last Admin  ? acetaminophen (TYLENOL) tablet 650 mg  650 mg Oral Q6H PRN Karmen Bongo, MD      ? Or  ? acetaminophen (TYLENOL) suppository 650 mg  650 mg Rectal Q6H PRN Karmen Bongo, MD      ? albuterol (PROVENTIL) (2.5 MG/3ML) 0.083% nebulizer solution 2.5 mg  2.5 mg Nebulization Q2H PRN Karmen Bongo, MD   2.5 mg at 06/15/21 1446  ? amLODipine (NORVASC) tablet 10 mg  10 mg Oral Daily Karmen Bongo, MD   10 mg at 06/16/21 0656  ? aspirin EC tablet 81 mg  81 mg Oral Daily Karmen Bongo, MD      ? calcitRIOL (ROCALTROL) capsule 0.25 mcg  0.25 mcg Oral Daily Karmen Bongo, MD   0.25 mcg at 06/16/21 5638  ? calcium carbonate (TUMS - dosed in mg elemental calcium) chewable tablet 500 mg of elemental calcium  500 mg of elemental calcium Oral Q6H PRN Karmen Bongo, MD   500 mg of elemental calcium at 06/15/21 1533  ? camphor-menthol (SARNA) lotion 1 application.  1 application. Topical Q8H PRN Karmen Bongo, MD      ? And  ? hydrOXYzine (ATARAX) tablet 25 mg  25 mg Oral Q8H PRN Karmen Bongo, MD      ? Chlorhexidine Gluconate Cloth 2 % PADS 6 each  6 each Topical Q0600 Justin Mend, MD      ?  docusate sodium (ENEMEEZ) enema 283 mg  1 enema Rectal PRN Karmen Bongo, MD      ? feeding supplement (NEPRO CARB STEADY) liquid 237 mL  237 mL Oral TID PRN Karmen Bongo, MD      ? furosemide (LASIX) 160 mg in dextrose 5 % 50 mL IVPB  160 mg Intravenous Q6H Reesa Chew, MD 66 mL/hr at 06/16/21 0905 160 mg at 06/16/21 0905  ? heparin injection 5,000 Units  5,000 Units Subcutaneous Lynne Logan, MD   5,000 Units at  06/16/21 2620  ? hydrALAZINE (APRESOLINE) injection 5 mg  5 mg Intravenous Q4H PRN Karmen Bongo, MD   5 mg at 06/16/21 0254  ? hydrALAZINE (APRESOLINE) tablet 75 mg  75 mg Oral TID Domenic Polite, MD      ? labetalol (NORMODYNE) tablet 200 mg  200 mg Oral BID Domenic Polite, MD      ? magnesium oxide (MAG-OX) tablet 400 mg  400 mg Oral BID Karmen Bongo, MD   400 mg at 06/15/21 1534  ? mycophenolate (CELLCEPT) capsule 250 mg  250 mg Oral BID Reesa Chew, MD   250 mg at 06/16/21 3559  ? ondansetron (ZOFRAN) tablet 4 mg  4 mg Oral Q6H PRN Karmen Bongo, MD      ? Or  ? ondansetron [facility]) injection 4 mg  4 mg Intravenous Q6H PRN Karmen Bongo, MD   4 mg at 06/15/21 1556  ? pantoprazole (PROTONIX) EC tablet 40 mg  40 mg Oral Daily Karmen Bongo, MD   40 mg at 06/16/21 0851  ? predniSONE (DELTASONE) tablet 5 mg  5 mg Oral Daily Karmen Bongo, MD   5 mg at 06/16/21 0851  ? simvastatin (ZOCOR) tablet 20 mg  20 mg Oral QPM Karmen Bongo, MD   20 mg at 06/15/21 1803  ? sodium bicarbonate tablet 650 mg  650 mg Oral BID Karmen Bongo, MD   650 mg at 06/16/21 0851  ? sodium chloride flush (NS) 0.9 % injection 3 mL  3 mL Intravenous Q12H Karmen Bongo, MD   3 mL at 06/16/21 0853  ? sorbitol 70 % solution 30 mL  30 mL Oral PRN Karmen Bongo, MD      ? sulfamethoxazole-trimethoprim (BACTRIM) 400-80 MG per tablet 1 tablet  1 tablet Oral Once per day on Mon Wed Fri Yates, Jennifer, MD   1 tablet at 06/16/21 [phone]  ? tacrolimus (PROGRAF) capsule 4 mg  4 mg Oral BID Karmen Bongo, MD   4 mg at 06/16/21 3845  ? zolpidem (AMBIEN) tablet 5 mg  5 mg Oral QHS PRN Karmen Bongo, MD      ?  ? ? ?Review of Systems: ?10 systems reviewed and negative except per interval history/subjective ? ?Physical Exam: ?Vitals:  ? 06/16/21 0752 06/16/21 0846  ?BP: (!) 180/50 (!) 150/80  ?Pulse:    ?Resp:    ?Temp:    ?SpO2:    ? ?Total I/O ?In: 120 [P.O.:120] ?Out: -  ? ?Intake/Output Summary (Last 24 hours) at 06/16/2021  1027 ?Last data filed at 06/16/2021 3646 ?Gross per 24 hour  ?Intake 340 ml  ?Output 4012 ml  ?Net -3672 ml  ? ?Constitutional: well-appearing, no acute distress ?ENMT: ears and nose without scars or lesions, MMM ?CV: normal rate, 2+ pitting edema in the bilateral lower extremities ?Respiratory: Bilateral chest rise with mild increased work of breathing ?Gastrointestinal: soft, non-tender, no palpable masses or hernias ?Skin: no visible lesions or rashes ?Psych: alert,  judgement/insight appropriate, appropriate mood and affect ? ? ?Test Results ?I personally reviewed new and old clinical labs and radiology tests ?Lab Results  ?Component Value Date  ? NA 138 06/16/2021  ? K 4.9 06/16/2021  ? CL 102 06/16/2021  ? CO2 25 06/16/2021  ? BUN 56 (H) 06/16/2021  ? CREATININE 3.95 (H) 06/16/2021  ? CALCIUM 7.9 (L) 06/16/2021  ? ALBUMIN 3.2 (L) 06/15/2021  ? PHOS 3.9 05/14/2019  ? ? ?CBC ?Recent Labs  ?Lab 06/15/21 ?6286 06/15/21 ?3817 06/16/21 ?7116  ?WBC  --  3.6* 1.9*  ?NEUTROABS  --  1.7  --   ?HGB 10.2* 9.3* 9.4*  ?HCT 30.0* 31.2* 29.9*  ?MCV  --  91.8 87.7  ?PLT  --  234 212  ? ? ? ? ? ?

## 2021-06-17 DIAGNOSIS — E8770 Fluid overload, unspecified: Secondary | ICD-10-CM | POA: Diagnosis not present

## 2021-06-17 DIAGNOSIS — N289 Disorder of kidney and ureter, unspecified: Secondary | ICD-10-CM | POA: Diagnosis not present

## 2021-06-17 LAB — CBC
HCT: 27.4 % — ABNORMAL LOW (ref 36.0–46.0)
Hemoglobin: 8.3 g/dL — ABNORMAL LOW (ref 12.0–15.0)
MCH: 27.1 pg (ref 26.0–34.0)
MCHC: 30.3 g/dL (ref 30.0–36.0)
MCV: 89.5 fL (ref 80.0–100.0)
Platelets: 168 10*3/uL (ref 150–400)
RBC: 3.06 MIL/uL — ABNORMAL LOW (ref 3.87–5.11)
RDW: 15.9 % — ABNORMAL HIGH (ref 11.5–15.5)
WBC: 1.9 10*3/uL — ABNORMAL LOW (ref 4.0–10.5)
nRBC: 0 % (ref 0.0–0.2)

## 2021-06-17 LAB — BASIC METABOLIC PANEL
Anion gap: 12 (ref 5–15)
BUN: 37 mg/dL — ABNORMAL HIGH (ref 8–23)
CO2: 27 mmol/L (ref 22–32)
Calcium: 7.9 mg/dL — ABNORMAL LOW (ref 8.9–10.3)
Chloride: 101 mmol/L (ref 98–111)
Creatinine, Ser: 3.26 mg/dL — ABNORMAL HIGH (ref 0.44–1.00)
GFR, Estimated: 15 mL/min — ABNORMAL LOW (ref >60)
Glucose, Bld: 85 mg/dL (ref 70–99)
Potassium: 4.4 mmol/L (ref 3.5–5.1)
Sodium: 140 mmol/L (ref 135–145)

## 2021-06-17 LAB — TACROLIMUS LEVEL: Tacrolimus (FK506) - LabCorp: 3.8 ng/mL (ref 2.0–20.0)

## 2021-06-17 MED ORDER — SODIUM CHLORIDE 0.9 % IV SOLN
250.0000 mg | Freq: Every day | INTRAVENOUS | Status: AC
  Administered 2021-06-17 – 2021-06-18 (×2): 250 mg via INTRAVENOUS
  Filled 2021-06-17 (×2): qty 20

## 2021-06-17 MED ORDER — LOSARTAN POTASSIUM 50 MG PO TABS
50.0000 mg | ORAL_TABLET | Freq: Every day | ORAL | Status: DC
  Administered 2021-06-18 – 2021-06-20 (×3): 50 mg via ORAL
  Filled 2021-06-17 (×3): qty 1

## 2021-06-17 NOTE — Progress Notes (Signed)
?PROGRESS NOTE ? ? ? ?Karen [name]  XUX:[id number] DOB: 07-04-1955 DOA: 06/15/2021 ?PCP: Edrick Oh, MD  ?Narrative; 65/F with history of ESRD, status post renal transplant in 2005 in 2011, hypertension, progressive CKD4, followed by transplant team at [facility] and Dr. Justin Mend at [facility] was admitted with acute hypoxic respiratory failure secondary to pulmonary edema, hypertensive emergency, creatinine of 5.7, she had a AV fistula from before. ?-Nephrology consulted, started HD 3/16 ? ? ?Subjective: Feels better, breathing is improving, came off BiPAP last night ? ?Assessment and Plan: ? ?Pulmonary edema ?Acute hypoxic respiratory failure ?Progressive CKD 5 ?-History of renal transplant x2, has a functioning AV fistula ?-Required BiPAP on admission, now off, ?-Considered ESRD, started dialysis 3/16, repeat 3/17 ?-Appreciate nephrology input ?-Plan for HD again today, wean down O2 ?-Increase activity, PT eval ? ?Hypertensive crisis ?-Improving, continue amlodipine, labetalol, hydralazine dose increased ?-Losartan started by nephrology today, monitor potassium closely ? ?Renal transplant recipient ?-She remains on prednisone, tacrolimus, and mycophenolate  ?-On Bactrim for PCP prophylaxis ? ?Anemia of chronic disease ?-Iron stores okay, continue EPO with HD ? ?Leukopenia ?-Likely secondary to immunomodulators, wean them down slowly, per nephrology ? 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 Seizure a year ago with negative EEG ?-She is not currently taking medications for this issue ? ?Dyslipidemia ?-Continue Zocor ? ?DVT prophylaxis: Heparin subcutaneous ?Code Status: Full code ?Family Communication: Discussed with patient in detail, no family at bedside ?Disposition Plan: Home likely 2 to 3 days ? ?Consultants:  ?Nephrology ? ?Procedures:  ? ?Antimicrobials:  ? ? ?Objective: ?Vitals:  ? 06/17/21  0644 06/17/21 0717 06/17/21 1054 06/17/21 1113  ?BP:  (!) 189/72  (!) 155/52  ?Pulse: 73 76  69  ?Resp:  17  17  ?Temp:  98.3 ?F (36.8 ?C)  98.3 ?F (36.8 ?C)  ?TempSrc:  Oral  Oral  ?SpO2:  94% 92% 90%  ?Weight:      ?Height:      ? ? ?Intake/Output Summary (Last 24 hours) at 06/17/2021 1148 ?Last data filed at 06/17/2021 0836 ?Gross per 24 hour  ?Intake 763 ml  ?Output 4105 ml  ?Net -3342 ml  ? ?Filed Weights  ? 06/16/21 0340 06/16/21 1455 06/16/21 1828  ?Weight: 74 kg 74.6 kg 70.6 kg  ? ? ?Examination: ? ?General exam: Pleasant obese female sitting up in bed, AAOx3, no distress ?HEENT: Positive JVD ?CVS: S1-S2, regular rhythm ?Lungs: Decreased breath sounds bases, few basilar rales ?Abdomen: Soft, nontender, bowel sounds present, improving abdominal wall edema ?Extremities: 1+ edema  ?Skin: No rashes ?Psychiatry: Judgement and insight appear normal. Mood & affect appropriate.  ? ? ? ?Data Reviewed:  ? ?CBC: ?Recent Labs  ?Lab 06/15/21 ?0816 06/15/21 ?9166 06/15/21 ?0600 06/16/21 ?4599 06/17/21 ?0416  ?WBC  --   --  3.6* 1.9* 1.9*  ?NEUTROABS  --   --  1.7  --   --   ?HGB 10.9* 10.2* 9.3* 9.4* 8.3*  ?HCT 32.0* 30.0* 31.2* 29.9* 27.4*  ?MCV  --   --  91.8 87.7 89.5  ?PLT  --   --  234 212 168  ? ?Basic Metabolic Panel: ?Recent Labs  ?Lab 06/15/21 ?0816 06/15/21 ?7741 06/15/21 ?4239 06/16/21 ?5320 06/17/21 ?0416  ?NA 139 140 141 138 140  ?K 5.2* 5.3* 5.2* 4.9 4.4  ?CL 108  --  104 102 101  ?CO2  --   --  '24 25 27  '$ ?  GLUCOSE 128*  --  135* 98 85  ?BUN 99*  --  92* 56* 37*  ?CREATININE 5.70*  --  5.48* 3.95* 3.26*  ?CALCIUM  --   --  7.1* 7.9* 7.9*  ? ?GFR: ?Estimated Creatinine Clearance: 15.7 mL/min (A) (by C-G formula based on SCr of 3.26 mg/dL (H)). ?Liver Function Tests: ?Recent Labs  ?Lab 06/15/21 ?0823  ?AST 19  ?ALT 7  ?ALKPHOS 49  ?BILITOT 0.7  ?PROT 6.0*  ?ALBUMIN 3.2*  ? ?No results for input(s): LIPASE, AMYLASE in the last 168 hours. ?No results for input(s): AMMONIA in the last 168 hours. ?Coagulation  Profile: ?No results for input(s): INR, PROTIME in the last 168 hours. ?Cardiac Enzymes: ?No results for input(s): CKTOTAL, CKMB, CKMBINDEX, TROPONINI in the last 168 hours. ?BNP (last 3 results) ?No results for input(s): PROBNP in the last 8760 hours. ?HbA1C: ?No results for input(s): HGBA1C in the last 72 hours. ?CBG: ?No results for input(s): GLUCAP in the last 168 hours. ?Lipid Profile: ?No results for input(s): CHOL, HDL, LDLCALC, TRIG, CHOLHDL, LDLDIRECT in the last 72 hours. ?Thyroid Function Tests: ?No results for input(s): TSH, T4TOTAL, FREET4, T3FREE, THYROIDAB in the last 72 hours. ?Anemia Panel: ?Recent Labs  ?  06/15/21 ?1338  ?FERRITIN 456*  ?TIBC 259  ?IRON 55  ? ?Urine analysis: ?   ?Component Value Date/Time  ? [place] YELLOW 09/18/2012 2057  ? APPEARANCEUR CLEAR 09/18/2012 2057  ? LABSPEC 1.021 09/18/2012 2057  ? PHURINE 6.0 09/18/2012 2057  ? GLUCOSEU NEGATIVE 09/18/2012 2057  ? [place] NEGATIVE 09/18/2012 2057  ? [place] NEGATIVE 09/18/2012 2057  ? [place] NEGATIVE 09/18/2012 2057  ? PROTEINUR >300 (A) 09/18/2012 2057  ? UROBILINOGEN 0.2 09/18/2012 2057  ? NITRITE NEGATIVE 09/18/2012 2057  ? LEUKOCYTESUR NEGATIVE 09/18/2012 2057  ? ?Sepsis Labs: ?'@LABRCNTIP'$ (procalcitonin:4,lacticidven:4) ? ?) ?Recent Results (from the past 240 hour(s))  ?Resp Panel by RT-PCR (Flu A&B, Covid) Nasopharyngeal Swab     Status: None  ? Collection Time: 06/15/21  8:00 AM  ? Specimen: Nasopharyngeal Swab; Nasopharyngeal(NP) swabs in vial transport medium  ?Result Value Ref Range Status  ? SARS Coronavirus 2 by RT PCR NEGATIVE NEGATIVE Final  ?  Comment: (NOTE) ?SARS-CoV-2 target nucleic acids are NOT DETECTED. ? ?The SARS-CoV-2 RNA is generally detectable in upper respiratory ?specimens during the acute phase of infection. The lowest ?concentration of SARS-CoV-2 viral copies this assay can detect is ?138 copies/mL. A negative result does not preclude SARS-Cov-2 ?infection and should not be used as the sole basis  for treatment or ?other patient management decisions. A negative result may occur with  ?improper specimen collection/handling, submission of specimen other ?than nasopharyngeal swab, presence of viral mutation(s) within the ?areas targeted by this assay, and inadequate number of viral ?copies(<138 copies/mL). A negative result must be combined with ?clinical observations, patient history, and epidemiological ?information. The expected result is Negative. ? ?Fact Sheet for Patients:  ?EntrepreneurPulse.com.au ? ?Fact Sheet for Healthcare Providers:  ?IncredibleEmployment.be ? ?This test is no t yet approved or cleared by the [place] FDA and  ?has been authorized for detection and/or diagnosis of SARS-CoV-2 by ?FDA under an Emergency Use Authorization (EUA). This EUA will remain  ?in effect (meaning this test can be used) for the duration of the ?COVID-19 declaration under Section 564(b)(1) of the Act, 21 ?U.S.C.section 360bbb-3(b)(1), unless the authorization is terminated  ?or revoked sooner.  ? ? ?  ? Influenza A by PCR NEGATIVE NEGATIVE Final  ? Influenza B by PCR  NEGATIVE NEGATIVE Final  ?  Comment: (NOTE) ?The Xpert Xpress SARS-CoV-2/FLU/RSV plus assay is intended as an aid ?in the diagnosis of influenza from Nasopharyngeal swab specimens and ?should not be used as a sole basis for treatment. Nasal washings and ?aspirates are unacceptable for Xpert Xpress SARS-CoV-2/FLU/RSV ?testing. ? ?Fact Sheet for Patients: ?EntrepreneurPulse.com.au ? ?Fact Sheet for Healthcare Providers: ?IncredibleEmployment.be ? ?This test is not yet approved or cleared by the [place] FDA and ?has been authorized for detection and/or diagnosis of SARS-CoV-2 by ?FDA under an Emergency Use Authorization (EUA). This EUA will remain ?in effect (meaning this test can be used) for the duration of the ?COVID-19 declaration under Section 564(b)(1) of the Act, 21  U.S.C. ?section 360bbb-3(b)(1), unless the authorization is terminated or ?revoked. ? ?Performed at [facility], [place] [address] [place], [place] ?[address] ?  ?  ? ?Radiology Studies: ?US Renal Transp

## 2021-06-17 NOTE — Progress Notes (Addendum)
Prn  hydralazine given , repeat bp 191/67 MD made aware  ? ? 06/17/21 0400  ?Vitals  ?Temp 98.2 ?F (36.8 ?C)  ?Temp Source Oral  ?BP (!) 206/62  ?Pulse Rate 71  ?ECG Heart Rate 71  ?Resp 15  ?Level of Consciousness  ?Level of Consciousness Alert  ?MEWS COLOR  ?MEWS Score Color Yellow  ?Oxygen Therapy  ?SpO2 98 %  ?O2 Device Nasal Cannula  ?O2 Flow Rate (L/min) 1 L/min  ?Patient Activity (if Appropriate) In bed  ?Pain Assessment  ?Pain Scale 0-10  ?Pain Score 0  ?Glasgow Coma Scale  ?Eye Opening 4  ?Best Verbal Response (NON-intubated) 5  ?Best Motor Response 6  ?[place] Coma Scale Score 15  ?MEWS Score  ?MEWS Temp 0  ?MEWS Systolic 2  ?MEWS Pulse 0  ?MEWS RR 0  ?MEWS LOC 0  ?MEWS Score 2  ? ? ?

## 2021-06-17 NOTE — Progress Notes (Signed)
Nephrology Follow-Up Consult note ? ? ?Assessment/Recommendations: Karen [name] is a/an 66 y.o. female with a past medical history significant for ESRD s/p renal tx in 2005 and 2011, HTN GERD, and CKD, admitted for volume overload and advancing CKD.    ? ? ?Acute hypoxic respiratory failure: Likely related to volume overload in the setting of renal failure.  No longer on BiPAP.  Continues to be volume overloaded.  Optimizing with dialysis ? 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  Patient is understanding of this.  Minimal effect from diuresis.  Plan for dialysis again today for volume optimization ?-No Lasix for now given minimal response yesterday ?-Plan for dialysis again today and likely again on Monday ?-I think she has likely reached ESRD, discussed with patient, plan for clip on Monday ?-Monitor Daily I/Os, Daily weight  ?-Maintain MAP>65 for optimal renal perfusion.  ?-Avoid nephrotoxic medications including NSAIDs ?-Use synthetic opioids (Fentanyl/Dilaudid) if needed ?-Plan for CLIP next week pending course ? ?H/o Renal Transplant: possibly now ESRD. Given leukopenia decreased meycophenolate to '250mg'$  bid. F/u tac level and likely decrease dose based on results ? ?Anemia of CKD:  managed with outpt ESA and ESA continued here. '500mg'$  of iron ordered.  ?  ?HTN:  Cont home meds and aggressive diuresis.  Wean NTG as tolerated. HD for volume removal as above. Adding losartan '50mg'$  daily given she has likely reached ESRD ?  ?Leukopenia:  chronic, thought immunosuppresion related.  H/o BMBx - unrevealing.  Will decreased mycophenolate to '250mg'$  BID. ?  ?Hyperkalemia:  improved with lokelma  May hold bactrim if wean immunosuppression.  ?  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 Ionized ca low - supplement.  ? ? ?Recommendations conveyed to primary service.  ? ? ?Reesa Chew ?[facility] ?06/17/2021 ?9:32 AM ? ?___________________________________________________________ ? ?CC:  sob ? ?Interval History/Subjective: Patient continues to feel better with improved shortness of breath.  Minimal urine output documented but the patient states she urinated more than what was reported. ? ? ?Medications:  ?Current Facility-Administered Medications  ?Medication Dose Route Frequency Provider Last Rate Last Admin  ? acetaminophen (TYLENOL) tablet 650 mg  650 mg Oral Q6H PRN Karmen Bongo, MD      ? Or  ? acetaminophen (TYLENOL) suppository 650 mg  650 mg Rectal Q6H PRN Karmen Bongo, MD      ? albuterol (PROVENTIL) (2.5 MG/3ML) 0.083% nebulizer solution 2.5 mg  2.5 mg Nebulization Q2H PRN Karmen Bongo, MD   2.5 mg at 06/15/21 1446  ? amLODipine (NORVASC) tablet 10 mg  10 mg Oral Daily Karmen Bongo, MD   10 mg at 06/17/21 0762  ? aspirin EC tablet 81 mg  81 mg Oral Daily Karmen Bongo, MD   81 mg at 06/16/21 1248  ? calcitRIOL (ROCALTROL) capsule 0.25 mcg  0.25 mcg Oral Daily Karmen Bongo, MD   0.25 mcg at 06/16/21 2633  ? calcium carbonate (TUMS - dosed in mg elemental calcium) chewable tablet 500 mg of elemental calcium  500 mg of elemental calcium Oral Q6H PRN Karmen Bongo, MD   500 mg of elemental calcium at 06/15/21 1533  ? camphor-menthol (SARNA) lotion 1 application.  1 application. Topical Q8H PRN Karmen Bongo, MD      ? And  ? hydrOXYzine (ATARAX) tablet 25 mg  25 mg Oral Q8H PRN Karmen Bongo, MD      ? Chlorhexidine Gluconate Cloth 2 % PADS 6 each  6 each Topical Q0600 Justin Mend, MD  6 each at 06/16/21 1051  ? docusate sodium (ENEMEEZ) enema 283 mg  1 enema Rectal PRN Karmen Bongo, MD      ? feeding supplement (NEPRO CARB STEADY) liquid 237 mL  237 mL Oral TID PRN Karmen Bongo, MD      ? heparin injection 5,000 Units  5,000 Units Subcutaneous Q8H Karmen Bongo, MD   5,000 Units at 06/17/21 0550  ? hydrALAZINE (APRESOLINE) injection 5 mg  5 mg Intravenous Q4H PRN Karmen Bongo, MD   5 mg at 06/17/21 4496  ? hydrALAZINE (APRESOLINE) tablet 75 mg  75 mg  Oral TID Domenic Polite, MD   75 mg at 06/17/21 7591  ? labetalol (NORMODYNE) tablet 200 mg  200 mg Oral BID Domenic Polite, MD   200 mg at 06/17/21 0645  ? losartan (COZAAR) tablet 50 mg  50 mg Oral Daily Reesa Chew, MD      ? magnesium oxide (MAG-OX) tablet 400 mg  400 mg Oral BID Karmen Bongo, MD   400 mg at 06/16/21 1248  ? mycophenolate (CELLCEPT) capsule 250 mg  250 mg Oral BID Reesa Chew, MD   250 mg at 06/16/21 2224  ? ondansetron (ZOFRAN) tablet 4 mg  4 mg Oral Q6H PRN Karmen Bongo, MD      ? Or  ? ondansetron [facility]) injection 4 mg  4 mg Intravenous Q6H PRN Karmen Bongo, MD   4 mg at 06/15/21 1556  ? pantoprazole (PROTONIX) EC tablet 40 mg  40 mg Oral Daily Karmen Bongo, MD   40 mg at 06/16/21 0851  ? predniSONE (DELTASONE) tablet 5 mg  5 mg Oral Daily Karmen Bongo, MD   5 mg at 06/16/21 0851  ? simvastatin (ZOCOR) tablet 20 mg  20 mg Oral QPM Karmen Bongo, MD   20 mg at 06/15/21 1803  ? sodium bicarbonate tablet 650 mg  650 mg Oral BID Karmen Bongo, MD   650 mg at 06/16/21 2224  ? sodium chloride flush (NS) 0.9 % injection 3 mL  3 mL Intravenous Q12H Karmen Bongo, MD   3 mL at 06/16/21 2220  ? sorbitol 70 % solution 30 mL  30 mL Oral PRN Karmen Bongo, MD      ? sulfamethoxazole-trimethoprim (BACTRIM) 400-80 MG per tablet 1 tablet  1 tablet Oral Once per day on Mon Wed Fri Yates, Jennifer, MD   1 tablet at 06/16/21 6384  ? tacrolimus (PROGRAF) capsule 4 mg  4 mg Oral BID Karmen Bongo, MD   4 mg at 06/16/21 6659  ? zolpidem (AMBIEN) tablet 5 mg  5 mg Oral QHS PRN Karmen Bongo, MD      ?  ? ? ?Review of Systems: ?10 systems reviewed and negative except per interval history/subjective ? ?Physical Exam: ?Vitals:  ? 06/17/21 0644 06/17/21 0717  ?BP:  (!) 189/72  ?Pulse: 73 76  ?Resp:  17  ?Temp:  98.3 ?F (36.8 ?C)  ?SpO2:  94%  ? ?Total I/O ?In: 120 [P.O.:120] ?Out: -  ? ?Intake/Output Summary (Last 24 hours) at 06/17/2021 0932 ?Last data filed at 06/17/2021  0836 ?Gross per 24 hour  ?Intake 763 ml  ?Output 4105 ml  ?Net -3342 ml  ? ?Constitutional: well-appearing, no acute distress ?ENMT: ears and nose without scars or lesions, MMM ?CV: normal rate, 2+ pitting edema in the bilateral lower extremities ?Respiratory: Bilateral chest rise with mild increased work of breathing ?Gastrointestinal: soft, non-tender, no palpable masses or hernias ?Skin: no visible lesions or  rashes ?Psych: alert, judgement/insight appropriate, appropriate mood and affect ? ? ?Test Results ?I personally reviewed new and old clinical labs and radiology tests ?Lab Results  ?Component Value Date  ? NA 140 06/17/2021  ? K 4.4 06/17/2021  ? CL 101 06/17/2021  ? CO2 27 06/17/2021  ? BUN 37 (H) 06/17/2021  ? CREATININE 3.26 (H) 06/17/2021  ? CALCIUM 7.9 (L) 06/17/2021  ? ALBUMIN 3.2 (L) 06/15/2021  ? PHOS 3.9 05/14/2019  ? ? ?CBC ?Recent Labs  ?Lab 06/15/21 ?6016 06/16/21 ?0312 06/17/21 ?0416  ?WBC 3.6* 1.9* 1.9*  ?NEUTROABS 1.7  --   --   ?HGB 9.3* 9.4* 8.3*  ?HCT 31.2* 29.9* 27.4*  ?MCV 91.8 87.7 89.5  ?PLT 234 212 168  ? ? ? ? ? ?

## 2021-06-17 NOTE — Progress Notes (Signed)
Hydralazine , labetalol and amlodipine due at 10am rescheduled to NOW as order by Dr  Ninetta Lights.   ?

## 2021-06-18 DIAGNOSIS — E8770 Fluid overload, unspecified: Secondary | ICD-10-CM | POA: Diagnosis not present

## 2021-06-18 DIAGNOSIS — N289 Disorder of kidney and ureter, unspecified: Secondary | ICD-10-CM | POA: Diagnosis not present

## 2021-06-18 LAB — BASIC METABOLIC PANEL
Anion gap: 11 (ref 5–15)
BUN: 25 mg/dL — ABNORMAL HIGH (ref 8–23)
CO2: 27 mmol/L (ref 22–32)
Calcium: 7.6 mg/dL — ABNORMAL LOW (ref 8.9–10.3)
Chloride: 98 mmol/L (ref 98–111)
Creatinine, Ser: 2.71 mg/dL — ABNORMAL HIGH (ref 0.44–1.00)
GFR, Estimated: 19 mL/min — ABNORMAL LOW (ref >60)
Glucose, Bld: 91 mg/dL (ref 70–99)
Potassium: 3.5 mmol/L (ref 3.5–5.1)
Sodium: 136 mmol/L (ref 135–145)

## 2021-06-18 MED ORDER — TACROLIMUS 1 MG PO CAPS
2.0000 mg | ORAL_CAPSULE | Freq: Two times a day (BID) | ORAL | Status: DC
  Administered 2021-06-18 – 2021-06-21 (×7): 2 mg via ORAL
  Filled 2021-06-18 (×7): qty 2

## 2021-06-18 MED ORDER — HYDRALAZINE HCL 20 MG/ML IJ SOLN
5.0000 mg | Freq: Once | INTRAMUSCULAR | Status: AC
Start: 2021-06-18 — End: 2021-06-18
  Administered 2021-06-18: 5 mg via INTRAVENOUS

## 2021-06-18 NOTE — Progress Notes (Signed)
Nephrology Follow-Up Consult note ? ? ?Assessment/Recommendations: Karen [name] is a/an 66 y.o. female with a past medical history significant for ESRD s/p renal tx in 2005 and 2011, HTN GERD, and CKD, admitted for volume overload and advancing CKD.    ? ? ?Acute hypoxic respiratory failure: Likely related to volume overload in the setting of renal failure.  Volume status overall improved as well as respiratory status.  Continue volume removal with dialysis ? 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  Patient is understanding of this.  Minimal effect from diuresis.  Continue MWF schedule of dialysis ?-No Lasix for now given minimal response thus far ?-Maintain MWF schedule for dialysis ?-I think she has likely reached ESRD, discussed with patient, plan for clip on Monday ?-Monitor Daily I/Os, Daily weight  ?-Maintain MAP>65 for optimal renal perfusion.  ?-Avoid nephrotoxic medications including NSAIDs ?-Use synthetic opioids (Fentanyl/Dilaudid) if needed ? ?H/o Renal Transplant: possibly now ESRD. Given leukopenia decreased meycophenolate to '250mg'$  bid.  Tacrolimus level 3.8.  Lowered Tac to 2 mg twice daily.  Could consider stopping mycophenolate given leukopenia ? ?Anemia of CKD:  managed with outpt ESA and ESA continued here. '500mg'$  of iron ordered.  ?  ?HTN:  Cont home meds and aggressive diuresis.  Wean NTG as tolerated. HD for volume removal as above.  She did not get losartan yesterday. ?  ?Leukopenia:  chronic, thought immunosuppresion related.  H/o BMBx - unrevealing.  Mycophenolate dose was decreased to 250 mg twice daily.  Consider holding ?  ?Hyperkalemia:  improved with lokelma  May hold bactrim if wean immunosuppression.  ?  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 ? ?Recommendations conveyed to primary service.  ? ? ?Reesa Chew ?[facility] ?06/18/2021 ?9:20 AM ? ?___________________________________________________________ ? ?CC: sob ? ?Interval History/Subjective: Patient  continues to feel much better today.  Minimal urine output documented.  Tolerated dialysis yesterday with 4 L removed.  Remains hypertensive but did not get losartan yesterday ? ? ?Medications:  ?Current Facility-Administered Medications  ?Medication Dose Route Frequency Provider Last Rate Last Admin  ? acetaminophen (TYLENOL) tablet 650 mg  650 mg Oral Q6H PRN Karmen Bongo, MD      ? Or  ? acetaminophen (TYLENOL) suppository 650 mg  650 mg Rectal Q6H PRN Karmen Bongo, MD      ? albuterol (PROVENTIL) (2.5 MG/3ML) 0.083% nebulizer solution 2.5 mg  2.5 mg Nebulization Q2H PRN Karmen Bongo, MD   2.5 mg at 06/15/21 1446  ? amLODipine (NORVASC) tablet 10 mg  10 mg Oral Daily Karmen Bongo, MD   10 mg at 06/17/21 2774  ? aspirin EC tablet 81 mg  81 mg Oral Daily Karmen Bongo, MD   81 mg at 06/17/21 1042  ? calcitRIOL (ROCALTROL) capsule 0.25 mcg  0.25 mcg Oral Daily Karmen Bongo, MD   0.25 mcg at 06/17/21 1043  ? calcium carbonate (TUMS - dosed in mg elemental calcium) chewable tablet 500 mg of elemental calcium  500 mg of elemental calcium Oral Q6H PRN Karmen Bongo, MD   500 mg of elemental calcium at 06/15/21 1533  ? camphor-menthol (SARNA) lotion 1 application.  1 application. Topical Q8H PRN Karmen Bongo, MD      ? And  ? hydrOXYzine (ATARAX) tablet 25 mg  25 mg Oral Q8H PRN Karmen Bongo, MD      ? Chlorhexidine Gluconate Cloth 2 % PADS 6 each  6 each Topical Q0600 Justin Mend, MD   6 each at 06/17/21  1000  ? docusate sodium (ENEMEEZ) enema 283 mg  1 enema Rectal PRN Karmen Bongo, MD      ? feeding supplement (NEPRO CARB STEADY) liquid 237 mL  237 mL Oral TID PRN Karmen Bongo, MD      ? ferric gluconate (FERRLECIT) 250 mg in sodium chloride 0.9 % 250 mL IVPB  250 mg Intravenous Daily Reesa Chew, MD   Stopped at 06/17/21 1330  ? heparin injection 5,000 Units  5,000 Units Subcutaneous Lynne Logan, MD   5,000 Units at 06/18/21 0701  ? hydrALAZINE (APRESOLINE) injection  5 mg  5 mg Intravenous Q4H PRN Karmen Bongo, MD   5 mg at 06/18/21 0803  ? hydrALAZINE (APRESOLINE) tablet 75 mg  75 mg Oral TID Domenic Polite, MD   75 mg at 06/17/21 2221  ? labetalol (NORMODYNE) tablet 200 mg  200 mg Oral BID Domenic Polite, MD   200 mg at 06/17/21 2221  ? losartan (COZAAR) tablet 50 mg  50 mg Oral Daily Reesa Chew, MD      ? magnesium oxide (MAG-OX) tablet 400 mg  400 mg Oral BID Karmen Bongo, MD   400 mg at 06/16/21 1248  ? mycophenolate (CELLCEPT) capsule 250 mg  250 mg Oral BID Reesa Chew, MD   250 mg at 06/17/21 2222  ? ondansetron (ZOFRAN) tablet 4 mg  4 mg Oral Q6H PRN Karmen Bongo, MD      ? Or  ? ondansetron [facility]) injection 4 mg  4 mg Intravenous Q6H PRN Karmen Bongo, MD   4 mg at 06/15/21 1556  ? pantoprazole (PROTONIX) EC tablet 40 mg  40 mg Oral Daily Karmen Bongo, MD   40 mg at 06/17/21 1043  ? predniSONE (DELTASONE) tablet 5 mg  5 mg Oral Daily Karmen Bongo, MD   5 mg at 06/17/21 1042  ? simvastatin (ZOCOR) tablet 20 mg  20 mg Oral QPM Karmen Bongo, MD   20 mg at 06/17/21 1826  ? sodium chloride flush (NS) 0.9 % injection 3 mL  3 mL Intravenous Q12H Karmen Bongo, MD   3 mL at 06/17/21 1043  ? sorbitol 70 % solution 30 mL  30 mL Oral PRN Karmen Bongo, MD      ? sulfamethoxazole-trimethoprim (BACTRIM) 400-80 MG per tablet 1 tablet  1 tablet Oral Once per day on Mon Wed Fri Yates, Jennifer, MD   1 tablet at 06/16/21 [phone]  ? tacrolimus (PROGRAF) capsule 2 mg  2 mg Oral BID Reesa Chew, MD      ? zolpidem Lorrin Mais) tablet 5 mg  5 mg Oral QHS PRN Karmen Bongo, MD      ?  ? ? ?Review of Systems: ?10 systems reviewed and negative except per interval history/subjective ? ?Physical Exam: ?Vitals:  ? 06/18/21 0748 06/18/21 0900  ?BP: (!) 211/84 (!) 211/70  ?Pulse: 90   ?Resp: 18 19  ?Temp: 98.5 ?F (36.9 ?C)   ?SpO2: 93% 93%  ? ?Total I/O ?In: 240 [P.O.:240] ?Out: -  ? ?Intake/Output Summary (Last 24 hours) at 06/18/2021 0920 ?Last data filed  at 06/18/2021 7628 ?Gross per 24 hour  ?Intake 848.6 ml  ?Output 4400 ml  ?Net -3551.4 ml  ? ?Constitutional: well-appearing, no acute distress ?ENMT: ears and nose without scars or lesions, MMM ?CV: normal rate, 1+ pitting edema in the bilateral lower extremities ?Respiratory: Bilateral chest rise with no increased work of breathing ?Gastrointestinal: soft, non-tender, no palpable masses or hernias ?Skin: no  visible lesions or rashes ?Psych: alert, judgement/insight appropriate, appropriate mood and affect ? ? ?Test Results ?I personally reviewed new and old clinical labs and radiology tests ?Lab Results  ?Component Value Date  ? NA 136 06/18/2021  ? K 3.5 06/18/2021  ? CL 98 06/18/2021  ? CO2 27 06/18/2021  ? BUN 25 (H) 06/18/2021  ? CREATININE 2.71 (H) 06/18/2021  ? CALCIUM 7.6 (L) 06/18/2021  ? ALBUMIN 3.2 (L) 06/15/2021  ? PHOS 3.9 05/14/2019  ? ? ?CBC ?Recent Labs  ?Lab 06/15/21 ?6067 06/16/21 ?0312 06/17/21 ?0416  ?WBC 3.6* 1.9* 1.9*  ?NEUTROABS 1.7  --   --   ?HGB 9.3* 9.4* 8.3*  ?HCT 31.2* 29.9* 27.4*  ?MCV 91.8 87.7 89.5  ?PLT 234 212 168  ? ? ? ? ? ?

## 2021-06-18 NOTE — Plan of Care (Signed)
?  Problem: Education: ?Goal: Knowledge of General Education information will improve ?Description: Including pain rating scale, medication(s)/side effects and non-pharmacologic comfort measures ?Outcome: Progressing ?  ?Problem: Clinical Measurements: ?Goal: Respiratory complications will improve ?Outcome: Progressing ?  ?Problem: Clinical Measurements: ?Goal: Respiratory complications will improve ?Outcome: Progressing ?  ?

## 2021-06-18 NOTE — Progress Notes (Addendum)
Patients blood pressure remains elevated even after administration of scheduled and prn antihypertensive medication. Md made aware. ?Additional '5mg'$  off hydralazine ordered and administered.  ?

## 2021-06-18 NOTE — Progress Notes (Signed)
?PROGRESS NOTE ? ? ? ?Karen [name]  JSH:[id number] DOB: Sep 30, 1955 DOA: 06/15/2021 ?PCP: Edrick Oh, MD  ?Narrative; 65/F with history of ESRD, status post renal transplant in 2005 in 2011, hypertension, progressive CKD4, followed by transplant team at [facility] and Dr. Justin Mend at [facility] was admitted with acute hypoxic respiratory failure secondary to pulmonary edema, hypertensive emergency, creatinine of 5.7, she has an AV fistula  ?-Nephrology consulted, started HD 3/16 ? ? ?Subjective: Feels better overall, taken off oxygen, breathing is improving ? ?Assessment and Plan: ? ?Pulmonary edema ?Acute hypoxic respiratory failure ?Progressive CKD 5, new ESRD ?-History of renal transplant x2, has a functioning AV fistula ?-Required BiPAP on admission, now off, ?-Considered ESRD, started dialysis 3/16, followed by 3/17, 3/18 ?-Appreciate nephrology input ?-Plan for HD again tomorrow, weaned off O2 ?-Increase activity, PT eval ?-To start clear process for outpatient HD ? ?Hypertensive crisis ?-Improving, continue amlodipine, labetalol, hydralazine dose increased ?-Losartan started by nephrology yesterday, monitor potassium closely ? ?Renal transplant recipient ?-She remains on prednisone, tacrolimus, and mycophenolate  ?-On Bactrim for PCP prophylaxis ?-Tacrolimus and mycophenolate dose lowered ? ?Anemia of chronic disease ?-Iron stores okay, continue EPO with HD ? ?Leukopenia ?-secondary to immunomodulators, wean down slowly, per nephrology ? 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 Seizure a year ago with negative EEG ?-She is not currently taking medications for this issue ? ?Dyslipidemia ?-Continue Zocor ? ?DVT prophylaxis: Heparin subcutaneous ?Code Status: Full code ?Family Communication: Discussed with patient in detail, no family at bedside ?Disposition Plan: Home likely 2 to 3  days ? ?Consultants:  ?Nephrology ? ?Procedures:  ? ?Antimicrobials:  ? ? ?Objective: ?Vitals:  ? 06/18/21 0748 06/18/21 0900 06/18/21 1000 06/18/21 1140  ?BP: (!) 211/84 (!) 211/70 (!) 216/73 (!) 170/62  ?Pulse: 90   71  ?Resp: '18 19  18  '$ ?Temp: 98.5 ?F (36.9 ?C)   98.3 ?F (36.8 ?C)  ?TempSrc: Oral   Oral  ?SpO2: 93% 93%  95%  ?Weight:      ?Height:      ? ? ?Intake/Output Summary (Last 24 hours) at 06/18/2021 1145 ?Last data filed at 06/18/2021 8502 ?Gross per 24 hour  ?Intake 848.6 ml  ?Output 4400 ml  ?Net -3551.4 ml  ? ?Filed Weights  ? 06/17/21 1357 06/17/21 1730 06/18/21 0429  ?Weight: 69.5 kg 65.5 kg 65.5 kg  ? ? ?Examination: ? ?General exam: Pleasant obese female sitting up in bed, AAOx3, no distress ?HEENT: Positive JVD ?CVS: S1-S2, regular rate rhythm ?Lungs: Improved air movement, few basilar rales ?Abdomen: Soft, nontender, bowel sounds present ?Extremities: 1+ edema ?Skin: No rashes ?Psychiatry: Judgement and insight appear normal. Mood & affect appropriate.  ? ? ? ?Data Reviewed:  ? ?CBC: ?Recent Labs  ?Lab 06/15/21 ?0816 06/15/21 ?7741 06/15/21 ?2878 06/16/21 ?6767 06/17/21 ?0416  ?WBC  --   --  3.6* 1.9* 1.9*  ?NEUTROABS  --   --  1.7  --   --   ?HGB 10.9* 10.2* 9.3* 9.4* 8.3*  ?HCT 32.0* 30.0* 31.2* 29.9* 27.4*  ?MCV  --   --  91.8 87.7 89.5  ?PLT  --   --  234 212 168  ? ?Basic Metabolic Panel: ?Recent Labs  ?Lab 06/15/21 ?0816 06/15/21 ?2094 06/15/21 ?7096 06/16/21 ?2836 06/17/21 ?6294 06/18/21 ?7654  ?NA 139 140 141 138 140 136  ?K 5.2* 5.3* 5.2* 4.9 4.4 3.5  ?CL 108  --  104 102  101 98  ?CO2  --   --  '24 25 27 27  '$ ?GLUCOSE 128*  --  135* 98 85 91  ?BUN 99*  --  92* 56* 37* 25*  ?CREATININE 5.70*  --  5.48* 3.95* 3.26* 2.71*  ?CALCIUM  --   --  7.1* 7.9* 7.9* 7.6*  ? ?GFR: ?Estimated Creatinine Clearance: 18.2 mL/min (A) (by C-G formula based on SCr of 2.71 mg/dL (H)). ?Liver Function Tests: ?Recent Labs  ?Lab 06/15/21 ?0823  ?AST 19  ?ALT 7  ?ALKPHOS 49  ?BILITOT 0.7  ?PROT 6.0*  ?ALBUMIN 3.2*   ? ?No results for input(s): LIPASE, AMYLASE in the last 168 hours. ?No results for input(s): AMMONIA in the last 168 hours. ?Coagulation Profile: ?No results for input(s): INR, PROTIME in the last 168 hours. ?Cardiac Enzymes: ?No results for input(s): CKTOTAL, CKMB, CKMBINDEX, TROPONINI in the last 168 hours. ?BNP (last 3 results) ?No results for input(s): PROBNP in the last 8760 hours. ?HbA1C: ?No results for input(s): HGBA1C in the last 72 hours. ?CBG: ?No results for input(s): GLUCAP in the last 168 hours. ?Lipid Profile: ?No results for input(s): CHOL, HDL, LDLCALC, TRIG, CHOLHDL, LDLDIRECT in the last 72 hours. ?Thyroid Function Tests: ?No results for input(s): TSH, T4TOTAL, FREET4, T3FREE, THYROIDAB in the last 72 hours. ?Anemia Panel: ?Recent Labs  ?  06/15/21 ?1338  ?FERRITIN 456*  ?TIBC 259  ?IRON 55  ? ?Urine analysis: ?   ?Component Value Date/Time  ? [place] YELLOW 09/18/2012 2057  ? APPEARANCEUR CLEAR 09/18/2012 2057  ? LABSPEC 1.021 09/18/2012 2057  ? PHURINE 6.0 09/18/2012 2057  ? GLUCOSEU NEGATIVE 09/18/2012 2057  ? [place] NEGATIVE 09/18/2012 2057  ? [place] NEGATIVE 09/18/2012 2057  ? [place] NEGATIVE 09/18/2012 2057  ? PROTEINUR >300 (A) 09/18/2012 2057  ? UROBILINOGEN 0.2 09/18/2012 2057  ? NITRITE NEGATIVE 09/18/2012 2057  ? LEUKOCYTESUR NEGATIVE 09/18/2012 2057  ? ?Sepsis Labs: ?'@LABRCNTIP'$ (procalcitonin:4,lacticidven:4) ? ?) ?Recent Results (from the past 240 hour(s))  ?Resp Panel by RT-PCR (Flu A&B, Covid) Nasopharyngeal Swab     Status: None  ? Collection Time: 06/15/21  8:00 AM  ? Specimen: Nasopharyngeal Swab; Nasopharyngeal(NP) swabs in vial transport medium  ?Result Value Ref Range Status  ? SARS Coronavirus 2 by RT PCR NEGATIVE NEGATIVE Final  ?  Comment: (NOTE) ?SARS-CoV-2 target nucleic acids are NOT DETECTED. ? ?The SARS-CoV-2 RNA is generally detectable in upper respiratory ?specimens during the acute phase of infection. The lowest ?concentration of SARS-CoV-2 viral copies  this assay can detect is ?138 copies/mL. A negative result does not preclude SARS-Cov-2 ?infection and should not be used as the sole basis for treatment or ?other patient management decisions. A negative result may occur with  ?improper specimen collection/handling, submission of specimen other ?than nasopharyngeal swab, presence of viral mutation(s) within the ?areas targeted by this assay, and inadequate number of viral ?copies(<138 copies/mL). A negative result must be combined with ?clinical observations, patient history, and epidemiological ?information. The expected result is Negative. ? ?Fact Sheet for Patients:  ?EntrepreneurPulse.com.au ? ?Fact Sheet for Healthcare Providers:  ?IncredibleEmployment.be ? ?This test is no t yet approved or cleared by the [place] FDA and  ?has been authorized for detection and/or diagnosis of SARS-CoV-2 by ?FDA under an Emergency Use Authorization (EUA). This EUA will remain  ?in effect (meaning this test can be used) for the duration of the ?COVID-19 declaration under Section 564(b)(1) of the Act, 21 ?U.S.C.section 360bbb-3(b)(1), unless the authorization is terminated  ?or revoked sooner.  ? ? ?  ?  Influenza A by PCR NEGATIVE NEGATIVE Final  ? Influenza B by PCR NEGATIVE NEGATIVE Final  ?  Comment: (NOTE) ?The Xpert Xpress SARS-CoV-2/FLU/RSV plus assay is intended as an aid ?in the diagnosis of influenza from Nasopharyngeal swab specimens and ?should not be used as a sole basis for treatment. Nasal washings and ?aspirates are unacceptable for Xpert Xpress SARS-CoV-2/FLU/RSV ?testing. ? ?Fact Sheet for Patients: ?EntrepreneurPulse.com.au ? ?Fact Sheet for Healthcare Providers: ?IncredibleEmployment.be ? ?This test is not yet approved or cleared by the [place] FDA and ?has been authorized for detection and/or diagnosis of SARS-CoV-2 by ?FDA under an Emergency Use Authorization (EUA). This EUA  will remain ?in effect (meaning this test can be used) for the duration of the ?COVID-19 declaration under Section 564(b)(1) of the Act, 21 U.S.C. ?section 360bbb-3(b)(1), unless the authorization is terminated or ?revo

## 2021-06-19 DIAGNOSIS — N289 Disorder of kidney and ureter, unspecified: Secondary | ICD-10-CM | POA: Diagnosis not present

## 2021-06-19 DIAGNOSIS — E8770 Fluid overload, unspecified: Secondary | ICD-10-CM | POA: Diagnosis not present

## 2021-06-19 LAB — CBC
HCT: 27.8 % — ABNORMAL LOW (ref 36.0–46.0)
Hemoglobin: 8.3 g/dL — ABNORMAL LOW (ref 12.0–15.0)
MCH: 26.9 pg (ref 26.0–34.0)
MCHC: 29.9 g/dL — ABNORMAL LOW (ref 30.0–36.0)
MCV: 90 fL (ref 80.0–100.0)
Platelets: 148 10*3/uL — ABNORMAL LOW (ref 150–400)
RBC: 3.09 MIL/uL — ABNORMAL LOW (ref 3.87–5.11)
RDW: 15.3 % (ref 11.5–15.5)
WBC: 1.9 10*3/uL — ABNORMAL LOW (ref 4.0–10.5)
nRBC: 0 % (ref 0.0–0.2)

## 2021-06-19 LAB — RENAL FUNCTION PANEL
Albumin: 2.7 g/dL — ABNORMAL LOW (ref 3.5–5.0)
Anion gap: 9 (ref 5–15)
BUN: 29 mg/dL — ABNORMAL HIGH (ref 8–23)
CO2: 28 mmol/L (ref 22–32)
Calcium: 7.8 mg/dL — ABNORMAL LOW (ref 8.9–10.3)
Chloride: 101 mmol/L (ref 98–111)
Creatinine, Ser: 3.46 mg/dL — ABNORMAL HIGH (ref 0.44–1.00)
GFR, Estimated: 14 mL/min — ABNORMAL LOW (ref >60)
Glucose, Bld: 96 mg/dL (ref 70–99)
Phosphorus: 3.2 mg/dL (ref 2.5–4.6)
Potassium: 3.5 mmol/L (ref 3.5–5.1)
Sodium: 138 mmol/L (ref 135–145)

## 2021-06-19 MED ORDER — HYDRALAZINE HCL 50 MG PO TABS
100.0000 mg | ORAL_TABLET | Freq: Three times a day (TID) | ORAL | Status: DC
  Administered 2021-06-19 – 2021-06-21 (×7): 100 mg via ORAL
  Filled 2021-06-19 (×8): qty 2

## 2021-06-19 MED ORDER — LABETALOL HCL 200 MG PO TABS
300.0000 mg | ORAL_TABLET | Freq: Two times a day (BID) | ORAL | Status: DC
  Administered 2021-06-19 – 2021-06-21 (×5): 300 mg via ORAL
  Filled 2021-06-19 (×5): qty 1

## 2021-06-19 MED ORDER — HYDRALAZINE HCL 20 MG/ML IJ SOLN
5.0000 mg | Freq: Once | INTRAMUSCULAR | Status: AC
Start: 2021-06-19 — End: 2021-06-19
  Administered 2021-06-19: 5 mg via INTRAVENOUS

## 2021-06-19 MED ORDER — HYDRALAZINE HCL 20 MG/ML IJ SOLN
10.0000 mg | INTRAMUSCULAR | Status: DC | PRN
Start: 2021-06-19 — End: 2021-06-21
  Administered 2021-06-19 – 2021-06-21 (×5): 10 mg via INTRAVENOUS
  Filled 2021-06-19 (×5): qty 1

## 2021-06-19 NOTE — Progress Notes (Signed)
Requested to see pt to assist with out-pt HD needs at d/c. Met with pt at bedside while receiving HD to introduce self and explain role. Pt has out-pt HD hx at [facility] and prefers to return to clinic if possible. Referral made to [facility] admissions today. Pt plans to drive self to HD appts. Pt working F/T prior to admission. Will assist as needed.  ? ?Melven Sartorius ?Renal Navigator ?[phone] ?

## 2021-06-19 NOTE — Plan of Care (Signed)

## 2021-06-19 NOTE — Progress Notes (Signed)
Patient declined BIPAP use. No distress noted. RT will monitor as needed. ?

## 2021-06-19 NOTE — Progress Notes (Signed)
Patient's BP remains elevated despite receiving scheduled antihypertensives and PRN Hydralazine. MD notified. Additional dose of Hydralazine ordered and given. ?

## 2021-06-19 NOTE — Progress Notes (Signed)
Nephrology Follow-Up Consult note ? ? ?Assessment/Recommendations: Karen [name] is a/an 66 y.o. female with a past medical history significant for ESRD s/p renal tx in 2005 and 2011, HTN GERD, and CKD, admitted for volume overload and advancing CKD.    ? ? ?Acute hypoxic respiratory failure: Likely related to volume overload in the setting of renal failure.  Volume status overall improved as well as respiratory status.  Continue volume removal with dialysis ? ?CKD V, now ESRD: Patient is understanding of this.  Minimal effect from diuresis.  Continue MWF schedule of dialysis. CLIP in process ?-No Lasix for now given minimal response thus far ?-Maintain MWF schedule for dialysis ?-Monitor Daily I/Os, Daily weight  ?-Maintain MAP>65 for optimal renal perfusion.  ?-Avoid nephrotoxic medications including NSAIDs ?-Use synthetic opioids (Fentanyl/Dilaudid) if needed ? ?H/o Renal Transplant: possibly now ESRD. Given leukopenia decreased meycophenolate to '250mg'$  bid.  Tacrolimus level 3.8.  Lowered Tac to 2 mg twice daily.  Could consider stopping mycophenolate given leukopenia ? ?Anemia of CKD:  managed with outpt ESA and ESA continued here. '500mg'$  of iron ordered.  ?  ?HTN:  Cont home meds and aggressive diuresis.  Wean NTG as tolerated. HD for volume removal as above.  She did not get losartan yesterday. ?  ?Leukopenia:  chronic, thought immunosuppresion related.  H/o BMBx - unrevealing.  Mycophenolate dose was decreased to 250 mg twice daily.  Consider holding ?  ?Hyperkalemia:  improved with lokelma  May hold bactrim if wean immunosuppression.  ?  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 ?Kearstyn Avitia [name] ?[facility] ?06/19/2021 ?11:14 AM ? ?___________________________________________________________ ? ?CC: sob ? ?Interval History/Subjective: patient seen and examined on HD. Tolerating treatment. UFG 4500. No complaints. ? ? ?Medications:  ?Current Facility-Administered Medications  ?Medication Dose Route  Frequency Provider Last Rate Last Admin  ? acetaminophen (TYLENOL) tablet 650 mg  650 mg Oral Q6H PRN Karmen Bongo, MD      ? Or  ? acetaminophen (TYLENOL) suppository 650 mg  650 mg Rectal Q6H PRN Karmen Bongo, MD      ? albuterol (PROVENTIL) (2.5 MG/3ML) 0.083% nebulizer solution 2.5 mg  2.5 mg Nebulization Q2H PRN Karmen Bongo, MD   2.5 mg at 06/15/21 1446  ? amLODipine (NORVASC) tablet 10 mg  10 mg Oral Daily Karmen Bongo, MD   10 mg at 06/18/21 0919  ? aspirin EC tablet 81 mg  81 mg Oral Daily Karmen Bongo, MD   81 mg at 06/18/21 4315  ? calcitRIOL (ROCALTROL) capsule 0.25 mcg  0.25 mcg Oral Daily Karmen Bongo, MD   0.25 mcg at 06/18/21 4008  ? calcium carbonate (TUMS - dosed in mg elemental calcium) chewable tablet 500 mg of elemental calcium  500 mg of elemental calcium Oral Q6H PRN Karmen Bongo, MD   500 mg of elemental calcium at 06/15/21 1533  ? camphor-menthol (SARNA) lotion 1 application.  1 application. Topical Q8H PRN Karmen Bongo, MD      ? And  ? hydrOXYzine (ATARAX) tablet 25 mg  25 mg Oral Q8H PRN Karmen Bongo, MD      ? Chlorhexidine Gluconate Cloth 2 % PADS 6 each  6 each Topical Q0600 Justin Mend, MD   6 each at 06/19/21 [phone]  ? docusate sodium (ENEMEEZ) enema 283 mg  1 enema Rectal PRN Karmen Bongo, MD      ? feeding supplement (NEPRO CARB STEADY) liquid 237 mL  237 mL Oral TID PRN Karmen Bongo, MD      ?  heparin injection 5,000 Units  5,000 Units Subcutaneous Lynne Logan, MD   5,000 Units at 06/19/21 0503  ? hydrALAZINE (APRESOLINE) injection 5 mg  5 mg Intravenous Q4H PRN Karmen Bongo, MD   5 mg at 06/19/21 0308  ? hydrALAZINE (APRESOLINE) tablet 100 mg  100 mg Oral TID Domenic Polite, MD      ? labetalol (NORMODYNE) tablet 300 mg  300 mg Oral BID Domenic Polite, MD      ? losartan (COZAAR) tablet 50 mg  50 mg Oral Daily Reesa Chew, MD   50 mg at 06/18/21 1093  ? magnesium oxide (MAG-OX) tablet 400 mg  400 mg Oral BID Karmen Bongo,  MD   400 mg at 06/18/21 1709  ? mycophenolate (CELLCEPT) capsule 250 mg  250 mg Oral BID Reesa Chew, MD   250 mg at 06/18/21 1949  ? ondansetron (ZOFRAN) tablet 4 mg  4 mg Oral Q6H PRN Karmen Bongo, MD      ? Or  ? ondansetron [facility]) injection 4 mg  4 mg Intravenous Q6H PRN Karmen Bongo, MD   4 mg at 06/15/21 1556  ? pantoprazole (PROTONIX) EC tablet 40 mg  40 mg Oral Daily Karmen Bongo, MD   40 mg at 06/18/21 0919  ? predniSONE (DELTASONE) tablet 5 mg  5 mg Oral Daily Karmen Bongo, MD   5 mg at 06/18/21 0919  ? simvastatin (ZOCOR) tablet 20 mg  20 mg Oral QPM Karmen Bongo, MD   20 mg at 06/18/21 1711  ? sodium chloride flush (NS) 0.9 % injection 3 mL  3 mL Intravenous Lillia Mountain, MD   3 mL at 06/18/21 1950  ? sorbitol 70 % solution 30 mL  30 mL Oral PRN Karmen Bongo, MD      ? sulfamethoxazole-trimethoprim (BACTRIM) 400-80 MG per tablet 1 tablet  1 tablet Oral Once per day on Mon Wed Fri Yates, Jennifer, MD   1 tablet at 06/16/21 2355  ? tacrolimus (PROGRAF) capsule 2 mg  2 mg Oral BID Reesa Chew, MD   2 mg at 06/18/21 1949  ? zolpidem (AMBIEN) tablet 5 mg  5 mg Oral QHS PRN Karmen Bongo, MD      ?  ? ? ?Review of Systems: ?10 systems reviewed and negative except per interval history/subjective ? ?Physical Exam: ?Vitals:  ? 06/19/21 1030 06/19/21 1100  ?BP: (!) 176/77 (!) 171/86  ?Pulse: 80 (!) 58  ?Resp: (!) 22 (!) 21  ?Temp:    ?SpO2:    ? ?No intake/output data recorded. ? ?Intake/Output Summary (Last 24 hours) at 06/19/2021 1114 ?Last data filed at 06/18/2021 2100 ?Gross per 24 hour  ?Intake 220 ml  ?Output 500 ml  ?Net -280 ml  ? ?Constitutional: well-appearing, no acute distress ?ENMT: ears and nose without scars or lesions, MMM ?CV: normal rate, 1+ pitting edema in the bilateral lower extremities ?Respiratory: Bilateral chest rise with no increased work of breathing ?Gastrointestinal: soft, non-tender, no palpable masses or hernias ?Skin: no visible lesions or  rashes ?Psych: alert, judgement/insight appropriate, appropriate mood and affect ? ? ?Test Results ?I personally reviewed new and old clinical labs and radiology tests ?Lab Results  ?Component Value Date  ? NA 138 06/19/2021  ? K 3.5 06/19/2021  ? CL 101 06/19/2021  ? CO2 28 06/19/2021  ? BUN 29 (H) 06/19/2021  ? CREATININE 3.46 (H) 06/19/2021  ? CALCIUM 7.8 (L) 06/19/2021  ? ALBUMIN 2.7 (L) 06/19/2021  ? PHOS  3.2 06/19/2021  ? ? ?CBC ?Recent Labs  ?Lab 06/15/21 ?4970 06/16/21 ?0312 06/17/21 ?0416 06/19/21 ?0745  ?WBC 3.6* 1.9* 1.9* 1.9*  ?NEUTROABS 1.7  --   --   --   ?HGB 9.3* 9.4* 8.3* 8.3*  ?HCT 31.2* 29.9* 27.4* 27.8*  ?MCV 91.8 87.7 89.5 90.0  ?PLT 234 212 168 148*  ? ? ? ?

## 2021-06-19 NOTE — Evaluation (Signed)
Physical Therapy Evaluation ?Patient Details ?Name: Karen [name] ?MRN: [id number] ?DOB: Mar 19, 1956 ?Today's Date: 06/19/2021 ? ?History of Present Illness ? The pt is a 66 yo female presenting 3/16 with SOB and SpO2 86% on RA. Pt hypertensive upon arrival (SBP 220), found to have pulmonary edema, large bilateral pleural effusions. Started HD 3/16 for progression to ESRD. PMH includes: anemia, arthritis, seizures, GERD, HLD, HTN, and kidney transplant. ?  ?Clinical Impression ? Pt in bed upon arrival of PT, agreeable to evaluation at this time. Prior to admission the pt was independent, living alone, and still working full time as a Scientist, research (life sciences). The pt reports no falls but recently progressing balance deficits. The pt now presents with limitations in functional mobility, power, endurance, and dynamic stability due to above dx, and will continue to benefit from skilled PT to address these deficits. The pt was able to complete sit-stand without assist, but immediately reached for UE support to steady. She also required minA and single UE support to steady with gait, declined offer for use of RW, but is willing to try a cane in future sessions. Will benefit from skilled PT acutely and after d/c for balance training to maintain independence and reduce risk of falls.  ? ?SBP 177 prior to session. BP after mobility: 164/63 (100) ?   ?   ? ?Recommendations for follow up therapy are one component of a multi-disciplinary discharge planning process, led by the attending physician.  Recommendations may be updated based on patient status, additional functional criteria and insurance authorization. ? ?Follow Up Recommendations Outpatient PT ? ?  ?Assistance Recommended at Discharge Intermittent Supervision/Assistance  ?Patient can return home with the following ? A little help with walking and/or transfers;A little help with bathing/dressing/bathroom ? ?  ?Equipment Recommendations Kasandra Knudsen  ?Recommendations for Other  Services ?    ?  ?Functional Status Assessment Patient has had a recent decline in their functional status and demonstrates the ability to make significant improvements in function in a reasonable and predictable amount of time.  ? ?  ?Precautions / Restrictions Precautions ?Precautions: Fall ?Precaution Comments: BP elevated ?Restrictions ?Weight Bearing Restrictions: No  ? ?  ? ?Mobility ? Bed Mobility ?Overal bed mobility: Independent ?  ?  ?  ?  ?  ?  ?General bed mobility comments: HOB elevated but no need for assist ?  ? ?Transfers ?Overall transfer level: Needs assistance ?Equipment used: None ?Transfers: Sit to/from Stand ?Sit to Stand: Min guard ?  ?  ?  ?  ?  ?General transfer comment: minG for safety, mild LOB with initial stand and pt reaching for single UE support ?  ? ?Ambulation/Gait ?Ambulation/Gait assistance: Min assist ?Gait Distance (Feet): 50 Feet ?Assistive device: None, 1 person hand held assist ?Gait Pattern/deviations: Step-through pattern, Decreased stride length, Staggering left, Staggering right ?Gait velocity: decreased ?  ?  ?General Gait Details: pt with small steps and lateral sway with mild LOB. minA to steady and pt reaching for single UE support. ? ? ?  ? ?Balance Overall balance assessment: Needs assistance ?Sitting-balance support: No upper extremity supported, Feet supported ?Sitting balance-Leahy Scale: Good ?  ?  ?Standing balance support: Single extremity supported, During functional activity ?Standing balance-Leahy Scale: Fair ?Standing balance comment: single UE support with gait ?  ?  ?  ?  ?  ?  ?  ?  ?  ?  ?  ?   ? ? ? ?Pertinent Vitals/Pain Pain Assessment ?Pain Assessment: No/denies pain  ? ? ?  Home Living Family/patient expects to be discharged to:: Private residence ?Living Arrangements: Alone ?Available Help at Discharge: Family ?Type of Home: House ?Home Access: Level entry ?  ?  ?  ?Home Layout: One level ?Home Equipment: None ?   ?  ?Prior Function Prior Level of  Function : Independent/Modified Independent;Working/employed;Driving ?  ?  ?  ?  ?  ?  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  ? ? ?Hand Dominance  ? Dominant Hand: Right ? ?  ?Extremity/Trunk Assessment  ? Upper Extremity Assessment ?Upper Extremity Assessment: Overall WFL for tasks assessed ?  ? ?Lower Extremity Assessment ?Lower Extremity Assessment: Overall WFL for tasks assessed ?  ? ?Cervical / Trunk Assessment ?Cervical / Trunk Assessment: Normal  ?Communication  ? Communication: No difficulties  ?Cognition Arousal/Alertness: Awake/alert ?Behavior During Therapy: [facility] for tasks assessed/performed ?Overall Cognitive Status: Within Functional Limits for tasks assessed ?  ?  ?  ?  ?  ?  ?  ?  ?  ?  ?  ?  ?  ?  ?  ?  ?  ?  ?  ? ?  ?General Comments General comments (skin integrity, edema, etc.): SBP 177 with transition to sitting. BP 164/63 (100) after mobility ? ?  ?   ? ?Assessment/Plan  ?  ?PT Assessment Patient needs continued PT services  ?PT Problem List Decreased strength;Decreased activity tolerance;Decreased range of motion;Decreased balance;Decreased knowledge of use of DME ? ?   ?  ?PT Treatment Interventions DME instruction;Gait training;Stair training;Functional mobility training;Therapeutic activities;Therapeutic exercise;Balance training;Patient/family education   ? ?PT Goals (Current goals can be found in the Care Plan section)  ?Acute Rehab PT Goals ?Patient Stated Goal: return to independence, reduce risk of fall ?PT Goal Formulation: With patient ?Time For Goal Achievement: 07/03/21 ?Potential to Achieve Goals: Good ? ?  ?Frequency Min 3X/week ?  ? ? ?   ?AM-PAC PT "6 Clicks" Mobility  ?Outcome Measure Help needed turning from your back to your side while in a flat bed without using bedrails?: None ?Help needed moving from lying on your back to sitting on the side of a flat bed without  using bedrails?: None ?Help needed moving to and from a bed to a chair (including a wheelchair)?: A Little ?Help needed standing up from a chair using your arms (e.g., wheelchair or bedside chair)?: A Little ?Help needed to walk in hospital room?: A Little ?Help needed climbing 3-5 steps with a railing? : A Little ?6 Click Score: 20 ? ?  ?End of Session Equipment Utilized During Treatment: Gait belt ?Activity Tolerance: Patient tolerated treatment well ?Patient left: in chair;with call bell/phone within reach;with family/visitor present ?Nurse Communication: Mobility status ?PT Visit Diagnosis: Other abnormalities of gait and mobility (R26.89) ?  ? ?Time: 3953-2023 ?PT Time Calculation (min) (ACUTE ONLY): 13 min ? ? ?Charges:   PT Evaluation ?$PT Eval Low Complexity: 1 Low ?  ?  ?   ? ? ?West Carbo, PT, DPT  ? ?Acute Rehabilitation Department ?Pager #: [phone] - [phone] ? ?Sandra Cockayne ?06/19/2021, 5:36 PM ? ?

## 2021-06-19 NOTE — Progress Notes (Signed)
?PROGRESS NOTE ? ? ? ?Karen [name]  MVH:[id number] DOB: 1955-09-01 DOA: 06/15/2021 ?PCP: Edrick Oh, MD  ?Narrative; 65/F with history of ESRD, status post renal transplant in 2005 in 2011, hypertension, progressive CKD4, followed by transplant team at [facility] and Dr. Justin Mend at [facility] was admitted with acute hypoxic respiratory failure secondary to pulmonary edema, hypertensive emergency, creatinine of 5.7, she has an AV fistula  ?-Nephrology consulted, started HD 3/16 ? ? ?Subjective: Feels better overall, taken off oxygen, breathing is improving ? ?Assessment and Plan: ? ?Pulmonary edema ?Acute hypoxic respiratory failure ?Progressive CKD 5, new ESRD ?-History of renal transplant x2, has a functioning AV fistula ?-Required BiPAP on admission, now off, ?-Considered ESRD, started dialysis 3/16, followed by 3/17, 3/18 ?-Appreciate nephrology input, HD today ?-Plan to start CLIP for outpatient HD ?-Increase activity, PT eval ? ?Uncontrolled hypertension ?-Remains poorly controlled, continue amlodipine, labetalol, hydralazine dose increased further today ?-Started on losartan as well by nephrology, monitor potassium ? ?Renal transplant recipient ?-She remains on prednisone, tacrolimus, and mycophenolate  ?-On Bactrim for PCP prophylaxis ?-Tacrolimus and mycophenolate dose lowered ? ?Anemia of chronic disease ?-Iron stores okay, continue EPO with HD ? ?Leukopenia ?-secondary to immunomodulators, wean down slowly, per nephrology ? 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 ?Dyslipidemia ?-Continue Zocor ? ?DVT prophylaxis: Heparin subcutaneous ?Code Status: Full code ?Family Communication: Discussed with patient in detail, no family at bedside ?Disposition Plan: Home pending CLIP for  HD ? ?Consultants:  ?Nephrology ? ?Procedures:  ? ?Antimicrobials:  ? ? ?Objective: ?Vitals:  ? 06/19/21 1030 06/19/21 1100 06/19/21 1130 06/19/21 1200  ?BP: (!) 176/77 (!) 171/86 (!) 172/83 (!) 198/87  ?Pulse: 80 (!) 58 77 76  ?Resp: (!) 22 (!) 21 (!) 22 (!) 21  ?Temp:    97.8 ?F (36.6 ?C)  ?TempSrc:    Temporal  ?SpO2:    95%  ?Weight:    62 kg  ?Height:      ? ? ?Intake/Output Summary (Last 24 hours) at 06/19/2021 1215 ?Last data filed at 06/19/2021 1200 ?Gross per 24 hour  ?Intake 220 ml  ?Output 4500 ml  ?Net -4280 ml  ? ?Filed Weights  ? 06/19/21 0500 06/19/21 0748 06/19/21 1200  ?Weight: 73.5 kg 66.2 kg 62 kg  ? ? ?Examination: ? ?General exam: Pleasant obese female sitting up in bed, AAOx3, no distress ?HEENT: Positive JVD ?CVS: S1-S2, regular rate rhythm ?Lungs: Improved air movement, clear today ?Abdomen: Soft, nontender, bowel sounds present ?Extremities: Trace edema ?Skin: No rashes ?Psychiatry: Judgement and insight appear normal. Mood & affect appropriate.  ? ? ? ?Data Reviewed:  ? ?CBC: ?Recent Labs  ?Lab 06/15/21 ?8413 06/15/21 ?2440 06/16/21 ?1027 06/17/21 ?2536 06/19/21 ?0745  ?WBC  --  3.6* 1.9* 1.9* 1.9*  ?NEUTROABS  --  1.7  --   --   --   ?HGB 10.2* 9.3* 9.4* 8.3* 8.3*  ?HCT 30.0* 31.2* 29.9* 27.4* 27.8*  ?MCV  --  91.8 87.7 89.5 90.0  ?PLT  --  234 212 168 148*  ? ?Basic Metabolic Panel: ?Recent Labs  ?Lab 06/15/21 ?6440 06/16/21 ?3474 06/17/21 ?0416 06/18/21 ?2595 06/19/21 ?0745  ?NA 141 138 140 136 138  ?K 5.2* 4.9 4.4 3.5 3.5  ?CL 104 102 101 98 101  ?CO2 '24 25 27 27 28  '$ ?GLUCOSE 135*  98 85 91 96  ?BUN 92* 56* 37* 25* 29*  ?CREATININE 5.48* 3.95* 3.26* 2.71* 3.46*  ?CALCIUM 7.1* 7.9* 7.9* 7.6* 7.8*  ?PHOS  --   --   --   --  3.2  ? ?GFR: ?Estimated Creatinine Clearance: 13.9 mL/min (A) (by C-G formula based on SCr of 3.46 mg/dL (H)). ?Liver Function Tests: ?Recent Labs  ?Lab 06/15/21 ?0823 06/19/21 ?0745  ?AST 19  --   ?ALT 7  --   ?ALKPHOS 49  --   ?BILITOT 0.7  --   ?PROT 6.0*  --    ?ALBUMIN 3.2* 2.7*  ? ?No results for input(s): LIPASE, AMYLASE in the last 168 hours. ?No results for input(s): AMMONIA in the last 168 hours. ?Coagulation Profile: ?No results for input(s): INR, PROTIME in the last 168 hours. ?Cardiac Enzymes: ?No results for input(s): CKTOTAL, CKMB, CKMBINDEX, TROPONINI in the last 168 hours. ?BNP (last 3 results) ?No results for input(s): PROBNP in the last 8760 hours. ?HbA1C: ?No results for input(s): HGBA1C in the last 72 hours. ?CBG: ?No results for input(s): GLUCAP in the last 168 hours. ?Lipid Profile: ?No results for input(s): CHOL, HDL, LDLCALC, TRIG, CHOLHDL, LDLDIRECT in the last 72 hours. ?Thyroid Function Tests: ?No results for input(s): TSH, T4TOTAL, FREET4, T3FREE, THYROIDAB in the last 72 hours. ?Anemia Panel: ?No results for input(s): VITAMINB12, FOLATE, FERRITIN, TIBC, IRON, RETICCTPCT in the last 72 hours. ? ?Urine analysis: ?   ?Component Value Date/Time  ? [place] YELLOW 09/18/2012 2057  ? APPEARANCEUR CLEAR 09/18/2012 2057  ? LABSPEC 1.021 09/18/2012 2057  ? PHURINE 6.0 09/18/2012 2057  ? GLUCOSEU NEGATIVE 09/18/2012 2057  ? [place] NEGATIVE 09/18/2012 2057  ? [place] NEGATIVE 09/18/2012 2057  ? [place] NEGATIVE 09/18/2012 2057  ? PROTEINUR >300 (A) 09/18/2012 2057  ? UROBILINOGEN 0.2 09/18/2012 2057  ? NITRITE NEGATIVE 09/18/2012 2057  ? LEUKOCYTESUR NEGATIVE 09/18/2012 2057  ? ?Sepsis Labs: ?'@LABRCNTIP'$ (procalcitonin:4,lacticidven:4) ? ?) ?Recent Results (from the past 240 hour(s))  ?Resp Panel by RT-PCR (Flu A&B, Covid) Nasopharyngeal Swab     Status: None  ? Collection Time: 06/15/21  8:00 AM  ? Specimen: Nasopharyngeal Swab; Nasopharyngeal(NP) swabs in vial transport medium  ?Result Value Ref Range Status  ? SARS Coronavirus 2 by RT PCR NEGATIVE NEGATIVE Final  ?  Comment: (NOTE) ?SARS-CoV-2 target nucleic acids are NOT DETECTED. ? ?The SARS-CoV-2 RNA is generally detectable in upper respiratory ?specimens during the acute phase of infection. The  lowest ?concentration of SARS-CoV-2 viral copies this assay can detect is ?138 copies/mL. A negative result does not preclude SARS-Cov-2 ?infection and should not be used as the sole basis for treatment or ?other patient management decisions. A negative result may occur with  ?improper specimen collection/handling, submission of specimen other ?than nasopharyngeal swab, presence of viral mutation(s) within the ?areas targeted by this assay, and inadequate number of viral ?copies(<138 copies/mL). A negative result must be combined with ?clinical observations, patient history, and epidemiological ?information. The expected result is Negative. ? ?Fact Sheet for Patients:  ?EntrepreneurPulse.com.au ? ?Fact Sheet for Healthcare Providers:  ?IncredibleEmployment.be ? ?This test is no t yet approved or cleared by the [place] FDA and  ?has been authorized for detection and/or diagnosis of SARS-CoV-2 by ?FDA under an Emergency Use Authorization (EUA). This EUA will remain  ?in effect (meaning this test can be used) for the duration of the ?COVID-19 declaration under Section 564(b)(1) of the Act, 21 ?U.S.C.section 360bbb-3(b)(1), unless the authorization is terminated  ?or revoked sooner.  ? ? ?  ?  Influenza A by PCR NEGATIVE NEGATIVE Final  ? Influenza B by PCR NEGATIVE NEGATIVE Final  ?  Comment: (NOTE) ?The Xpert Xpress SARS-CoV-2/FLU/RSV plus assay is intended as an aid ?in the diagnosis of influenza from Nasopharyngeal swab specimens and ?should not be used as a sole basis for treatment. Nasal washings and ?aspirates are unacceptable for Xpert Xpress SARS-CoV-2/FLU/RSV ?testing. ? ?Fact Sheet for Patients: ?EntrepreneurPulse.com.au ? ?Fact Sheet for Healthcare Providers: ?IncredibleEmployment.be ? ?This test is not yet approved or cleared by the [place] FDA and ?has been authorized for detection and/or diagnosis of SARS-CoV-2 by ?FDA under  an Emergency Use Authorization (EUA). This EUA will remain ?in effect (meaning this test can be used) for the duration of the ?COVID-19 declaration under Section 564(b)(1) of the Act, 21 U.S.C. ?section 360bbb-3(b)(1), un

## 2021-06-20 DIAGNOSIS — E8770 Fluid overload, unspecified: Secondary | ICD-10-CM | POA: Diagnosis not present

## 2021-06-20 DIAGNOSIS — N289 Disorder of kidney and ureter, unspecified: Secondary | ICD-10-CM | POA: Diagnosis not present

## 2021-06-20 LAB — CBC WITH DIFFERENTIAL/PLATELET
Abs Immature Granulocytes: 0.03 10*3/uL (ref 0.00–0.07)
Basophils Absolute: 0 10*3/uL (ref 0.0–0.1)
Basophils Relative: 1 %
Eosinophils Absolute: 0 10*3/uL (ref 0.0–0.5)
Eosinophils Relative: 2 %
HCT: 26.7 % — ABNORMAL LOW (ref 36.0–46.0)
Hemoglobin: 8.2 g/dL — ABNORMAL LOW (ref 12.0–15.0)
Immature Granulocytes: 2 %
Lymphocytes Relative: 21 %
Lymphs Abs: 0.3 10*3/uL — ABNORMAL LOW (ref 0.7–4.0)
MCH: 27.8 pg (ref 26.0–34.0)
MCHC: 30.7 g/dL (ref 30.0–36.0)
MCV: 90.5 fL (ref 80.0–100.0)
Monocytes Absolute: 0.4 10*3/uL (ref 0.1–1.0)
Monocytes Relative: 22 %
Neutro Abs: 0.9 10*3/uL — ABNORMAL LOW (ref 1.7–7.7)
Neutrophils Relative %: 52 %
Platelets: 123 10*3/uL — ABNORMAL LOW (ref 150–400)
RBC: 2.95 MIL/uL — ABNORMAL LOW (ref 3.87–5.11)
RDW: 14.6 % (ref 11.5–15.5)
WBC: 1.6 10*3/uL — ABNORMAL LOW (ref 4.0–10.5)
nRBC: 0 % (ref 0.0–0.2)

## 2021-06-20 MED ORDER — LOSARTAN POTASSIUM 50 MG PO TABS
100.0000 mg | ORAL_TABLET | Freq: Every day | ORAL | Status: DC
  Administered 2021-06-21: 100 mg via ORAL
  Filled 2021-06-20: qty 2

## 2021-06-20 MED ORDER — DARBEPOETIN ALFA 100 MCG/0.5ML IJ SOSY: 100.0000 ug | PREFILLED_SYRINGE | INTRAMUSCULAR | Status: DC

## 2021-06-20 NOTE — TOC Progression Note (Signed)
Transition of Care (TOC) - Progression Note  ? ? ?Patient Details  ?Name: Karen [name] ?MRN: [id number] ?Date of Birth: May 06, 1955 ? ?Transition of Care (TOC) CM/SW Contact  ?Zenon Mayo, RN ?Phone Number: ?06/20/2021, 2:36 PM ? ?Clinical Narrative:    ?From home, acute resp failure secondary to pulmonary edema, htn emergency, cret elevated, New ESRD, awaiting clip for OP HD, TOC will continue to follow for dc needs.  ? ? ?  ?  ? ?Expected Discharge Plan and Services ?  ?  ?  ?  ?  ?                ?  ?  ?  ?  ?  ?  ?  ?  ?  ?  ? ? ?Social Determinants of Health (SDOH) Interventions ?  ? ?Readmission Risk Interventions ?No flowsheet data found. ? ?

## 2021-06-20 NOTE — Progress Notes (Signed)
Nephrology Follow-Up Consult note ? ? ?Assessment/Recommendations: Karen [name] is a/an 66 y.o. female with a past medical history significant for ESRD s/p renal tx in 2005 and 2011, HTN GERD, and CKD, admitted for volume overload and advancing CKD.    ? ? ?Acute hypoxic respiratory failure: Likely related to volume overload in the setting of renal failure.  Volume status overall improved as well as respiratory status.  Continue volume removal with dialysis ? ?CKD V, now ESRD: Minimal effect from diuresis therefore started on HD.  Continue MWF schedule of dialysis. CLIP in process ?-Monitor Daily I/Os, Daily weight  ?-Maintain MAP>65 for optimal renal perfusion.  ?-Avoid nephrotoxic medications including NSAIDs ?-Use synthetic opioids (Fentanyl/Dilaudid) if needed ? ?H/o Renal Transplant: possibly now ESRD. Given leukopenia decreased meycophenolate to '250mg'$  bid.  Tacrolimus level 3.8.  Lowered Tac to 2 mg twice daily.  Given persistent leukopenia, will stop her mycophenolate (3/21) ? ?Anemia of CKD:  managed with outpt ESA and ESA continued here. S/p ferrlecit '500mg'$ . aranesp 171mg starting on 3/22 ?  ?HTN:  Cont home meds and aggressive diuresis.  will increase her losartan to '100mg'$  daily ?  ?Leukopenia:  chronic, thought immunosuppresion related.  H/o BMBx - unrevealing.  Mycophenolate dose was decreased to 250 mg twice daily.  Stopping mycophenolate 3/21 ?  ?Hyperkalemia:  improved with lokelma/HD  May hold bactrim if wean immunosuppression.  ?  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 Phos acceptable ? ?Karen [name] SCandiss Norse?[facility] ?06/20/2021 ?10:16 AM ? ?___________________________________________________________ ? ?CC: sob ? ?Interval History/Subjective: no acute events. Tolerated HD yesterday with net UF 4L. No complaints today ? ? ?Medications:  ?Current Facility-Administered Medications  ?Medication Dose Route Frequency Provider Last Rate Last Admin  ? acetaminophen (TYLENOL) tablet 650 mg  650 mg  Oral Q6H PRN YKarmen Bongo MD      ? Or  ? acetaminophen (TYLENOL) suppository 650 mg  650 mg Rectal Q6H PRN YKarmen Bongo MD      ? albuterol (PROVENTIL) (2.5 MG/3ML) 0.083% nebulizer solution 2.5 mg  2.5 mg Nebulization Q2H PRN YKarmen Bongo MD   2.5 mg at 06/15/21 1446  ? amLODipine (NORVASC) tablet 10 mg  10 mg Oral Daily YKarmen Bongo MD   10 mg at 06/20/21 08119 ? aspirin EC tablet 81 mg  81 mg Oral Daily YKarmen Bongo MD   81 mg at 06/20/21 01478 ? calcitRIOL (ROCALTROL) capsule 0.25 mcg  0.25 mcg Oral Daily YKarmen Bongo MD   0.25 mcg at 06/20/21 02956 ? calcium carbonate (TUMS - dosed in mg elemental calcium) chewable tablet 500 mg of elemental calcium  500 mg of elemental calcium Oral Q6H PRN YKarmen Bongo MD   500 mg of elemental calcium at 06/15/21 1533  ? camphor-menthol (SARNA) lotion 1 application.  1 application. Topical Q8H PRN YKarmen Bongo MD      ? And  ? hydrOXYzine (ATARAX) tablet 25 mg  25 mg Oral Q8H PRN YKarmen Bongo MD      ? Chlorhexidine Gluconate Cloth 2 % PADS 6 each  6 each Topical Q0600 KJustin Mend MD   6 each at 06/20/21 0410  ? docusate sodium (ENEMEEZ) enema 283 mg  1 enema Rectal PRN YKarmen Bongo MD      ? feeding supplement (NEPRO CARB STEADY) liquid 237 mL  237 mL Oral TID PRN YKarmen Bongo MD      ? heparin injection 5,000 Units  5,000 Units Subcutaneous Q8H YKarmen Bongo MD   5,000 Units  at 06/20/21 0410  ? hydrALAZINE (APRESOLINE) injection 10 mg  10 mg Intravenous Q4H PRN Domenic Polite, MD   10 mg at 06/20/21 8119  ? hydrALAZINE (APRESOLINE) tablet 100 mg  100 mg Oral TID Domenic Polite, MD   100 mg at 06/20/21 1478  ? labetalol (NORMODYNE) tablet 300 mg  300 mg Oral BID Domenic Polite, MD   300 mg at 06/20/21 2956  ? losartan (COZAAR) tablet 50 mg  50 mg Oral Daily Reesa Chew, MD   50 mg at 06/20/21 2130  ? magnesium oxide (MAG-OX) tablet 400 mg  400 mg Oral BID Karmen Bongo, MD   400 mg at 06/19/21 1542  ?  mycophenolate (CELLCEPT) capsule 250 mg  250 mg Oral BID Reesa Chew, MD   250 mg at 06/20/21 8657  ? ondansetron (ZOFRAN) tablet 4 mg  4 mg Oral Q6H PRN Karmen Bongo, MD      ? Or  ? ondansetron [facility] 352) injection 4 mg  4 mg Intravenous Q6H PRN Karmen Bongo, MD   4 mg at 06/19/21 2123  ? pantoprazole (PROTONIX) EC tablet 40 mg  40 mg Oral Daily Karmen Bongo, MD   40 mg at 06/20/21 8469  ? predniSONE (DELTASONE) tablet 5 mg  5 mg Oral Daily Karmen Bongo, MD   5 mg at 06/20/21 6295  ? simvastatin (ZOCOR) tablet 20 mg  20 mg Oral QPM Karmen Bongo, MD   20 mg at 06/19/21 1542  ? sodium chloride flush (NS) 0.9 % injection 3 mL  3 mL Intravenous Q12H Karmen Bongo, MD   3 mL at 06/20/21 0925  ? sorbitol 70 % solution 30 mL  30 mL Oral PRN Karmen Bongo, MD      ? sulfamethoxazole-trimethoprim (BACTRIM) 400-80 MG per tablet 1 tablet  1 tablet Oral Once per day on Mon Wed Fri Yates, Jennifer, MD   1 tablet at 06/19/21 1239  ? tacrolimus (PROGRAF) capsule 2 mg  2 mg Oral BID Reesa Chew, MD   2 mg at 06/20/21 2841  ? zolpidem (AMBIEN) tablet 5 mg  5 mg Oral QHS PRN Karmen Bongo, MD      ?  ? ? ?Review of Systems: ?10 systems reviewed and negative except per interval history/subjective ? ?Physical Exam: ?Vitals:  ? 06/20/21 0500 06/20/21 0731  ?BP: (!) 156/50 (!) 183/63  ?Pulse: 64 77  ?Resp: 14 16  ?Temp:  98.7 ?F (37.1 ?C)  ?SpO2: 98% 95%  ? ?No intake/output data recorded. ? ?Intake/Output Summary (Last 24 hours) at 06/20/2021 1016 ?Last data filed at 06/19/2021 1734 ?Gross per 24 hour  ?Intake 180 ml  ?Output 4000 ml  ?Net -3820 ml  ? ?Constitutional: well-appearing, no acute distress ?ENMT: ears and nose without scars or lesions, MMM ?CV: normal rate, trace pitting edema in the bilateral lower extremities ?Respiratory: Bilateral chest rise with no increased work of breathing ?Gastrointestinal: soft, non-tender, no palpable masses or hernias ?Neuro: awake, alert, speech clear and coherent,  moves all ext spontaneously ?Dialysis access: RUE AVF +b/t ? ? ?Test Results ?I personally reviewed new and old clinical labs and radiology tests ?Lab Results  ?Component Value Date  ? NA 138 06/19/2021  ? K 3.5 06/19/2021  ? CL 101 06/19/2021  ? CO2 28 06/19/2021  ? BUN 29 (H) 06/19/2021  ? CREATININE 3.46 (H) 06/19/2021  ? CALCIUM 7.8 (L) 06/19/2021  ? ALBUMIN 2.7 (L) 06/19/2021  ? PHOS 3.2 06/19/2021  ? ? ?CBC ?Recent Labs  ?  Lab 06/15/21 ?0823 06/16/21 ?0312 06/17/21 ?0416 06/19/21 ?0745 06/20/21 ?0730  ?WBC 3.6*   < > 1.9* 1.9* 1.6*  ?NEUTROABS 1.7  --   --   --  0.9*  ?HGB 9.3*   < > 8.3* 8.3* 8.2*  ?HCT 31.2*   < > 27.4* 27.8* 26.7*  ?MCV 91.8   < > 89.5 90.0 90.5  ?PLT 234   < > 168 148* 123*  ? < > = values in this interval not displayed.  ? ? ? ?

## 2021-06-20 NOTE — Progress Notes (Signed)
?PROGRESS NOTE ? ? ? 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 Justin Mend at [facility] was admitted with acute hypoxic respiratory failure secondary to pulmonary edema, hypertensive emergency, creatinine of 5.7, she has an AV fistula  ?-Nephrology consulted, started HD 3/16, now ESRD ?-Now stable, awaiting clip for outpt HD ? ? ?Subjective: Feels okay, no events overnight, met nephrology social worker yesterday ? ?Assessment and Plan: ? ?Pulmonary edema ?Acute hypoxic respiratory failure ?Progressive CKD 5, new ESRD ?-History of renal transplant x2, has a functioning AV fistula ?-Required BiPAP on admission, now off, ?-Considered ESRD, started dialysis 3/16, followed by 3/17, 3/18, 3/20 ?-Appreciate nephrology input ?-awaiting CLIP for outpatient HD ?-Wean off O2, increase activity ? ?Uncontrolled hypertension ?-Remains poorly controlled, continue amlodipine, labetalol, hydralazine dose increased further today ?-Started on losartan as well by nephrology, monitor potassium ? ?Renal transplant recipient ?-She remains on prednisone, tacrolimus, and mycophenolate  ?-On Bactrim for PCP prophylaxis ?-Tacrolimus and mycophenolate dose lowered, consider discontinuing Bactrim at discharge ? ?Anemia of chronic disease ?-Iron stores okay, continue EPO with HD ? ?Leukopenia ?-secondary to immunomodulators, wean down slowly, per nephrology ? 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 ?Dyslipidemia ?-Continue Zocor ? ?DVT prophylaxis: Heparin subcutaneous ?Code Status: Full code ?Family Communication: Discussed  with patient in detail, no family at bedside ?Disposition Plan: Home pending CLIP for HD ? ?Consultants:  ?Nephrology ? ?Procedures:  ? ?Antimicrobials:  ? ? ?Objective: ?Vitals:  ? 06/20/21 0000 06/20/21 0409 06/20/21 0500 06/20/21 0731  ?BP: (!) 180/60 (!) 193/56 (!) 156/50 (!) 183/63  ?Pulse: 71 70 64 77  ?Resp: '15 13 14 16  ' ?Temp: 98.3 ?F (36.8 ?C) 98.2 ?F (36.8 ?C)  98.7 ?F (37.1 ?C)  ?TempSrc: Oral   Oral  ?SpO2: 96% 93% 98% 95%  ?Weight:  62.3 kg    ?Height:      ? ? ?Intake/Output Summary (Last 24 hours) at 06/20/2021 1057 ?Last data filed at 06/19/2021 1734 ?Gross per 24 hour  ?Intake 180 ml  ?Output 4000 ml  ?Net -3820 ml  ? ?Filed Weights  ? 06/19/21 0748 06/19/21 1200 06/20/21 0409  ?Weight: 66.2 kg 62 kg 62.3 kg  ? ? ?Examination: ? ?General exam: Obese pleasant female sitting up in bed, AAOx3, no distress ?HEENT: No JVD ?CVS: S1-S2, regular rhythm ?Lungs: CTAB ?Abdomen: Soft, nontender, bowel sounds present ?Extremities: Trace edema  ?Skin: No rashes ?Psychiatry: Judgement and insight appear normal. Mood & affect appropriate.  ? ? ? ?Data Reviewed:  ? ?CBC: ?Recent Labs  ?Lab 06/15/21 ?0823 06/16/21 ?0312 06/17/21 ?0416 06/19/21 ?0745 06/20/21 ?0730  ?WBC 3.6* 1.9* 1.9* 1.9* 1.6*  ?NEUTROABS 1.7  --   --   --  0.9*  ?HGB 9.3* 9.4* 8.3* 8.3* 8.2*  ?HCT 31.2* 29.9* 27.4* 27.8* 26.7*  ?MCV 91.8 87.7 89.5 90.0 90.5  ?PLT 234 212 168 148* 123*  ? ?Basic Metabolic Panel: ?Recent Labs  ?Lab 06/15/21 ?4917 06/16/21 ?9150 06/17/21 ?0416 06/18/21 ?5697 06/19/21 ?0745  ?NA 141 138 140 136 138  ?K 5.2* 4.9 4.4 3.5 3.5  ?CL 104 102 101 98 101  ?CO2 '24 25 27 27 28  ' ?  GLUCOSE 135* 98 85 91 96  ?BUN 92* 56* 37* 25* 29*  ?CREATININE 5.48* 3.95* 3.26* 2.71* 3.46*  ?CALCIUM 7.1* 7.9* 7.9* 7.6* 7.8*  ?PHOS  --   --   --   --  3.2  ? ?GFR: ?Estimated Creatinine Clearance: 13.9 mL/min (A) (by C-G formula based on SCr of 3.46 mg/dL (H)). ?Liver Function Tests: ?Recent Labs  ?Lab 06/15/21 ?0823 06/19/21 ?0745  ?AST 19  --   ?ALT  7  --   ?ALKPHOS 49  --   ?BILITOT 0.7  --   ?PROT 6.0*  --   ?ALBUMIN 3.2* 2.7*  ? ?No results for input(s): LIPASE, AMYLASE in the last 168 hours. ?No results for input(s): AMMONIA in the last 168 hours. ?Coagulation Profile: ?No results for input(s): INR, PROTIME in the last 168 hours. ?Cardiac Enzymes: ?No results for input(s): CKTOTAL, CKMB, CKMBINDEX, TROPONINI in the last 168 hours. ?BNP (last 3 results) ?No results for input(s): PROBNP in the last 8760 hours. ?HbA1C: ?No results for input(s): HGBA1C in the last 72 hours. ?CBG: ?No results for input(s): GLUCAP in the last 168 hours. ?Lipid Profile: ?No results for input(s): CHOL, HDL, LDLCALC, TRIG, CHOLHDL, LDLDIRECT in the last 72 hours. ?Thyroid Function Tests: ?No results for input(s): TSH, T4TOTAL, FREET4, T3FREE, THYROIDAB in the last 72 hours. ?Anemia Panel: ?No results for input(s): VITAMINB12, FOLATE, FERRITIN, TIBC, IRON, RETICCTPCT in the last 72 hours. ? ?Urine analysis: ?   ?Component Value Date/Time  ? [place] YELLOW 09/18/2012 2057  ? APPEARANCEUR CLEAR 09/18/2012 2057  ? LABSPEC 1.021 09/18/2012 2057  ? PHURINE 6.0 09/18/2012 2057  ? GLUCOSEU NEGATIVE 09/18/2012 2057  ? [place] NEGATIVE 09/18/2012 2057  ? [place] NEGATIVE 09/18/2012 2057  ? [place] NEGATIVE 09/18/2012 2057  ? PROTEINUR >300 (A) 09/18/2012 2057  ? UROBILINOGEN 0.2 09/18/2012 2057  ? NITRITE NEGATIVE 09/18/2012 2057  ? LEUKOCYTESUR NEGATIVE 09/18/2012 2057  ? ?Sepsis Labs: ?'@LABRCNTIP' (procalcitonin:4,lacticidven:4) ? ?) ?Recent Results (from the past 240 hour(s))  ?Resp Panel by RT-PCR (Flu A&B, Covid) Nasopharyngeal Swab     Status: None  ? Collection Time: 06/15/21  8:00 AM  ? Specimen: Nasopharyngeal Swab; Nasopharyngeal(NP) swabs in vial transport medium  ?Result Value Ref Range Status  ? SARS Coronavirus 2 by RT PCR NEGATIVE NEGATIVE Final  ?  Comment: (NOTE) ?SARS-CoV-2 target nucleic acids are NOT DETECTED. ? ?The SARS-CoV-2 RNA is generally detectable in upper  respiratory ?specimens during the acute phase of infection. The lowest ?concentration of SARS-CoV-2 viral copies this assay can detect is ?138 copies/mL. A negative result does not preclude SARS-Cov-2 ?infection and should not be used as the sole basis for treatment or ?other patient management decisions. A negative result may occur with  ?improper specimen collection/handling, submission of specimen other ?than nasopharyngeal swab, presence of viral mutation(s) within the ?areas targeted by this assay, and inadequate number of viral ?copies(<138 copies/mL). A negative result must be combined with ?clinical observations, patient history, and epidemiological ?information. The expected result is Negative. ? ?Fact Sheet for Patients:  ?EntrepreneurPulse.com.au ? ?Fact Sheet for Healthcare Providers:  ?IncredibleEmployment.be ? ?This test is no t yet approved or cleared by the [place] FDA and  ?has been authorized for detection and/or diagnosis of SARS-CoV-2 by ?FDA under an Emergency Use Authorization (EUA). This EUA will remain  ?in effect (meaning this test can be used) for the duration of the ?COVID-19 declaration under Section 564(b)(1) of the Act, 21 ?U.S.C.section 360bbb-3(b)(1), unless the authorization is terminated  ?or  revoked sooner.  ? ? ?  ? Influenza A by PCR NEGATIVE NEGATIVE Final  ? Influenza B by PCR NEGATIVE NEGATIVE Final  ?  Comment: (NOTE) ?The Xpert Xpress SARS-CoV-2/FLU/RSV plus assay is intended as an aid ?in the diagnosis of influenza from Nasopharyngeal swab specimens and ?should not be used as a sole basis for treatment. Nasal washings and ?aspirates are unacceptable for Xpert Xpress SARS-CoV-2/FLU/RSV ?testing. ? ?Fact Sheet for Patients: ?EntrepreneurPulse.com.au ? ?Fact Sheet for Healthcare Providers: ?IncredibleEmployment.be ? ?This test is not yet approved or cleared by the [place] FDA and ?has been  authorized for detection and/or diagnosis of SARS-CoV-2 by ?FDA under an Emergency Use Authorization (EUA). This EUA will remain ?in effect (meaning this test can be used) for the duration of the ?COVID-19 de

## 2021-06-20 NOTE — Progress Notes (Signed)
Pt has been accepted at [facility] on TTS with 10:55 chair time. Pt can start on Thursday. Pt will need to arrive at 10:15 for first appt to complete paperwork prior to first treatment. After first appt, pt will need to arrive at 10:40. Met with pt at bedside to discuss out-pt arrangements. Pt agreeable to plan. Arrangements added to pt's AVS as well. Update provided to attending, nephrologist, RN, and RN CM. Will assist as needed.  ? ?Melven Sartorius ?Renal Navigator ?[phone] ?

## 2021-06-20 NOTE — Plan of Care (Signed)

## 2021-06-20 NOTE — TOC Initial Note (Addendum)
Transition of Care (TOC) - Initial/Assessment Note  ? ? ?Patient Details  ?Name: Karen [name] ?MRN: [id number] ?Date of Birth: 08/08/1955 ? ?Transition of Care (TOC) CM/SW Contact:    ?Zenon Mayo, RN ?Phone Number: ?06/20/2021, 3:34 PM ? ?Clinical Narrative:                 ?NCM spoke with patient at the bedside, she still works, she still drives. She is indep.  NCM asked if she would like to be set up for outpatient physical therapy she states yes , she would like to go to the one on [address] by the hospital.  She would also like to get a single point cane, she is ok with [employer] supplying this for her.  NCM made referral to [facility] with [employer] for the cane, and made referral thru epic for the outpatient physical therapy for patient, located on AV[place]. She will have transport at dc. ? ?Expected Discharge Plan: Home/Self Care ?Barriers to Discharge: Continued Medical Work up ? ? ?Patient Goals and CMS Choice ?Patient states their goals for this hospitalization and ongoing recovery are:: return home ?  ?Choice offered to / list presented to : NA ? ?Expected Discharge Plan and Services ?Expected Discharge Plan: Home/Self Care ?In-house Referral: NA ?Discharge Planning Services: CM Consult ?  ?  ?                ?DME Arranged: Cane ?DME Agency: [employer] ?Date DME Agency Contacted: 06/20/21 ?Time DME Agency Contacted: [id number] ?Representative spoke with at DME Agency: Maudie Mercury ?HH Arranged: NA ?  ?  ?  ?  ? ?Prior Living Arrangements/Services ?  ?Lives with:: Self ?Patient language and need for interpreter reviewed:: Yes ?Do you feel safe going back to the place where you live?: Yes      ?Need for Family Participation in Patient Care: No (Comment) ?Care giver support system in place?: No (comment) ?  ?Criminal Activity/Legal Involvement Pertinent to Current Situation/Hospitalization: No - Comment as needed ? ?Activities of Daily Living ?Home Assistive Devices/Equipment: None ?ADL Screening (condition at time of  admission) ?Patient's cognitive ability adequate to safely complete daily activities?: Yes ?Is the patient deaf or have difficulty hearing?: No ?Does the patient have difficulty seeing, even when wearing glasses/contacts?: Yes ?Does the patient have difficulty concentrating, remembering, or making decisions?: No ?Patient able to express need for assistance with ADLs?: Yes ?Does the patient have difficulty dressing or bathing?: Yes ?Independently performs ADLs?: Yes (appropriate for developmental age) ?Communication: Needs assistance ?Is this a change from baseline?: Change from baseline, expected to last <3 days ?Dressing (OT): Needs assistance ?Is this a change from baseline?: Change from baseline, expected to last <3days ?Grooming: Needs assistance ?Feeding: Needs assistance ?Is this a change from baseline?: Change from baseline, expected to last <3 days ?Bathing: Needs assistance ?Is this a change from baseline?: Change from baseline, expected to last <3 days ?Toileting: Needs assistance ?Is this a change from baseline?: Change from baseline, expected to last <3 days ?In/Out Bed: Needs assistance ?Is this a change from baseline?: Change from baseline, expected to last <3 days ?Walks in Home: Independent, Needs assistance ?Is this a change from baseline?: Change from baseline, expected to last <3 days ?Does the patient have difficulty walking or climbing stairs?: Yes ?Weakness of Legs: Both ?Weakness of Arms/Hands: None ? ?Permission Sought/Granted ?  ?  ?   ?   ?   ?   ? ?Emotional Assessment ?Appearance:: Appears stated age ?Attitude/Demeanor/Rapport:  Engaged ?Affect (typically observed): Appropriate ?Orientation: : Oriented to Self, Oriented to Place, Oriented to Situation ?Alcohol / Substance Use: Not Applicable ?Psych Involvement: No (comment) ? ?Admission diagnosis:  Hyperkalemia [E87.5] ?Respiratory acidosis [E87.29] ?Pericardial effusion [I31.39] ?Acute pulmonary edema (HCC) [J81.0] ?Bilateral pleural  effusion [J90] ?Anemia, chronic disease [D63.8] ?Acute respiratory failure with hypoxia ([place]) [J96.01] ?AKI (acute kidney injury) ([place]) [N17.9] ?History of renal transplant [Z94.0] ?Chronic renal failure, unspecified CKD stage [N18.9] ?Hypervolemia associated with renal insufficiency [E87.70, N28.9] ?Patient Active Problem List  ? Diagnosis Date Noted  ? Hypervolemia associated with renal insufficiency 06/15/2021  ? Hypertensive crisis 06/15/2021  ? Dyslipidemia 06/15/2021  ? Renal transplant recipient 08/13/2015  ? 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 ?PCP:  Edrick Oh, MD ?Pharmacy:   ?[id number]?Phone: [phone]: [phone]? ? ? ? ?Social Determinants of Health (SDOH) Interventions ?  ? ?Readmission Risk Interventions ?Readmission Risk Prevention Plan 06/20/2021  ?Transportation Screening Complete  ?PCP or Specialist Appt within 3-5 Days Complete  ?[facility] [facility] Care Consult Complete  ?Social Work Consult for [place]/Counseling Complete  ?Palliative Care Screening Not Applicable  ?Medication Review (Press photographer Complete  ?Some recent data might be hidden  ? ? ? ?

## 2021-06-21 ENCOUNTER — Other Ambulatory Visit (HOSPITAL_COMMUNITY): Payer: Self-pay

## 2021-06-21 ENCOUNTER — Encounter (HOSPITAL_COMMUNITY): Payer: Self-pay

## 2021-06-21 DIAGNOSIS — I169 Hypertensive crisis, unspecified: Secondary | ICD-10-CM | POA: Diagnosis not present

## 2021-06-21 DIAGNOSIS — Z940 Kidney transplant status: Secondary | ICD-10-CM

## 2021-06-21 DIAGNOSIS — N179 Acute kidney failure, unspecified: Secondary | ICD-10-CM | POA: Diagnosis not present

## 2021-06-21 DIAGNOSIS — R569 Unspecified convulsions: Secondary | ICD-10-CM | POA: Diagnosis not present

## 2021-06-21 DIAGNOSIS — E785 Hyperlipidemia, unspecified: Secondary | ICD-10-CM | POA: Diagnosis not present

## 2021-06-21 DIAGNOSIS — N189 Chronic kidney disease, unspecified: Secondary | ICD-10-CM

## 2021-06-21 LAB — BASIC METABOLIC PANEL
Anion gap: 8 (ref 5–15)
BUN: 28 mg/dL — ABNORMAL HIGH (ref 8–23)
CO2: 28 mmol/L (ref 22–32)
Calcium: 8.1 mg/dL — ABNORMAL LOW (ref 8.9–10.3)
Chloride: 101 mmol/L (ref 98–111)
Creatinine, Ser: 3.66 mg/dL — ABNORMAL HIGH (ref 0.44–1.00)
GFR, Estimated: 13 mL/min — ABNORMAL LOW (ref >60)
Glucose, Bld: 93 mg/dL (ref 70–99)
Potassium: 3.8 mmol/L (ref 3.5–5.1)
Sodium: 137 mmol/L (ref 135–145)

## 2021-06-21 LAB — CBC
HCT: 25.8 % — ABNORMAL LOW (ref 36.0–46.0)
Hemoglobin: 7.8 g/dL — ABNORMAL LOW (ref 12.0–15.0)
MCH: 27.3 pg (ref 26.0–34.0)
MCHC: 30.2 g/dL (ref 30.0–36.0)
MCV: 90.2 fL (ref 80.0–100.0)
Platelets: 153 10*3/uL (ref 150–400)
RBC: 2.86 MIL/uL — ABNORMAL LOW (ref 3.87–5.11)
RDW: 14.9 % (ref 11.5–15.5)
WBC: 1.7 10*3/uL — ABNORMAL LOW (ref 4.0–10.5)
nRBC: 0 % (ref 0.0–0.2)

## 2021-06-21 MED ORDER — HYDRALAZINE HCL 100 MG PO TABS
100.0000 mg | ORAL_TABLET | Freq: Three times a day (TID) | ORAL | 0 refills | Status: DC
  Filled 2021-06-21: qty 90, 30d supply, fill #0

## 2021-06-21 MED ORDER — LOSARTAN POTASSIUM 100 MG PO TABS
100.0000 mg | ORAL_TABLET | Freq: Every day | ORAL | 0 refills | Status: DC
  Filled 2021-06-21: qty 30, 30d supply, fill #0

## 2021-06-21 MED ORDER — LABETALOL HCL 300 MG PO TABS
300.0000 mg | ORAL_TABLET | Freq: Two times a day (BID) | ORAL | 0 refills | Status: DC
Start: 2021-06-21 — End: 2021-08-07
  Filled 2021-06-21: qty 60, 30d supply, fill #0

## 2021-06-21 MED ORDER — TACROLIMUS 1 MG PO CAPS
2.0000 mg | ORAL_CAPSULE | Freq: Two times a day (BID) | ORAL | Status: DC
Start: 2021-06-21 — End: 2023-03-12

## 2021-06-21 MED ORDER — NEPRO/CARBSTEADY PO LIQD
237.0000 mL | Freq: Three times a day (TID) | ORAL | 0 refills | Status: AC | PRN
  Filled 2021-06-21: qty 1000, 2d supply, fill #0

## 2021-06-21 MED ORDER — VENTOLIN HFA 108 (90 BASE) MCG/ACT IN AERS: 1.0000 | INHALATION_SPRAY | Freq: Two times a day (BID) | RESPIRATORY_TRACT | Status: AC | PRN

## 2021-06-21 MED ORDER — DARBEPOETIN ALFA 100 MCG/0.5ML IJ SOSY: 100.0000 ug | PREFILLED_SYRINGE | INTRAMUSCULAR | Status: DC

## 2021-06-21 NOTE — Progress Notes (Signed)
Pt to d/c to home today. Spoke to pt via phone. Pt confirms she understand her clinic schedule and reminded pt that she needs to be at clinic tomorrow for first treatment. Pt voices understanding. HD appt details on pt's AVS. Contacted clinic to make them aware pt will start tomorrow. Contacted renal NP regarding clinic's need for orders.  ? ?Melven Sartorius ?Renal Navigator ?[phone] ?

## 2021-06-21 NOTE — Discharge Summary (Signed)
Physician Discharge Summary   Patient: Karen [name] MRN: [id number] DOB: 1955/07/30  Admit date:     06/15/2021  Discharge date: 06/21/21  Discharge Physician: York Ram Odessie Polzin   PCP: Elvis Coil, MD   Recommendations at discharge:    Patient will continue with outpatient renal replacement therapy. Decreased tacrolimus to 1 mg bid and discontinue with mycophenolate (06/20/21) due to medication induced leukopenia.  Stopped bactrim on 06/21/21 to prevent hyperkalemia.   Discharge Diagnoses: Principal Problem:   Hypervolemia associated with renal insufficiency Active Problems:   Hypertensive crisis   Renal transplant recipient   Convulsions/seizures [facility])   Dyslipidemia  Resolved Problems:   * No resolved hospital problems. [facility] Course: Karen [name] was admitted to the hospital with the working diagnosis of worsening renal function, now progressed to  ESRD.   66 yo female with the past medical history of renal transplant, seizures, hypertension and dyslipidemia who presented with dyspnea. Reported rapid progression of edema and dyspnea, that prompted her to come to the hospital. On her initial physical examination her systolic blood pressure was 220 mmHg, HR 75, RR 18, she was in respiratory distress and was placed on bipap, oxygen saturation 98%. Her lungs had diffuse wheezing, and increase work of breathing, heart with S1 and S2 present and rhythmic, abdomen soft and positive lower extremity edema +++.   VBG pH 7.24 with Pc02 54,  Na 139, K 5.2 CL 108, bicarbonate 24. Glucose 128, bun 99, cr 5.48  BNP 1,860 High sensitive troponin 23  Wbc 3.4. hgb 9,3 hct 31,2 and plt 234.  Sars covid 19 negative   Chest radiograph with cardiomegaly, bilateral hilar vascular congestion, bilateral interstitial infiltrates at the lower lobes with bilateral small pleural effusions.   EKG 92 bpm, normal axis, normal intervals, sinus rhythm, with no significant ST segment or T  wave changes.   Nephrology was consulted and patient underwent urgent hemodialysis with ultrafiltration with improvement of her symptoms.  She was wean off non invasive mechanical ventilation to nasal cannula and then to room air with good toleration.   Outpatient hemodialysis unit was arranged and she will continue renal replacement therapy as outpatient.     Assessment and Plan: * Hypervolemia associated with renal insufficiency -Patient with prior h/o renal transplant, has not been back on HD but has had progressive CKD -Now with frank pulmonary edema and volume overload, in need of acute HD -This may be precipitated by HTN Crisis, by ESRD, or both -Regardless, she needs diuresis (given Lasix 120 mg by nephrology without marked improvement), BP control (placed on NTG drip initially but then given home meds with attempt to wean for HD), and HD at least transiently -She is likely to benefit from serial HD both today and tomorrow  -Nephrology is consulting -Nephrology prn order set was used  Hypertensive crisis -Patient presenting with severe HTN and SOB concerning for hypertensive crisis -She was started on NTG drip and this is being weaned to off so that she can go to HD -Resume home BP meds including amlodipine, labetalol, hydralazine -Will add prn IV hydralazine -Anticipate further improvement with HD  Renal transplant recipient -She remains on prednisone, tacrolimus, and mycophenolate  -Unfortunately, her trajectory indicates that she will need urgent HD and possibly resumption of long term HD -Fistula remains in place  Convulsions/seizures (HCC) -Remote h/o seizures associated with PRES and then had a prolonged seizure-free period off medications -Recurrent ? Seizure a year ago with negative EEG -She is not  currently taking medications for this issue  Dyslipidemia -Continue Zocor         Consultants: nephrology  Procedures performed: none   Disposition: Home Diet  recommendation:  Discharge Diet Orders (From admission, onward)     Start     Ordered   06/21/21 0000  Diet - low sodium heart healthy        06/21/21 1346           Cardiac diet DISCHARGE MEDICATION: Allergies as of 06/21/2021   No Known Allergies      Medication List     STOP taking these medications    furosemide 40 MG tablet Commonly known as: LASIX   mycophenolate 250 MG capsule Commonly known as: CELLCEPT   sodium bicarbonate 650 MG tablet   sulfamethoxazole-trimethoprim 400-80 MG tablet Commonly known as: BACTRIM       TAKE these medications    amLODipine 10 MG tablet Commonly known as: NORVASC Take 10 mg by mouth daily.   aspirin EC 81 MG tablet Take 81 mg by mouth daily.   azithromycin 250 MG tablet Commonly known as: ZITHROMAX Take 250 mg by mouth as directed. Started on 06-13-21 x 5 days   calcitRIOL 0.25 MCG capsule Commonly known as: ROCALTROL Take 0.25 mcg by mouth daily. Mon,Wed Fri   feeding supplement (NEPRO CARB STEADY) Liqd Take 237 mLs by mouth 3 (three) times daily as needed (Supplement).   hydrALAZINE 100 MG tablet Commonly known as: APRESOLINE Take 1 tablet (100 mg total) by mouth 3 (three) times daily. What changed:  medication strength how much to take when to take this   labetalol 300 MG tablet Commonly known as: NORMODYNE Take 1 tablet (300 mg total) by mouth 2 (two) times daily. What changed:  medication strength how much to take   losartan 100 MG tablet Commonly known as: COZAAR Take 1 tablet (100 mg total) by mouth daily. Start taking on: June 22, 2021   magnesium oxide 400 MG tablet Commonly known as: MAG-OX Take 400 mg by mouth 2 (two) times daily.   omeprazole 20 MG capsule Commonly known as: PRILOSEC Take 20 mg by mouth daily.   predniSONE 5 MG tablet Commonly known as: DELTASONE Take 5 mg by mouth daily.   simvastatin 20 MG tablet Commonly known as: ZOCOR Take 20 mg by mouth every evening.    tacrolimus 1 MG capsule Commonly known as: PROGRAF Take 2 capsules (2 mg total) by mouth 2 (two) times daily. What changed: how much to take   Ventolin HFA 108 (90 Base) MCG/ACT inhaler Generic drug: albuterol Inhale 1 puff into the lungs 3 times/day as needed-between meals & bedtime. What changed: how much to take               Durable Medical Equipment  (From admission, onward)           Start     Ordered   06/20/21 1533  For home use only DME Cane  Once        06/20/21 1532            Follow-up Information     [facility] Follow up.   Specialty: Rehabilitation Why: for outpatient physical therapy, please call them to set up your apt times. Contact information: [address] [id number] mc [place] [address] [phone]        [facility]. Go on 06/22/2021.   Why: Schedule is Tuesday/Thursday/Saturday with 10:55 chair time.  Patient to start on Thursday, March 23. Patient needs to arrive at 10:15 to complete paperwork. Contact information: [address] [place] [address] [phone]         [facility] Follow up.   Why: single point cane Contact information: [address] [place] [address] [phone]                Discharge Exam: Filed Weights   06/19/21 1200 06/20/21 0409 06/21/21 0356  Weight: 62 kg 62.3 kg 64.2 kg   BP (!) 197/63 (BP Location: Left Arm)   Pulse 68   Temp 98.6 F (37 C) (Oral)   Resp 16   Ht 5\' 1"  (1.549 m)   Wt 64.2 kg   SpO2 96%   BMI 26.74 kg/m   Neurology awake and alert ENT with pallor Cardiovascular S1 and S2 present and rhythmic with no gallops or rubs. No JVD No lower extremity edema Respiratory with no rales or wheezing Abdomen soft   Condition at discharge: stable  The results of significant diagnostics from this hospitalization (including imaging, microbiology, ancillary and  laboratory) are listed below for reference.   Imaging Studies: US Renal Transplant w/Doppler  Result Date: 06/15/2021 CLINICAL DATA:  Acute kidney injury EXAM: ULTRASOUND OF RENAL TRANSPLANT WITH RENAL DOPPLER ULTRASOUND TECHNIQUE: Ultrasound examination of the renal transplant was performed with gray-scale, color and duplex doppler evaluation. COMPARISON:  None. FINDINGS: Transplant kidney location: RLQ Transplant Kidney: Renal measurements: 10.9 x 6.3 x 5.1 cm = volume: . Normal in size. Diffusely echogenic renal parenchyma. Mild hydronephrosis. Color flow in the main renal artery:  Yes Color flow in the main renal vein:  Yes Duplex Doppler Evaluation: Main Renal Artery Velocity: 217 cm/second cm/sec Main Renal Artery Resistive Index: 0.91 Venous waveform in main renal vein:  Present Intrarenal resistive index in upper pole:  0.92 (normal 0.6-0.8; equivocal 0.8-0.9; abnormal >= 0.9) Intrarenal resistive index in lower pole: 0.87 (normal 0.6-0.8; equivocal 0.8-0.9; abnormal >= 0.9) Bladder: Not visualized Other findings:  Small volume ascites. IMPRESSION: 1. Abnormal exam. 2. Elevated resistive indices greater than 0.9. Differential considerations include ATN, transplant rejection and drug toxicity. 3. Very mild hydronephrosis. 4. Diffusely echogenic renal parenchyma. Electronically Signed   By: Malachy Moan M.D.   On: 06/15/2021 14:35   DG Chest Portable 1 View  Result Date: 06/15/2021 CLINICAL DATA:  Shortness of breath EXAM: PORTABLE CHEST 1 VIEW COMPARISON:  11/29/2006 FINDINGS: Gross cardiomegaly. Layering bilateral pleural effusions and associated atelectasis or consolidation. Osseous structures are unremarkable. IMPRESSION: Gross cardiomegaly. Layering bilateral pleural effusions and associated atelectasis or consolidation. Electronically Signed   By: Jearld Lesch M.D.   On: 06/15/2021 08:32   ECHOCARDIOGRAM COMPLETE  Result Date: 06/16/2021    ECHOCARDIOGRAM REPORT   Patient Name:    Karen [name] Date of Exam: 06/16/2021 Medical Rec #:  [id number]       Height:       61.0 in Accession #:    [id number]      Weight:       163.1 lb Date of Birth:  [birth year]      BSA:          1.732 m Patient Age:    65 years        BP:           150/80 mmHg Patient Gender: F               HR:  83 bpm. Exam Location:  Inpatient Procedure: 2D Echo Indications:    Dyspnea  History:        Patient has prior history of Echocardiogram examinations, most                 recent 11/29/2006.  Sonographer:    Eduard Roux Referring Phys: [id number] PREETHA JOSEPH IMPRESSIONS  1. Left ventricular ejection fraction, by estimation, is 60 to 65%. The left ventricle has normal function. The left ventricle has no regional wall motion abnormalities. There is mild left ventricular hypertrophy. Left ventricular diastolic parameters are consistent with Grade II diastolic dysfunction (pseudonormalization).  2. Right ventricular systolic function is normal. The right ventricular size is normal. There is severely elevated pulmonary artery systolic pressure. The estimated right ventricular systolic pressure is 65.4 mmHg.  3. Left atrial size was severely dilated.  4. Right atrial size was mildly dilated.  5. The mitral valve is abnormal. Trivial mitral valve regurgitation. Mean MV gradient 9 mmHg with MVA 1.79 cm^2 by VTI. However, the valve appears to open well visually, ?elevated gradient with high flow in HD patient. Overall, suspect mild to moderate mitral stenosis. Moderate mitral annular calcification.  6. The aortic valve is tricuspid. There is moderate calcification of the aortic valve. Aortic valve regurgitation is not visualized. Aortic valve sclerosis/calcification is present, without any evidence of aortic stenosis.  7. The inferior vena cava is normal in size with greater than 50% respiratory variability, suggesting right atrial pressure of 3 mmHg.  8. A small pericardial effusion is present. The pericardial effusion  is circumferential. There is no evidence of cardiac tamponade. FINDINGS  Left Ventricle: Left ventricular ejection fraction, by estimation, is 60 to 65%. The left ventricle has normal function. The left ventricle has no regional wall motion abnormalities. The left ventricular internal cavity size was normal in size. There is  mild left ventricular hypertrophy. Left ventricular diastolic parameters are consistent with Grade II diastolic dysfunction (pseudonormalization). Right Ventricle: The right ventricular size is normal. No increase in right ventricular wall thickness. Right ventricular systolic function is normal. There is severely elevated pulmonary artery systolic pressure. The tricuspid regurgitant velocity is 3.95 m/s, and with an assumed right atrial pressure of 3 mmHg, the estimated right ventricular systolic pressure is 65.4 mmHg. Left Atrium: Left atrial size was severely dilated. Right Atrium: Right atrial size was mildly dilated. Pericardium: A small pericardial effusion is present. The pericardial effusion is circumferential. There is no evidence of cardiac tamponade. Mitral Valve: The mitral valve is abnormal. There is mild calcification of the mitral valve leaflet(s). Moderate mitral annular calcification. Trivial mitral valve regurgitation. 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 MV peak gradient, 17.1 mmHg. The mean mitral valve gradient is 9.0 mmHg. Tricuspid Valve: The tricuspid valve is normal in structure. Tricuspid valve regurgitation is mild. Aortic Valve: The aortic valve is tricuspid. There is moderate calcification of the aortic valve. Aortic valve regurgitation is not visualized. Aortic valve sclerosis/calcification is present, without any evidence of aortic stenosis. Aortic valve peak gradient measures 17.4 mmHg. Pulmonic Valve: The pulmonic valve was normal in structure. Pulmonic valve regurgitation is trivial. Aorta: The aortic root is normal in size and structure. Venous: The  inferior vena cava is normal in size with greater than 50% respiratory variability, suggesting right atrial pressure of 3 mmHg. IAS/Shunts: No atrial level shunt detected by color flow Doppler.  LEFT VENTRICLE PLAX 2D LVIDd:         4.80 cm   Diastology LVIDs:  2.50 cm   LV e' medial:    5.85 cm/s LV PW:         1.20 cm   LV E/e' medial:  30.1 LV IVS:        1.20 cm   LV e' lateral:   6.64 cm/s LVOT diam:     2.00 cm   LV E/e' lateral: 26.5 LV SV:         112 LV SV Index:   65 LVOT Area:     3.14 cm  RIGHT VENTRICLE             IVC RV Basal diam:  3.40 cm     IVC diam: 2.20 cm RV Mid diam:    3.70 cm RV S prime:     13.60 cm/s TAPSE (M-mode): 2.7 cm LEFT ATRIUM              Index        RIGHT ATRIUM           Index LA diam:        4.80 cm  2.77 cm/m   RA Area:     16.40 cm LA Vol (A2C):   104.0 ml 60.04 ml/m  RA Volume:   43.80 ml  25.29 ml/m LA Vol (A4C):   102.0 ml 58.89 ml/m LA Biplane Vol: 110.0 ml 63.51 ml/m  AORTIC VALVE                  PULMONIC VALVE AV Area (Vmax): 2.82 cm      PV Vmax:       1.32 m/s AV Vmax:        208.50 cm/s   PV Peak grad:  7.0 mmHg AV Peak Grad:   17.4 mmHg LVOT Vmax:      187.00 cm/s LVOT Vmean:     114.000 cm/s LVOT VTI:       0.356 m  AORTA Ao Root diam: 2.70 cm Ao Asc diam:  2.60 cm MITRAL VALVE                TRICUSPID VALVE MV Area (PHT): 4.57 cm     TR Peak grad:   62.4 mmHg MV Area VTI:   1.79 cm     TR Vmax:        395.00 cm/s MV Peak grad:  17.1 mmHg MV Mean grad:  9.0 mmHg     SHUNTS MV Vmax:       2.07 m/s     Systemic VTI:  0.36 m MV Vmean:      137.0 cm/s   Systemic Diam: 2.00 cm MV Decel Time: 166 msec MV E velocity: 176.00 cm/s MV A velocity: 161.00 cm/s MV E/A ratio:  1.09 Dalton McleanMD Electronically signed by Wilfred Lacy Signature Date/Time: 06/16/2021/4:39:51 PM    Final     Microbiology: Results for orders placed or performed during the hospital encounter of 06/15/21  Resp Panel by RT-PCR (Flu A&B, Covid) Nasopharyngeal Swab     Status:  None   Collection Time: 06/15/21  8:00 AM   Specimen: Nasopharyngeal Swab; Nasopharyngeal(NP) swabs in vial transport medium  Result Value Ref Range Status   SARS Coronavirus 2 by RT PCR NEGATIVE NEGATIVE Final    Comment: (NOTE) SARS-CoV-2 target nucleic acids are NOT DETECTED.  The SARS-CoV-2 RNA is generally detectable in upper respiratory specimens during the acute phase of infection. The lowest concentration of SARS-CoV-2 viral copies this assay can detect is 138 copies/mL.  A negative result does not preclude SARS-Cov-2 infection and should not be used as the sole basis for treatment or other patient management decisions. A negative result may occur with  improper specimen collection/handling, submission of specimen other than nasopharyngeal swab, presence of viral mutation(s) within the areas targeted by this assay, and inadequate number of viral copies(<138 copies/mL). A negative result must be combined with clinical observations, patient history, and epidemiological information. The expected result is Negative.  Fact Sheet for Patients:  BloggerCourse.com  Fact Sheet for Healthcare Providers:  SeriousBroker.it  This test is no t yet approved or cleared by the [place] FDA and  has been authorized for detection and/or diagnosis of SARS-CoV-2 by FDA under an Emergency Use Authorization (EUA). This EUA will remain  in effect (meaning this test can be used) for the duration of the COVID-19 declaration under Section 564(b)(1) of the Act, 21 U.S.C.section 360bbb-3(b)(1), unless the authorization is terminated  or revoked sooner.       Influenza A by PCR NEGATIVE NEGATIVE Final   Influenza B by PCR NEGATIVE NEGATIVE Final    Comment: (NOTE) The Xpert Xpress SARS-CoV-2/FLU/RSV plus assay is intended as an aid in the diagnosis of influenza from Nasopharyngeal swab specimens and should not be used as a sole basis for  treatment. Nasal washings and aspirates are unacceptable for Xpert Xpress SARS-CoV-2/FLU/RSV testing.  Fact Sheet for Patients: BloggerCourse.com  Fact Sheet for Healthcare Providers: SeriousBroker.it  This test is not yet approved or cleared by the [place] FDA and has been authorized for detection and/or diagnosis of SARS-CoV-2 by FDA under an Emergency Use Authorization (EUA). This EUA will remain in effect (meaning this test can be used) for the duration of the COVID-19 declaration under Section 564(b)(1) of the Act, 21 U.S.C. section 360bbb-3(b)(1), unless the authorization is terminated or revoked.  Performed at [facility], [address], [place], [place] [address]     Labs: CBC: Recent Labs  Lab 06/15/21 [phone] 06/16/21 0312 06/17/21 0416 06/19/21 0745 06/20/21 0730 06/21/21 0335  WBC 3.6* 1.9* 1.9* 1.9* 1.6* 1.7*  NEUTROABS 1.7  --   --   --  0.9*  --   HGB 9.3* 9.4* 8.3* 8.3* 8.2* 7.8*  HCT 31.2* 29.9* 27.4* 27.8* 26.7* 25.8*  MCV 91.8 87.7 89.5 90.0 90.5 90.2  PLT 234 212 168 148* 123* 153   Basic Metabolic Panel: Recent Labs  Lab 06/16/21 0312 06/17/21 0416 06/18/21 0332 06/19/21 0745 06/21/21 0335  NA 138 140 136 138 137  K 4.9 4.4 3.5 3.5 3.8  CL 102 101 98 101 101  CO2 25 27 27 28 28   GLUCOSE 98 85 91 96 93  BUN 56* 37* 25* 29* 28*  CREATININE 3.95* 3.26* 2.71* 3.46* 3.66*  CALCIUM 7.9* 7.9* 7.6* 7.8* 8.1*  PHOS  --   --   --  3.2  --    Liver Function Tests: Recent Labs  Lab 06/15/21 0823 06/19/21 0745  AST 19  --   ALT 7  --   ALKPHOS 49  --   BILITOT 0.7  --   PROT 6.0*  --   ALBUMIN 3.2* 2.7*   CBG: No results for input(s): GLUCAP in the last 168 hours.  Discharge time spent: greater than 30 minutes.  Signed: Coralie Keens, MD [facility] 06/21/2021

## 2021-06-21 NOTE — TOC Transition Note (Signed)
Transition of Care (TOC) - CM/SW Discharge Note ? ? ?Patient Details  ?Name: Jodeen Mclin ?MRN: [id number] ?Date of Birth: Jun 12, 1955 ? ?Transition of Care (TOC) CM/SW Contact:  ?Zenon Mayo, RN ?Phone Number: ?06/21/2021, 12:49 PM ? ? ?Clinical Narrative:    ?Patient for dc home today, she is set up with outpatient physical therapy located on AVS. Cane supplied by [employer]. She has transport to go home. She has no other needs.  ? ? ?Final next level of care: Home/Self Care ?Barriers to Discharge: Continued Medical Work up ? ? ?Patient Goals and CMS Choice ?Patient states their goals for this hospitalization and ongoing recovery are:: return home ?  ?Choice offered to / list presented to : NA ? ?Discharge Placement ?  ?           ?  ?  ?  ?  ? ?Discharge Plan and Services ?In-house Referral: NA ?Discharge Planning Services: CM Consult ?           ?DME Arranged: Cane ?DME Agency: [employer] ?Date DME Agency Contacted: 06/20/21 ?Time DME Agency Contacted: [phone] ?Representative spoke with at DME Agency: Maudie Mercury ?HH Arranged: NA ?  ?  ?  ?  ? ?Social Determinants of Health (SDOH) Interventions ?  ? ? ?Readmission Risk Interventions ? ?  06/20/2021  ?  3:30 PM  ?Readmission Risk Prevention Plan  ?Transportation Screening Complete  ?PCP or Specialist Appt within 3-5 Days Complete  ?[place] or Home Care Consult Complete  ?Social Work Consult for [place] Planning/Counseling Complete  ?Palliative Care Screening Not Applicable  ?Medication Review Press photographer) Complete  ? ? ? ? ? ?

## 2021-06-21 NOTE — Progress Notes (Signed)
Nephrology Follow-Up Consult note ? ? ?Assessment/Recommendations: Karen [name] is a/an 66 y.o. female with a past medical history significant for ESRD s/p renal tx in 2005 and 2011, HTN GERD, and CKD, admitted for volume overload and advancing CKD.    ? ? ?Acute hypoxic respiratory failure: Likely related to volume overload in the setting of renal failure.  Volume status overall improved as well as respiratory status.  Continue volume removal with dialysis ? ?CKD V, now ESRD: Minimal effect from diuresis therefore started on HD.  -accepted to [place] [facility] schedule therefore will maintain TTS while she's here. Next hd tomorrow if she's still here. ?-Monitor Daily I/Os, Daily weight  ?-Maintain MAP>65 for optimal renal perfusion.  ?-Avoid nephrotoxic medications including NSAIDs ?-Use synthetic opioids (Fentanyl/Dilaudid) if needed ? ?H/o Renal Transplant: possibly now ESRD. Given leukopenia decreased meycophenolate to '250mg'$  bid.  Tacrolimus level 3.8.  Lowered Tac to 2 mg twice daily.  Given persistent leukopenia, stopped her mycophenolate (3/21) and her bactrim on 3/22 ? ?Anemia of CKD:  managed with outpt ESA and ESA continued here. S/p ferrlecit '500mg'$ . aranesp 166mg starting on 3/23 ?  ?HTN:  Cont home meds and aggressive UF.  inc'd losartan to '100mg'$  daily-first dose today. BP uncontrolled. Next step is to consider low dose coreg (HR in the 60-70s) if she remains uncontrolled ?  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  H/o BMBx - unrevealing.  Mycophenolate dose was decreased to 250 mg twice daily.  Stopped mycophenolate altogether 3/21. Stopped bactrim 3/22 ?  ?Hyperkalemia:  improved with lokelma/HD  May hold bactrim if wean immunosuppression.  ?  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 Phos acceptable ? ?[name] SCandiss Norse?[name] ?06/21/2021 ?10:43 AM ? ?___________________________________________________________ ? ?CC: sob ? ?Interval History/Subjective: no acute events.  [facility] therefore no [facility] today. No complaints. Requesting a note as to when she can return to work ? ? ?Medications:  ?Current Facility-Administered Medications  ?Medication Dose Route Frequency Provider Last Rate Last Admin  ? acetaminophen (TYLENOL) tablet 650 mg  650 mg Oral Q6H PRN YKarmen Bongo MD      ? Or  ? acetaminophen (TYLENOL) suppository 650 mg  650 mg Rectal Q6H PRN YKarmen Bongo MD      ? albuterol (PROVENTIL) (2.5 MG/3ML) 0.083% nebulizer solution 2.5 mg  2.5 mg Nebulization Q2H PRN YKarmen Bongo MD   2.5 mg at 06/15/21 1446  ? amLODipine (NORVASC) tablet 10 mg  10 mg Oral Daily YKarmen Bongo MD   10 mg at 06/21/21 09509 ? aspirin EC tablet 81 mg  81 mg Oral Daily YKarmen Bongo MD   81 mg at 06/21/21 03267 ? calcitRIOL (ROCALTROL) capsule 0.25 mcg  0.25 mcg Oral Daily YKarmen Bongo MD   0.25 mcg at 06/21/21 01245 ? calcium carbonate (TUMS - dosed in mg elemental calcium) chewable tablet 500 mg of elemental calcium  500 mg of elemental calcium Oral Q6H PRN YKarmen Bongo MD   500 mg of elemental calcium at 06/15/21 1533  ? camphor-menthol (SARNA) lotion 1 application.  1 application. Topical Q8H PRN YKarmen Bongo MD      ? And  ? hydrOXYzine (ATARAX) tablet 25 mg  25 mg Oral Q8H PRN YKarmen Bongo MD      ? Chlorhexidine Gluconate Cloth 2 % PADS 6 each  6 each Topical Q0600 KJustin Mend MD   6 each at 06/21/21 0413  ? Darbepoetin Alfa (ARANESP) injection 100 mcg  100 mcg Intravenous  Q Wed-HD Gean Quint, MD      ? docusate sodium [facility]) enema 283 mg  1 enema Rectal PRN Karmen Bongo, MD      ? feeding supplement (NEPRO CARB STEADY) liquid 237 mL  237 mL Oral TID PRN Karmen Bongo, MD      ? heparin injection 5,000 Units  5,000 Units Subcutaneous Camelia Phenes Karmen Bongo, MD   5,000 Units at 06/21/21 0413  ? hydrALAZINE (APRESOLINE) injection 10 mg  10 mg Intravenous Q4H PRN Domenic Polite, MD   10 mg at 06/21/21 0410  ? hydrALAZINE (APRESOLINE)  tablet 100 mg  100 mg Oral TID Domenic Polite, MD   100 mg at 06/21/21 1761  ? labetalol (NORMODYNE) tablet 300 mg  300 mg Oral BID Domenic Polite, MD   300 mg at 06/21/21 1000  ? losartan (COZAAR) tablet 100 mg  100 mg Oral Daily Gean Quint, MD   100 mg at 06/21/21 6073  ? magnesium oxide (MAG-OX) tablet 400 mg  400 mg Oral BID Karmen Bongo, MD   400 mg at 06/20/21 1607  ? ondansetron (ZOFRAN) tablet 4 mg  4 mg Oral Q6H PRN Karmen Bongo, MD      ? Or  ? ondansetron [facility]) injection 4 mg  4 mg Intravenous Q6H PRN Karmen Bongo, MD   4 mg at 06/19/21 2123  ? pantoprazole (PROTONIX) EC tablet 40 mg  40 mg Oral Daily Karmen Bongo, MD   40 mg at 06/21/21 1000  ? predniSONE (DELTASONE) tablet 5 mg  5 mg Oral Daily Karmen Bongo, MD   5 mg at 06/21/21 7106  ? simvastatin (ZOCOR) tablet 20 mg  20 mg Oral QPM Karmen Bongo, MD   20 mg at 06/20/21 1712  ? sodium chloride flush (NS) 0.9 % injection 3 mL  3 mL Intravenous Lillia Mountain, MD   3 mL at 06/20/21 2013  ? sorbitol 70 % solution 30 mL  30 mL Oral PRN Karmen Bongo, MD      ? tacrolimus (PROGRAF) capsule 2 mg  2 mg Oral BID Reesa Chew, MD   2 mg at 06/21/21 2694  ? zolpidem (AMBIEN) tablet 5 mg  5 mg Oral QHS PRN Karmen Bongo, MD      ?  ? ? ?Review of Systems: ?10 systems reviewed and negative except per interval history/subjective ? ?Physical Exam: ?Vitals:  ? 06/21/21 0400 06/21/21 0731  ?BP:  (!) 217/69  ?Pulse:  72  ?Resp: (!) 25 19  ?Temp:  98.2 ?F (36.8 ?C)  ?SpO2:  93%  ? ?Total I/O ?In: 118 [P.O.:118] ?Out: -  ? ?Intake/Output Summary (Last 24 hours) at 06/21/2021 1043 ?Last data filed at 06/21/2021 0850 ?Gross per 24 hour  ?Intake 238 ml  ?Output 150 ml  ?Net 88 ml  ? ?Constitutional: well-appearing, no acute distress ?ENMT: ears and nose without scars or lesions, MMM ?CV: normal rate, trace pitting edema in the bilateral lower extremities ?Respiratory: Bilateral chest rise with no increased work of  breathing ?Gastrointestinal: soft, non-tender, no palpable masses or hernias ?Neuro: awake, alert, speech clear and coherent, moves all ext spontaneously ?Dialysis access: RUE AVF +b/t ? ? ?Test Results ?I personally reviewed new and old clinical labs and radiology tests ?Lab Results  ?Component Value Date  ? NA 137 06/21/2021  ? K 3.8 06/21/2021  ? CL 101 06/21/2021  ? CO2 28 06/21/2021  ? BUN 28 (H) 06/21/2021  ? CREATININE 3.66 (H) 06/21/2021  ? CALCIUM 8.1 (  L) 06/21/2021  ? ALBUMIN 2.7 (L) 06/19/2021  ? PHOS 3.2 06/19/2021  ? ? ?CBC ?Recent Labs  ?Lab 06/15/21 ?0823 06/16/21 ?0312 06/19/21 ?0745 06/20/21 ?0730 06/21/21 ?0335  ?WBC 3.6*   < > 1.9* 1.6* 1.7*  ?NEUTROABS 1.7  --   --  0.9*  --   ?HGB 9.3*   < > 8.3* 8.2* 7.8*  ?HCT 31.2*   < > 27.8* 26.7* 25.8*  ?MCV 91.8   < > 90.0 90.5 90.2  ?PLT 234   < > 148* 123* 153  ? < > = values in this interval not displayed.  ? ? ? ?

## 2021-06-21 NOTE — Progress Notes (Signed)
Physical Therapy Treatment ?Patient Details ?Name: Karen [name] ?MRN: [id number] ?DOB: 13-Aug-1955 ?Today's Date: 06/21/2021 ? ? ?History of Present Illness 66 yo female presenting 3/16 with SOB and SpO2 86% on RA. Pt hypertensive upon arrival (SBP 220), found to have pulmonary edema, large bilateral pleural effusions. Started HD 3/16 for progression to ESRD. PMH includes: anemia, arthritis, seizures, GERD, HLD, HTN, and kidney transplant. ? ?  ?PT Comments  ? ? Pt was seen for therapy to get up and walk, then to monitor her vitals with exertion.  Pt is controlled for BP today at the time of session, and O2 sats were no lower than 90% with gait.  Pt is having controlled measures of HR, and will recommend she follow up with outpatient for lateral balance skills and endurance changes.  Follow along for acute PT goals of therapy.   ?Recommendations for follow up therapy are one component of a multi-disciplinary discharge planning process, led by the attending physician.  Recommendations may be updated based on patient status, additional functional criteria and insurance authorization. ? ?Follow Up Recommendations ? Outpatient PT ?  ?  ?Assistance Recommended at Discharge Intermittent Supervision/Assistance  ?Patient can return home with the following A little help with walking and/or transfers;A little help with bathing/dressing/bathroom ?  ?Equipment Recommendations ? Cane  ?  ?Recommendations for Other Services   ? ? ?  ?Precautions / Restrictions Precautions ?Precautions: Fall ?Precaution Comments: BP elevated ?Restrictions ?Weight Bearing Restrictions: No  ?  ? ?Mobility ? Bed Mobility ?  ?  ?  ?  ?  ?  ?  ?General bed mobility comments: up in chair when PT arrived ?  ? ?Transfers ?Overall transfer level: Needs assistance ?Equipment used: None ?Transfers: Sit to/from Stand ?Sit to Stand: Min guard ?  ?  ?  ?  ?  ?General transfer comment: with min guard is in control of balance ?   ? ?Ambulation/Gait ?Ambulation/Gait assistance: Min guard, Min assist ?Gait Distance (Feet): 110 Feet ?Assistive device: None, 1 person hand held assist ?Gait Pattern/deviations: Step-through pattern, Decreased stride length, Staggering left, Staggering right ?Gait velocity: decreased ?Gait velocity interpretation: <1.31 ft/sec, indicative of household ambulator ?Pre-gait activities: standing balance evaluation ?General Gait Details: pt was supported on RUE due to tendency to list and require lateral control ? ? ?Stairs ?  ?  ?  ?  ?  ? ? ?Wheelchair Mobility ?  ? ?Modified Rankin (Stroke Patients Only) ?  ? ? ?  ?Balance Overall balance assessment: Needs assistance ?Sitting-balance support: Feet supported ?Sitting balance-Leahy Scale: Good ?  ?  ?Standing balance support: Single extremity supported, During functional activity ?Standing balance-Leahy Scale: Fair ?Standing balance comment: single UE to support lateral instability ?  ?  ?  ?  ?  ?  ?  ?  ?  ?  ?  ?  ? ?  ?Cognition Arousal/Alertness: Awake/alert ?Behavior During Therapy: [facility] for tasks assessed/performed ?Overall Cognitive Status: Within Functional Limits for tasks assessed ?  ?  ?  ?  ?  ?  ?  ?  ?  ?  ?  ?  ?  ?  ?  ?  ?  ?  ?  ? ?  ?Exercises   ? ?  ?General Comments General comments (skin integrity, edema, etc.): BP is controlled today, 162/52 sitting ?  ?  ? ?Pertinent Vitals/Pain Pain Assessment ?Pain Assessment: No/denies pain  ? ? ?Home Living   ?  ?  ?  ?  ?  ?  ?  ?  ?  ?   ?  ?  Prior Function    ?  ?  ?   ? ?PT Goals (current goals can now be found in the care plan section) Acute Rehab PT Goals ?Patient Stated Goal: get home and independent ?Progress towards PT goals: Progressing toward goals ? ?  ?Frequency ? ? ? Min 3X/week ? ? ? ?  ?PT Plan Current plan remains appropriate  ? ? ?Co-evaluation   ?  ?  ?  ?  ? ?  ?AM-PAC PT "6 Clicks" Mobility   ?Outcome Measure ? Help needed turning from your back to your side while in a flat bed without  using bedrails?: None ?Help needed moving from lying on your back to sitting on the side of a flat bed without using bedrails?: None ?Help needed moving to and from a bed to a chair (including a wheelchair)?: A Little ?Help needed standing up from a chair using your arms (e.g., wheelchair or bedside chair)?: A Little ?Help needed to walk in hospital room?: A Little ?Help needed climbing 3-5 steps with a railing? : A Little ?6 Click Score: 20 ? ?  ?End of Session Equipment Utilized During Treatment: Gait belt ?Activity Tolerance: Patient tolerated treatment well;Patient limited by fatigue ?Patient left: in chair;with call bell/phone within reach;with chair alarm set ?Nurse Communication: Mobility status ?PT Visit Diagnosis: Other abnormalities of gait and mobility (R26.89) ?  ? ? ?Time: 9741-6384 ?PT Time Calculation (min) (ACUTE ONLY): 26 min ? ?Charges:  $Gait Training: 8-22 mins ?$Therapeutic Activity: 8-22 mins ?Ramond Dial ?06/21/2021, 5:19 PM ? ?Mee Hives, PT PhD ?Acute Rehab Dept. Number: [facility] [phone] and [place] [phone] ? ? ?

## 2021-06-21 NOTE — Hospital Course (Addendum)
Karen [name] was admitted to the hospital with the working diagnosis of worsening renal function, now progressed to  ?ESRD.  ? ?66 yo female with the past medical history of renal transplant, complicated with progressive renal disease, CKD stage V, seizures, hypertension and dyslipidemia who presented with dyspnea. Reported rapid progression of edema and dyspnea, that prompted her to come to the hospital. On her initial physical examination her systolic blood pressure was 220 mmHg, HR 75, RR 18, she was in respiratory distress and was placed on bipap, oxygen saturation 98%. Her lungs had diffuse wheezing, and increase work of breathing, heart with S1 and S2 present and rhythmic, abdomen soft and positive lower extremity edema +++.  ? ?VBG pH 7.24 with Pc02 54,  ?Na 139, K 5.2 CL 108, bicarbonate 24. Glucose 128, bun 99, cr 5.48  ?BNP 1,860 ?High sensitive troponin 23  ?Wbc 3.4. hgb 9,3 hct 31,2 and plt 234.  ?[facility] covid 19 negative  ? ?Chest radiograph with cardiomegaly, bilateral hilar vascular congestion, bilateral interstitial infiltrates at the lower lobes with bilateral small pleural effusions.  ? ?EKG 92 bpm, normal axis, normal intervals, sinus rhythm, with no significant ST segment or T wave changes.  ? ?Nephrology was consulted and patient underwent urgent hemodialysis with ultrafiltration with improvement of her symptoms.  ?She was wean off non invasive mechanical ventilation to nasal cannula and then to room air with good toleration.  ? ?Outpatient hemodialysis unit was arranged and she will continue renal replacement therapy as outpatient.  ? ? ?

## 2021-06-22 ENCOUNTER — Telehealth: Payer: Self-pay | Admitting: Nurse Practitioner

## 2021-06-22 ENCOUNTER — Encounter (HOSPITAL_COMMUNITY): Payer: BC Managed Care – PPO

## 2021-06-22 NOTE — Telephone Encounter (Signed)
Transition of care contact from inpatient facility ? ?Date of Discharge: 06/21/2021 ?Date of Contact: 06/22/2021 ?Method of contact: Phone ? ?Attempted to contact patient to discuss transition of care from inpatient admission. Patient did not answer the phone. Message was left on the patient's voicemail with call back number [phone]. ? ?

## 2021-06-26 ENCOUNTER — Ambulatory Visit: Payer: BC Managed Care – PPO | Admitting: Family Medicine

## 2021-06-29 ENCOUNTER — Encounter (HOSPITAL_COMMUNITY): Payer: BC Managed Care – PPO

## 2021-08-04 ENCOUNTER — Other Ambulatory Visit: Payer: Self-pay

## 2021-08-04 ENCOUNTER — Encounter: Payer: Self-pay | Admitting: Vascular Surgery

## 2021-08-04 ENCOUNTER — Ambulatory Visit (INDEPENDENT_AMBULATORY_CARE_PROVIDER_SITE_OTHER): Payer: BC Managed Care – PPO | Admitting: Vascular Surgery

## 2021-08-04 DIAGNOSIS — N186 End stage renal disease: Secondary | ICD-10-CM | POA: Diagnosis not present

## 2021-08-04 DIAGNOSIS — Z992 Dependence on renal dialysis: Secondary | ICD-10-CM | POA: Insufficient documentation

## 2021-08-04 NOTE — Progress Notes (Signed)
? ? ?Patient name: Karen [name] MRN: [id number] DOB: 30-Jul-1955 Sex: female ? 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 ?HPI: ?Karen [name] is a 66 y.o. 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  Patient states this was placed around 2008 here in our practice by Dr. Kellie Simmering.  She had a functioning kidney transplant up until about a month ago.  She has gone back to using her right arm AV fistula and has had several bleeding events.  She also states a small scab/ulcer formed where they have been sticking it. ? ?Past Medical History:  ?Diagnosis Date  ? Anemia associated with chronic renal failure   ? Arthritis   ? Convulsions/seizures ([place]) 01/28/2013  ? Dyslipidemia   ? GERD (gastroesophageal reflux disease)   ? Hyperlipidemia   ? Hypertension   ? Kidney transplanted   ? Seizure disorder ([place])   ? ? ?Past Surgical History:  ?Procedure Laterality Date  ? CATHETER REMOVAL    ? COLONOSCOPY    ? GALLBLADDER SURGERY    ? KIDNEY TRANSPLANT    ? 04/10/2009  ? ? ?Family History  ?Problem Relation Age of Onset  ? Stroke Mother   ? Seizures Mother   ? Migraines Mother   ? Diabetes Brother   ? Colon cancer Neg Hx   ? Esophageal cancer Neg Hx   ? Stomach cancer Neg Hx   ? Rectal cancer Neg Hx   ? ? ?SOCIAL HISTORY: ?Social History  ? ?Socioeconomic History  ? Marital status: Single  ?  Spouse name: Not on file  ? Number of children: 1  ? Years of education: 51  ? Highest education level: Not on file  ?Occupational History  ? Occupation: CNA  ?Tobacco Use  ? Smoking status: Former  ?  Types: Cigarettes  ? Smokeless tobacco: Never  ? Tobacco comments:  ?  quit smoking 25 yrs ago  ?Substance and Sexual Activity  ? Alcohol use: No  ?  Comment: quit 25 years ago  ? Drug use: No  ? Sexual activity: Not on file  ?Other Topics Concern  ? Not on  file  ?Social History Narrative  ? Right handed  ? No caffeine  ? One story home  ? ?Social Determinants of Health  ? ?Financial Resource Strain: Not on file  ?Food Insecurity: Not on file  ?Transportation Needs: Not on file  ?Physical Activity: Not on file  ?Stress: Not on file  ?Social Connections: Not on file  ?Intimate Partner Violence: Not on file  ? ? ?No Known Allergies ? ?Current Outpatient Medications  ?Medication Sig Dispense Refill  ? amLODipine (NORVASC) 10 MG tablet Take 10 mg by mouth daily.    ? aspirin EC 81 MG tablet Take 81 mg by mouth daily.    ? calcitRIOL (ROCALTROL) 0.25 MCG capsule Take 0.25 mcg by mouth daily. Mon,Wed Fri    ? magnesium oxide (MAG-OX) 400 MG tablet Take 400 mg by mouth 2 (two) times daily.     ? omeprazole (PRILOSEC) 20 MG capsule Take 20 mg by mouth daily.    ? predniSONE (DELTASONE) 5 MG tablet Take 5 mg by mouth daily.    ? simvastatin (ZOCOR) 20 MG tablet Take 20 mg by mouth every evening.    ? tacrolimus (PROGRAF) 1 MG capsule Take 2 capsules (2 mg total) by  mouth 2 (two) times daily.    ? VENTOLIN HFA 108 (90 Base) MCG/ACT inhaler Inhale 1 puff into the lungs 3 times/day as needed-between meals & bedtime.    ? azithromycin (ZITHROMAX) 250 MG tablet Take 250 mg by mouth as directed. Started on 06-13-21 x 5 days (Patient not taking: Reported on 08/04/2021)    ? hydrALAZINE (APRESOLINE) 100 MG tablet Take 1 tablet (100 mg total) by mouth 3 (three) times daily. 90 tablet 0  ? labetalol (NORMODYNE) 300 MG tablet Take 1 tablet (300 mg total) by mouth 2 (two) times daily. 60 tablet 0  ? losartan (COZAAR) 100 MG tablet Take 1 tablet (100 mg total) by mouth daily. 30 tablet 0  ? ?No current facility-administered medications for this visit.  ? ? ?REVIEW OF SYSTEMS:  ?'[X]'$  denotes positive finding, '[ ]'$  denotes negative finding ?Cardiac  Comments:  ?Chest pain or chest pressure:    ?Shortness of breath upon exertion:    ?Short of breath when lying flat:    ?Irregular heart rhythm:     ?    ?Vascular    ?Pain in calf, thigh, or hip brought on by ambulation:    ?Pain in feet at night that wakes you up from your sleep:     ?Blood clot in your veins:    ?Leg swelling:     ?    ?Pulmonary    ?Oxygen at home:    ?Productive cough:     ?Wheezing:     ?    ?Neurologic    ?Sudden weakness in arms or legs:     ?Sudden numbness in arms or legs:     ?Sudden onset of difficulty speaking or slurred speech:    ?Temporary loss of vision in one eye:     ?Problems with dizziness:     ?    ?Gastrointestinal    ?Blood in stool:     ?Vomited blood:     ?    ?Genitourinary    ?Burning when urinating:     ?Blood in urine:    ?    ?Psychiatric    ?Major depression:     ?    ?Hematologic    ?Bleeding problems:    ?Problems with blood clotting too easily:    ?    ?Skin    ?Rashes or ulcers:    ?    ?Constitutional    ?Fever or chills:    ? ? ?PHYSICAL EXAM: ?Vitals:  ? 08/04/21 1120 08/04/21 1124  ?BP: (!) 195/80 (!) 178/60  ?Pulse: 73 75  ?Resp: 14   ?Temp: 97.9 ?F (36.6 ?C)   ?TempSrc: Temporal   ?SpO2: 94%   ?Weight: 118 lb (53.5 kg)   ?Height: 5' (1.524 m)   ? ? ?GENERAL: The patient is a well-nourished female, in no acute distress. The vital signs are documented above. ?CARDIAC: There is a regular rate and rhythm.  ?VASCULAR:  ?Right brachiocephalic AV fistula with good thrill ?PULMONARY: No respiratory distress. ?ABDOMEN: Soft and non-tender. ?MUSCULOSKELETAL: There are no major deformities or cyanosis. ?NEUROLOGIC: No focal weakness or paresthesias are detected. ?PSYCHIATRIC: The patient has a normal affect. ? ? ? ? ?DATA:  ? ?N/A ? ?Assessment/Plan: ? 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  I discussed in detail with her that just because she has an aneurysmal fistula does not mean  this requires repair.  That being said she does have a small ulcer that is  forming and has had bleeding events.  I have recommended revision in the OR with excision of the aneurysm with primary repair versus plication with a TDC placement to allow this to heal.  I discussed we will take up to 1 month to heal.  I have offered to do this next week and she wants Friday.  Discussed this being done as an outpatient in the OR at [facility].  Risks benefits discussed. ? ? ?Marty Heck, MD ?[facility] ?Office: [phone] ? ? ? ? ?

## 2021-08-10 ENCOUNTER — Other Ambulatory Visit: Payer: Self-pay

## 2021-08-10 ENCOUNTER — Encounter (HOSPITAL_COMMUNITY): Payer: Self-pay | Admitting: Vascular Surgery

## 2021-08-10 NOTE — Progress Notes (Signed)
Anesthesia Chart Review: SAME DAY WORK-UP ? Case: [id number] Date/Time: 08/11/21 0715  ? Procedures:  ?    RIGHT ARM ARTERIOVENOUS FISTULA REVISION (Right) ?    INSERTION OF TUNNELED DIALYSIS CATHETER  ? Anesthesia type: Choice  ? Pre-op diagnosis: ESRD  ? Location: [facility] OR ROOM 16 / [facility] OR  ? Surgeons: Marty Heck, MD  ? ?  ? ? ?DISCUSSION: Patient is a 66 year old female scheduled for the above procedure. ? 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 06/16/21 echo done during hypervolemia/hypertensive urgency/failed DDKT progressing to ESRD requiring urgent HD 05/2021 admission showed normal LVEF, no regional wall motion abnormalities,  grade II DD, severely elevated PASP, estimated RVSP 65.4 mmHg, severely dilated LA, mildly dilated RA, trivial MR, suspected mild-moderate MS ("Mean MV gradient 9 mmHg with MVA 1.79 cm^2 by VTI. However, the valve appears  ?to open well visually, ?elevated gradient with high flow in HD patient"), small pericardial effusion without evidence of tamponade.  ? ?[employer] admission 06/15/21-06/21/21 for hypervolemia associated with acute on chronic renal failure/failed 2nd DDKT with progression to ESRD. SBP 220.  She underwent urgent hemodialysis via functioning RUE AVF. She was then weaned off noninvasive mechanical ventilation to nasal cannula and ultimately to room air.  Outpatient hemodialysis arranged. Of note, 06/16/21 echo done during admission showed normal LVEF, no regional wall motion abnormalities,  grade II DD, severely elevated PASP, estimated RVSP 65.4 mmHg, severely dilated LA, mildly dilated RA, trivial MR, suspected mild-moderate MS ("Mean MV gradient 9 mmHg with MVA 1.79 cm^2 by VTI. However, the valve appears   ?to open well visually, ?elevated gradient with high flow in HD patient"), small pericardial effusion without evidence of tamponade.  ? ?Seen by Dr. 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 He recommended revision in the OR with excision of the aneurysm with primary repair versus plication with a TDC placement to allow this to heal. She undergoes HD TTS. ? ?Anesthesia team to evaluate on the day of surgery.  She reported retaining more fluid lately, but says she will go to dialysis on 08/10/2021.  ? ? ?VS:  ?BP Readings from Last 3 Encounters:  ?08/04/21 (!) 178/60  ?06/21/21 (!) 197/63  ?05/25/21 (!) 161/64  ? ?Pulse Readings from Last 3 Encounters:  ?08/04/21 75  ?06/21/21 68  ?05/25/21 73  ?  ? ?PROVIDERS: ?- Lorene Dy, MD is PCP ? ?- Edrick Oh, MD is nephrologist. Also followed at the [facility], last visit 05/30/21 by Viviana Simpler, PA-C.  Creatinine had progressively increased since 2021 along with protein urea.  07/07/2020 biopsy showed no rejection with chronic changes.  ? ?- Harish, Vallathucherry, MD is HEM-ONC ([facility]).  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 Notes states, "Since the patient has remained persistently neutropenic with absolute neutrophil count is decreasing to 500, despite discontinuation of Bactrim therapy, I obtained a diagnostic bone marrow biopsy on 08/09/2020. The final pathology showed a normocellular marrow for age with erythroid predominance and no increased blasts. There was no evidence of dysplasia. Flow cytometry did not reveal any evidence of increased blasts or aberrant T or B cells. Chromosome analysis and FISH studies were normal. Hence, I felt that Ms.  Cates had immune mediated neutropenia versus drug-induced neutropenia. Since the absolute neutrophil count has fluctuated between 500 and normal without any recurrent infections,  she has not been started on white cell growth factor support." ? ? ?LABS: For day of surgery as indicated. H/H 7.8/25.8 on 06/21/21. ?  ? ?EEG 09/05/20: ?Impression: ?This 1-hour awake and asleep EEG is normal.   ?Clinical Correlation: ?A normal EEG does not exclude a clinical diagnosis of epilepsy.  If further clinical questions remain, prolonged EEG may be helpful.  Clinical correlation is advised. ? ? ?IMAGES: ?US Renal Transplant 06/15/21: ?IMPRESSION: ?1. Abnormal exam. ?2. Elevated resistive indices greater than 0.9. Differential ?considerations include ATN, transplant rejection and drug toxicity. ?3. Very mild hydronephrosis. ?4. Diffusely echogenic renal parenchyma. ?  ?1V PCXR 06/15/21: ?FINDINGS: ?Gross cardiomegaly. Layering bilateral pleural effusions and ?associated atelectasis or consolidation. Osseous structures are ?unremarkable. ?IMPRESSION: ?Gross cardiomegaly. Layering bilateral pleural effusions and ?associated atelectasis or consolidation. ? ? ?EKG: 06/15/21: ?Sinus rhythm ?Probable left atrial enlargement ?Consider anterior infarct ?Baseline wander in lead(s) V5 ?Confirmed by Elnora Morrison [phone]) on 06/15/2021 8:01:48 AM ? ? ?CV: ?Echo 06/16/21 (in setting of hypertensive urgency/hypervolemia/acute renal failure/failed DDKT requiring urgent hemodialysis):  ?IMPRESSIONS  ? 1. Left ventricular ejection fraction, by estimation, is 60 to 65%. The  ?left ventricle has normal function. The left ventricle has no regional  ?wall motion abnormalities. There is mild left ventricular hypertrophy.  ?Left ventricular diastolic parameters  ?are consistent with Grade II diastolic dysfunction (pseudonormalization).  ? 2. Right ventricular systolic function is normal. The right ventricular  ?size is normal. There is severely elevated pulmonary artery systolic  ?pressure. The estimated right ventricular systolic pressure is 38.2 mmHg.  ? 3. Left atrial size was severely dilated.  ? 4. Right atrial size was mildly  dilated.  ? 5. The mitral valve is abnormal. Trivial mitral valve regurgitation. Mean  ?MV gradient 9 mmHg with MVA 1.79 cm^2 by VTI. However, the valve appears  ?to open well visually, ?elevated gradient with high flow in HD patient.  ?Overall, suspect mild to moderate  ?mitral stenosis. Moderate mitral annular calcification.  ? 6. The aortic valve is tricuspid. There is moderate calcification of the  ?aortic valve. Aortic valve regurgitation is not visualized. Aortic valve  ?sclerosis/calcification is present, without any evidence of aortic  ?stenosis.  ? 7. The inferior vena cava is normal in size with greater than 50%  ?respiratory variability, suggesting right atrial pressure of 3 mmHg.  ? 8. A small pericardial effusion is present. The pericardial effusion is  ?circumferential. There is no evidence of cardiac tamponade.  ? ?She had a cardiac cath ~ 2003 prior to her first renal transplant. ? ?Past Medical History:  ?Diagnosis Date  ? Anemia associated with chronic renal failure   ? Arthritis   ? Convulsions/seizures ([place]) 01/28/2013  ? Dyslipidemia   ? GERD (gastroesophageal reflux disease)   ? Hyperlipidemia   ? Hypertension   ? Kidney transplanted   ? Seizure disorder ([place])   ? Stroke [facility])   ? "possible mini stroke" years ago per patient- patient no longer sees neurologist  ? ? ?Past Surgical History:  ?Procedure Laterality Date  ? CATHETER REMOVAL    ? COLONOSCOPY    ? GALLBLADDER SURGERY    ? KIDNEY TRANSPLANT    ? 04/10/2009  ? ? ?MEDICATIONS: ?No current facility-administered medications for this encounter.  ? ? amLODipine (NORVASC) 10 MG tablet  ? aspirin EC 81 MG tablet  ?  calcitRIOL (ROCALTROL) 0.25 MCG capsule  ? hydrALAZINE (APRESOLINE) 100 MG tablet  ? labetalol (NORMODYNE) 300 MG tablet  ? losartan (COZAAR) 100 MG tablet  ? magnesium oxide (MAG-OX) 400 MG tablet  ? omeprazole (PRILOSEC) 20 MG capsule  ? predniSONE (DELTASONE) 5 MG tablet  ? simvastatin (ZOCOR) 20 MG tablet  ? tacrolimus (PROGRAF)  1 MG capsule  ? VENTOLIN HFA 108 (90 Base) MCG/ACT inhaler  ? ? ?Myra Gianotti, PA-C ?Surgical Short Stay/Anesthesiology ?[facility] Phone [phone] ?[facility] Phone [phone] ?08/10/2021 2:11 PM ? ? ? ? ? ? ?

## 2021-08-10 NOTE — Pre-Procedure Instructions (Signed)
SDW CALL ? ?Patient was given pre-op instructions over the phone. The opportunity was given for the patient to ask questions. No further questions asked. Patient verbalized understanding of instructions given. ? ? ?PCP - Lorene Dy. MD ?Nephrology- [facility]- pt unsure of who she sees now ?Cardiologist - denies ? ?PPM/ICD - denies ?Chest x-ray - 06/15/21 ?EKG - 06/16/21 ?Stress Test - prior to kidney transplant 2003 ?ECHO - 06/16/21 ?Cardiac Cath - prior to transplant 2003 ? ?Aspirin Instructions: continue Aspirin ? ?ERAS Protcol -NPO order ? ?COVID TEST- N/A ? ? ?Anesthesia review: Pt reports she has been "retaining fluid" more recently. Pt states she has been using her inhaler more frequently over the last few weeks, but going to dialysis helps her. Pt reports that years ago (2007/2008) she had issues with a possible mini stroke vs seizures. Pt states she is no longer seeing neurologist for this or taking any medication for this. Pt goes [facility] and will go to dialysis today.  ? ?Patient denies shortness of breath, fever, cough and chest pain over the phone call ? ? ? ?Surgical Instructions ? ? ? Your procedure is scheduled on Friday, May 12 ? Report to [facility] Main Entrance "A" at 5:30 A.M., then check in with the Admitting office. ? Call this number if you have problems the morning of surgery: ? [phone] ? ? ? Remember: ? Do not eat or drink after midnight the night before your surgery ? ? Take these medicines the morning of surgery with A SIP OF WATER:  ?labetalol (NORMODYNE) ?amLODipine (NORVASC) 10 MG tablet ?aspirin ?hydrALAZINE (APRESOLINE) ?omeprazole (PRILOSEC)  ?predniSONE (DELTASONE)  ?simvastatin (ZOCOR) ?tacrolimus (PROGRAF)  ?VENTOLIN HFA 108 (90 Base) MCG/ACT inhaler if needed ? ?Please bring all inhalers with you the day of surgery.  ? ?As of today, STOP taking any Aleve, Naproxen, Ibuprofen, Motrin, Advil, Goody's, BC's, all herbal medications, fish oil, and all  vitamins. ? ?[facility] is not responsible for any belongings or valuables.  ?  ?Contacts, glasses, hearing aids, dentures or partials may not be worn into surgery, please bring cases for these belongings ?  ?Patients discharged the day of surgery will not be allowed to drive home, and someone needs to stay with them for 24 hours. ? ? ?SURGICAL WAITING ROOM VISITATION ?No visitors are allowed in pre-op area with patient.  ?Patients having surgery or a procedure in a hospital may have two support people in the waiting room. ?Children under the age of 61 must have an adult with them who is not the patient. ?They may stay in the waiting area during the procedure and may switch out with other visitors. If the patient needs to stay at the hospital during part of their recovery, the visitor guidelines for inpatient rooms apply. ? ?Please refer to the [facility] website for the visitor guidelines for Inpatients (after your surgery is over and you are in a regular room).  ? ? ? ?Special instructions:   ? ?Oral Hygiene is also important to reduce your risk of infection.  Remember - BRUSH YOUR TEETH THE MORNING OF SURGERY WITH YOUR REGULAR TOOTHPASTE ? ? ?Day of Surgery: ? ?Take a shower the day of or night before with antibacterial soap. ?Wear Clean/Comfortable clothing the morning of surgery ?Do not apply any deodorants/lotions.   ?Do not wear jewelry or makeup ?Do not wear lotions, powders, perfumes/colognes, or deodorant. ?Do not shave 48 hours prior to surgery.  Men may shave face and neck. ?Do  not bring valuables to the hospital. ?Do not wear nail polish, gel polish, artificial nails, or any other type of covering on natural nails (fingers and toes) ?If you have artificial nails or gel coating that need to be removed by a nail salon, please have this removed prior to surgery. Artificial nails or gel coating may interfere with anesthesia's ability to adequately monitor your vital signs. ?Remember to brush your teeth  WITH YOUR REGULAR TOOTHPASTE. ? ? ? ? ? ?

## 2021-08-10 NOTE — Anesthesia Preprocedure Evaluation (Addendum)
Anesthesia Evaluation  ?Patient identified by MRN, date of birth, ID band ?Patient awake ? ? ? ?Reviewed: ?Allergy & Precautions, H&P , NPO status , Patient's Chart, lab work & pertinent test results ? ?Airway ?Mallampati: II ? ? ?Neck ROM: full ? ? ? Dental ?  ?Pulmonary ?former smoker,  ?  ?breath sounds clear to auscultation ? ? ? ? ? ? Cardiovascular ?hypertension,  ?Rhythm:regular Rate:Normal ? ?Echo 06/16/21 (in setting of hypertensive urgency/hypervolemia/acute renal failure/failed DDKT requiring urgent hemodialysis):  ?IMPRESSIONS  ??1. Left ventricular ejection fraction, by estimation, is 60 to 65%. The  ?left ventricle has normal function. The left ventricle has no regional  ?wall motion abnormalities. There is mild left ventricular hypertrophy.  ?Left ventricular diastolic parameters  ?are consistent with Grade II diastolic dysfunction (pseudonormalization).  ??2. Right ventricular systolic function is normal. The right ventricular  ?size is normal. There is severely elevated pulmonary artery systolic  ?pressure. The estimated right ventricular systolic pressure is 82.5 mmHg.  ??3. Left atrial size was severely dilated.  ??4. Right atrial size was mildly dilated.  ??5. The mitral valve is abnormal. Trivial mitral valve regurgitation. Mean  ?MV gradient 9 mmHg with MVA 1.79 cm^2 by VTI. However, the valve appears  ?to open well visually, ?elevated gradient with high flow in HD patient.  ?Overall, suspect mild to moderate  ?mitral stenosis. Moderate mitral annular calcification.  ??6. The aortic valve is tricuspid. There is moderate calcification of the  ?aortic valve. Aortic valve regurgitation is not visualized. Aortic valve  ?sclerosis/calcification is present, without any evidence of aortic  ?stenosis.  ??7. The inferior vena cava is normal in size with greater than 50%  ?respiratory variability, suggesting right atrial pressure of 3 mmHg.  ??8. A small pericardial  effusion is present. The pericardial effusion is  ?circumferential. There is no evidence of cardiac tamponade.  ?  ?Neuro/Psych ?Seizures -,  CVA   ? GI/Hepatic ?GERD  ,  ?Endo/Other  ? ? Renal/GU ?ESRFRenal disease  ? ?  ?Musculoskeletal ? ?(+) Arthritis ,  ? Abdominal ?  ?Peds ? Hematology ?  ?Anesthesia Other Findings ? ? Reproductive/Obstetrics ? ?  ? ? ? ? ? ? ? ? ? ? ? ? ? ?  ?  ? ? ? ? ? ? ? ?Anesthesia Physical ?Anesthesia Plan ? ?ASA: 3 ? ?Anesthesia Plan: General  ? ?Post-op Pain Management:   ? ?Induction: Intravenous ? ?PONV Risk Score and Plan: 3 and Ondansetron, Dexamethasone, Midazolam and Treatment may vary due to age or medical condition ? ?Airway Management Planned: LMA ? ?Additional Equipment:  ? ?Intra-op Plan:  ? ?Post-operative Plan: Extubation in OR ? ?Informed Consent: I have reviewed the patients History and Physical, chart, labs and discussed the procedure including the risks, benefits and alternatives for the proposed anesthesia with the patient or authorized representative who has indicated his/her understanding and acceptance.  ? ? ? ?Dental advisory given ? ?Plan Discussed with: CRNA, Anesthesiologist and Surgeon ? ?Anesthesia Plan Comments: (PAT note written 08/10/2021 by Myra Gianotti, PA-C. ?)  ? ? ? ? ? ?Anesthesia Quick Evaluation ? ?

## 2021-08-11 ENCOUNTER — Other Ambulatory Visit: Payer: Self-pay

## 2021-08-11 ENCOUNTER — Ambulatory Visit (HOSPITAL_COMMUNITY): Payer: BC Managed Care – PPO | Admitting: Vascular Surgery

## 2021-08-11 ENCOUNTER — Ambulatory Visit (HOSPITAL_COMMUNITY): Payer: BC Managed Care – PPO

## 2021-08-11 ENCOUNTER — Ambulatory Visit (HOSPITAL_COMMUNITY)
Admission: RE | Admit: 2021-08-11 | Discharge: 2021-08-11 | Disposition: A | Discharge status: Home / Self Care | Payer: BC Managed Care – PPO | Attending: Vascular Surgery | Admitting: Vascular Surgery

## 2021-08-11 ENCOUNTER — Encounter (HOSPITAL_COMMUNITY)
Admission: RE | Disposition: A | Discharge status: Home / Self Care | Payer: Self-pay | Source: Home / Self Care | Attending: Vascular Surgery

## 2021-08-11 ENCOUNTER — Encounter (HOSPITAL_COMMUNITY): Payer: Self-pay | Admitting: Vascular Surgery

## 2021-08-11 DIAGNOSIS — Z940 Kidney transplant status: Secondary | ICD-10-CM | POA: Diagnosis not present

## 2021-08-11 DIAGNOSIS — Y841 Kidney dialysis as the cause of abnormal reaction of the patient, or of later complication, without mention of misadventure at the time of the procedure: Secondary | ICD-10-CM | POA: Diagnosis not present

## 2021-08-11 DIAGNOSIS — E785 Hyperlipidemia, unspecified: Secondary | ICD-10-CM | POA: Insufficient documentation

## 2021-08-11 DIAGNOSIS — N185 Chronic kidney disease, stage 5: Secondary | ICD-10-CM

## 2021-08-11 DIAGNOSIS — T82510A Breakdown (mechanical) of surgically created arteriovenous fistula, initial encounter: Secondary | ICD-10-CM | POA: Diagnosis present

## 2021-08-11 DIAGNOSIS — N186 End stage renal disease: Secondary | ICD-10-CM | POA: Insufficient documentation

## 2021-08-11 DIAGNOSIS — Z992 Dependence on renal dialysis: Secondary | ICD-10-CM | POA: Insufficient documentation

## 2021-08-11 DIAGNOSIS — I120 Hypertensive chronic kidney disease with stage 5 chronic kidney disease or end stage renal disease: Secondary | ICD-10-CM | POA: Insufficient documentation

## 2021-08-11 DIAGNOSIS — Z87891 Personal history of nicotine dependence: Secondary | ICD-10-CM | POA: Insufficient documentation

## 2021-08-11 HISTORY — PX: REVISON OF ARTERIOVENOUS FISTULA: SHX6074

## 2021-08-11 HISTORY — DX: Cerebral infarction, unspecified: I63.9

## 2021-08-11 HISTORY — PX: INSERTION OF DIALYSIS CATHETER: SHX1324

## 2021-08-11 LAB — POCT I-STAT, CHEM 8
BUN: 20 mg/dL (ref 8–23)
Calcium, Ion: 1.02 mmol/L — ABNORMAL LOW (ref 1.15–1.40)
Chloride: 98 mmol/L (ref 98–111)
Creatinine, Ser: 3.2 mg/dL — ABNORMAL HIGH (ref 0.44–1.00)
Glucose, Bld: 88 mg/dL (ref 70–99)
HCT: 40 % (ref 36.0–46.0)
Hemoglobin: 13.6 g/dL (ref 12.0–15.0)
Potassium: 3.4 mmol/L — ABNORMAL LOW (ref 3.5–5.1)
Sodium: 140 mmol/L (ref 135–145)
TCO2: 31 mmol/L (ref 22–32)

## 2021-08-11 SURGERY — REVISON OF ARTERIOVENOUS FISTULA
Anesthesia: General | Site: Chest | Laterality: Right

## 2021-08-11 MED ORDER — DEXAMETHASONE SODIUM PHOSPHATE 10 MG/ML IJ SOLN
INTRAMUSCULAR | Status: DC | PRN
  Administered 2021-08-11: 4 mg via INTRAVENOUS

## 2021-08-11 MED ORDER — CHLORHEXIDINE GLUCONATE 0.12 % MT SOLN
15.0000 mL | OROMUCOSAL | Status: AC
  Administered 2021-08-11: 15 mL via OROMUCOSAL
  Filled 2021-08-11 (×2): qty 15

## 2021-08-11 MED ORDER — SODIUM CHLORIDE 0.9 % IV SOLN: INTRAVENOUS | Status: DC

## 2021-08-11 MED ORDER — 0.9 % SODIUM CHLORIDE (POUR BTL) OPTIME
TOPICAL | Status: DC | PRN
  Administered 2021-08-11: 1000 mL

## 2021-08-11 MED ORDER — ONDANSETRON HCL 4 MG/2ML IJ SOLN: 4.0000 mg | Freq: Four times a day (QID) | INTRAMUSCULAR | Status: DC | PRN

## 2021-08-11 MED ORDER — LIDOCAINE-EPINEPHRINE (PF) 1 %-1:200000 IJ SOLN
INTRAMUSCULAR | Status: AC
  Filled 2021-08-11: qty 30

## 2021-08-11 MED ORDER — MIDAZOLAM HCL 2 MG/2ML IJ SOLN
INTRAMUSCULAR | Status: DC | PRN
  Administered 2021-08-11: 2 mg via INTRAVENOUS

## 2021-08-11 MED ORDER — MIDAZOLAM HCL 2 MG/2ML IJ SOLN
INTRAMUSCULAR | Status: AC
  Filled 2021-08-11: qty 2

## 2021-08-11 MED ORDER — EPHEDRINE SULFATE-NACL 50-0.9 MG/10ML-% IV SOSY
PREFILLED_SYRINGE | INTRAVENOUS | Status: DC | PRN
Start: 2021-08-11 — End: 2021-08-11
  Administered 2021-08-11 (×3): 5 mg via INTRAVENOUS
  Administered 2021-08-11: 10 mg via INTRAVENOUS

## 2021-08-11 MED ORDER — HEPARIN SODIUM (PORCINE) 1000 UNIT/ML IJ SOLN
INTRAMUSCULAR | Status: DC | PRN
Start: 2021-08-11 — End: 2021-08-11
  Administered 2021-08-11: 3800 [IU] via INTRAVENOUS

## 2021-08-11 MED ORDER — FENTANYL CITRATE (PF) 250 MCG/5ML IJ SOLN
INTRAMUSCULAR | Status: AC
  Filled 2021-08-11: qty 5

## 2021-08-11 MED ORDER — OXYCODONE HCL 5 MG PO TABS
ORAL_TABLET | ORAL | Status: AC
  Filled 2021-08-11: qty 1

## 2021-08-11 MED ORDER — STERILE WATER FOR IRRIGATION IR SOLN
Status: DC | PRN
  Administered 2021-08-11: 1000 mL

## 2021-08-11 MED ORDER — PROPOFOL 10 MG/ML IV BOLUS
INTRAVENOUS | Status: AC
  Filled 2021-08-11: qty 20

## 2021-08-11 MED ORDER — OXYCODONE-ACETAMINOPHEN 5-325 MG PO TABS: 1.0000 | ORAL_TABLET | Freq: Four times a day (QID) | ORAL | 0 refills | Status: DC | PRN

## 2021-08-11 MED ORDER — CHLORHEXIDINE GLUCONATE 4 % EX LIQD: 60.0000 mL | Freq: Once | CUTANEOUS | Status: DC

## 2021-08-11 MED ORDER — FENTANYL CITRATE (PF) 100 MCG/2ML IJ SOLN
INTRAMUSCULAR | Status: AC
  Filled 2021-08-11: qty 2

## 2021-08-11 MED ORDER — LIDOCAINE 2% (20 MG/ML) 5 ML SYRINGE
INTRAMUSCULAR | Status: DC | PRN
  Administered 2021-08-11: 40 mg via INTRAVENOUS

## 2021-08-11 MED ORDER — HEPARIN 6000 UNIT IRRIGATION SOLUTION
Status: AC
  Filled 2021-08-11: qty 500

## 2021-08-11 MED ORDER — HEPARIN 6000 UNIT IRRIGATION SOLUTION
Status: DC | PRN
  Administered 2021-08-11: 1

## 2021-08-11 MED ORDER — PHENYLEPHRINE HCL-NACL 20-0.9 MG/250ML-% IV SOLN
INTRAVENOUS | Status: DC | PRN
  Administered 2021-08-11: 25 ug/min via INTRAVENOUS

## 2021-08-11 MED ORDER — FENTANYL CITRATE (PF) 250 MCG/5ML IJ SOLN
INTRAMUSCULAR | Status: DC | PRN
  Administered 2021-08-11: 50 ug via INTRAVENOUS

## 2021-08-11 MED ORDER — CEFAZOLIN SODIUM-DEXTROSE 2-4 GM/100ML-% IV SOLN
2.0000 g | INTRAVENOUS | Status: AC
  Administered 2021-08-11: 2 g via INTRAVENOUS
  Filled 2021-08-11: qty 100

## 2021-08-11 MED ORDER — PROPOFOL 10 MG/ML IV BOLUS
INTRAVENOUS | Status: DC | PRN
  Administered 2021-08-11: 120 mg via INTRAVENOUS
  Administered 2021-08-11: 20 mg via INTRAVENOUS

## 2021-08-11 MED ORDER — FENTANYL CITRATE (PF) 100 MCG/2ML IJ SOLN
25.0000 ug | INTRAMUSCULAR | Status: DC | PRN
  Administered 2021-08-11: 25 ug via INTRAVENOUS

## 2021-08-11 MED ORDER — OXYCODONE HCL 5 MG/5ML PO SOLN: 5.0000 mg | Freq: Once | ORAL | Status: AC | PRN

## 2021-08-11 MED ORDER — ONDANSETRON HCL 4 MG/2ML IJ SOLN
INTRAMUSCULAR | Status: DC | PRN
  Administered 2021-08-11: 4 mg via INTRAVENOUS

## 2021-08-11 MED ORDER — HEPARIN SODIUM (PORCINE) 1000 UNIT/ML IJ SOLN
INTRAMUSCULAR | Status: DC | PRN
  Administered 2021-08-11: 3000 [IU] via INTRAVENOUS

## 2021-08-11 MED ORDER — OXYCODONE HCL 5 MG PO TABS
5.0000 mg | ORAL_TABLET | Freq: Once | ORAL | Status: AC | PRN
  Administered 2021-08-11: 5 mg via ORAL

## 2021-08-11 MED ORDER — HEPARIN SODIUM (PORCINE) 1000 UNIT/ML IJ SOLN
INTRAMUSCULAR | Status: AC
  Filled 2021-08-11: qty 10

## 2021-08-11 MED ORDER — SODIUM CHLORIDE 0.9 % IV SOLN
INTRAVENOUS | Status: DC
Start: 2021-08-11 — End: 2021-08-11

## 2021-08-11 SURGICAL SUPPLY — 64 items
ADH SKN CLS APL DERMABOND .7 (GAUZE/BANDAGES/DRESSINGS) ×4
AGENT HMST SPONGE THK3/8 (HEMOSTASIS)
ARMBAND PINK RESTRICT EXTREMIT (MISCELLANEOUS) ×4 IMPLANT
BAG COUNTER SPONGE SURGICOUNT (BAG) ×4 IMPLANT
BAG SPNG CNTER NS LX DISP (BAG) ×2
BIOPATCH RED 1 DISK 7.0 (GAUZE/BANDAGES/DRESSINGS) ×4 IMPLANT
BNDG ELASTIC 4X5.8 VLCR STR LF (GAUZE/BANDAGES/DRESSINGS) ×2 IMPLANT
BNDG GAUZE ELAST 4 BULKY (GAUZE/BANDAGES/DRESSINGS) ×1 IMPLANT
CANISTER SUCT 3000ML PPV (MISCELLANEOUS) ×4 IMPLANT
CATH PALINDROME-P 23CM W/VT (CATHETERS) ×1 IMPLANT
CLIP VESOCCLUDE MED 6/CT (CLIP) ×4 IMPLANT
CLIP VESOCCLUDE SM WIDE 6/CT (CLIP) ×4 IMPLANT
COVER PROBE W GEL 5X96 (DRAPES) ×5 IMPLANT
COVER SURGICAL LIGHT HANDLE (MISCELLANEOUS) ×4 IMPLANT
DECANTER SPIKE VIAL GLASS SM (MISCELLANEOUS) ×4 IMPLANT
DERMABOND ADVANCED (GAUZE/BANDAGES/DRESSINGS) ×2
DERMABOND ADVANCED .7 DNX12 (GAUZE/BANDAGES/DRESSINGS) IMPLANT
DRAPE CHEST BREAST 15X10 FENES (DRAPES) ×3 IMPLANT
DRAPE ORTHO SPLIT 77X108 STRL (DRAPES) ×3
DRAPE SURG ORHT 6 SPLT 77X108 (DRAPES) IMPLANT
ELECT REM PT RETURN 9FT ADLT (ELECTROSURGICAL) ×3
ELECTRODE REM PT RTRN 9FT ADLT (ELECTROSURGICAL) ×3 IMPLANT
GAUZE 4X4 16PLY ~~LOC~~+RFID DBL (SPONGE) ×6 IMPLANT
GLOVE BIO SURGEON STRL SZ 6 (GLOVE) ×1 IMPLANT
GLOVE BIO SURGEON STRL SZ 6.5 (GLOVE) ×4 IMPLANT
GLOVE BIO SURGEON STRL SZ7.5 (GLOVE) ×5 IMPLANT
GLOVE BIOGEL PI IND STRL 8 (GLOVE) ×3 IMPLANT
GLOVE BIOGEL PI INDICATOR 8 (GLOVE) ×2
GOWN STRL REUS W/ TWL LRG LVL3 (GOWN DISPOSABLE) ×6 IMPLANT
GOWN STRL REUS W/ TWL XL LVL3 (GOWN DISPOSABLE) ×6 IMPLANT
GOWN STRL REUS W/TWL LRG LVL3 (GOWN DISPOSABLE) ×9
GOWN STRL REUS W/TWL XL LVL3 (GOWN DISPOSABLE) ×6
HEMOSTAT SPONGE AVITENE ULTRA (HEMOSTASIS) IMPLANT
KIT BASIN OR (CUSTOM PROCEDURE TRAY) ×4 IMPLANT
KIT MICROPUNCTURE NIT STIFF (SHEATH) ×1 IMPLANT
KIT PALINDROME-P 55CM (CATHETERS) IMPLANT
KIT TURNOVER KIT B (KITS) ×4 IMPLANT
NDL 18GX1X1/2 (RX/OR ONLY) (NEEDLE) ×3 IMPLANT
NDL HYPO 25GX1X1/2 BEV (NEEDLE) ×3 IMPLANT
NEEDLE 18GX1X1/2 (RX/OR ONLY) (NEEDLE) ×3 IMPLANT
NEEDLE HYPO 25GX1X1/2 BEV (NEEDLE) ×3 IMPLANT
NS IRRIG 1000ML POUR BTL (IV SOLUTION) ×4 IMPLANT
PACK CV ACCESS (CUSTOM PROCEDURE TRAY) ×4 IMPLANT
PACK SURGICAL SETUP 50X90 (CUSTOM PROCEDURE TRAY) ×4 IMPLANT
PAD ARMBOARD 7.5X6 YLW CONV (MISCELLANEOUS) ×8 IMPLANT
PENCIL BUTTON HOLSTER BLD 10FT (ELECTRODE) ×1 IMPLANT
SET MICROPUNCTURE 5F STIFF (MISCELLANEOUS) ×1 IMPLANT
SPONGE T-LAP 18X18 ~~LOC~~+RFID (SPONGE) ×1 IMPLANT
STAPLER VISISTAT 35W (STAPLE) IMPLANT
SUT ETHILON 3 0 PS 1 (SUTURE) ×4 IMPLANT
SUT MNCRL AB 3-0 PS2 18 (SUTURE) ×1 IMPLANT
SUT MNCRL AB 4-0 PS2 18 (SUTURE) ×7 IMPLANT
SUT PROLENE 5 0 C 1 24 (SUTURE) ×7 IMPLANT
SUT PROLENE 6 0 BV (SUTURE) ×1 IMPLANT
SUT VIC AB 3-0 SH 27 (SUTURE) ×3
SUT VIC AB 3-0 SH 27X BRD (SUTURE) ×3 IMPLANT
SYR 10ML LL (SYRINGE) ×4 IMPLANT
SYR 20ML LL LF (SYRINGE) ×8 IMPLANT
SYR 5ML LL (SYRINGE) ×4 IMPLANT
SYR CONTROL 10ML LL (SYRINGE) ×4 IMPLANT
TOWEL GREEN STERILE (TOWEL DISPOSABLE) ×4 IMPLANT
TOWEL GREEN STERILE FF (TOWEL DISPOSABLE) ×4 IMPLANT
UNDERPAD 30X36 HEAVY ABSORB (UNDERPADS AND DIAPERS) ×4 IMPLANT
WATER STERILE IRR 1000ML POUR (IV SOLUTION) ×4 IMPLANT

## 2021-08-11 NOTE — Transfer of Care (Signed)
Immediate Anesthesia Transfer of Care Note ? ?Patient: Karen [name] ? ?Procedure(s) Performed: RIGHT ARM ARTERIOVENOUS FISTULA REVISION WITH ANEURYSM REMOVAL (Right: Arm Upper) ?INSERTION LEFT INTERNAL JUGULAR TUNNELED DIALYSIS CATHETER (Left: Chest) ? ?Patient Location: PACU ? ?Anesthesia Type:General ? ?Level of Consciousness: drowsy and patient cooperative ? ?Airway & Oxygen Therapy: Patient Spontanous Breathing and Patient connected to face mask oxygen ? ?Post-op Assessment: Report given to RN and Post -op Vital signs reviewed and stable ? ?Post vital signs: Reviewed and stable ? ?Last Vitals:  ?Vitals Value Taken Time  ?BP 116/58 08/11/21 0951  ?Temp    ?Pulse 55 08/11/21 0953  ?Resp 14 08/11/21 0953  ?SpO2 97 % 08/11/21 0953  ?Vitals shown include unvalidated device data. ? ?Last Pain:  ?Vitals:  ? 08/11/21 0604  ?TempSrc:   ?PainSc: 0-No pain  ?   ? ?Patients Stated Pain Goal: 0 (08/11/21 0604) ? ?Complications: No notable events documented. ?

## 2021-08-11 NOTE — H&P (Signed)
History and Physical Interval Note: ? ?08/11/2021 ?7:21 AM ? ?Karen [name]  has presented today for surgery, with the diagnosis of ESRD.  The various methods of treatment have been discussed with the patient and family. After consideration of risks, benefits and other options for treatment, the patient has consented to  Procedure(s): ?RIGHT ARM ARTERIOVENOUS FISTULA REVISION (Right) ?INSERTION OF TUNNELED DIALYSIS CATHETER (N/A) as a surgical intervention.  The patient's history has been reviewed, patient examined, no change in status, stable for surgery.  I have reviewed the patient's chart and labs.  Questions were answered to the patient's satisfaction.   ? ?Right arm AVF revision with placement of TDC. ? ?Karen [name] ? ?Patient name: Karen [name]           MRN: [id number]        DOB: 1956-01-13        Sex: female ?  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  ?HPI: ?Karen [name] is a 66 y.o. 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  Patient states this was placed around 2008 here in our practice by Dr. Kellie Simmering.  She had a functioning kidney transplant up until about a month ago.  She has gone back to using her right arm AV fistula and has had several bleeding events.  She also states a small scab/ulcer formed where they have been sticking it. ?  ?    ?Past Medical History:  ?Diagnosis Date  ? Anemia associated with chronic renal failure    ? Arthritis    ? Convulsions/seizures ([place]) 01/28/2013  ? Dyslipidemia    ? GERD (gastroesophageal reflux disease)    ? Hyperlipidemia    ? Hypertension    ? Kidney transplanted    ? Seizure disorder ([place])    ?  ?  ?     ?Past Surgical History:  ?Procedure Laterality Date  ? CATHETER REMOVAL      ? COLONOSCOPY      ? GALLBLADDER SURGERY      ? KIDNEY TRANSPLANT      ?   04/10/2009  ?  ?  ?     ?Family History  ?Problem Relation Age of Onset  ? Stroke Mother    ? Seizures Mother    ? Migraines Mother    ? Diabetes Brother    ? Colon cancer Neg Hx    ? Esophageal cancer Neg Hx    ? Stomach cancer Neg Hx    ? Rectal cancer Neg Hx    ?  ?  ?SOCIAL HISTORY: ?Social History  ?  ?     ?Socioeconomic History  ? Marital status: Single  ?    Spouse name: Not on file  ? Number of children: 1  ? Years of education: 11  ? Highest education level: Not on file  ?Occupational History  ? Occupation: CNA  ?Tobacco Use  ? Smoking status: Former  ?    Types: Cigarettes  ? Smokeless tobacco: Never  ? Tobacco comments:  ?    quit smoking 25 yrs ago  ?Substance and Sexual Activity  ? Alcohol use: No  ?    Comment: quit 25 years ago  ? Drug use: No  ? Sexual activity: Not on file  ?Other Topics Concern  ? Not on file  ?Social History Narrative  ?  Right handed  ?  No caffeine  ?  One story home  ?  ?Social Determinants of Health  ?  ?Financial Resource Strain: Not on file  ?Food Insecurity: Not on file  ?Transportation Needs: Not on file  ?Physical Activity: Not on file  ?Stress: Not on file  ?Social Connections: Not on file  ?Intimate Partner Violence: Not on file  ?  ?  ?No Known Allergies ?  ?      ?Current Outpatient Medications  ?Medication Sig Dispense Refill  ? amLODipine (NORVASC) 10 MG tablet Take 10 mg by mouth daily.      ? aspirin EC 81 MG tablet Take 81 mg by mouth daily.      ? calcitRIOL (ROCALTROL) 0.25 MCG capsule Take 0.25 mcg by mouth daily. Mon,Wed Fri      ? magnesium oxide (MAG-OX) 400 MG tablet Take 400 mg by mouth 2 (two) times daily.       ? omeprazole (PRILOSEC) 20 MG capsule Take 20 mg by mouth daily.      ? predniSONE (DELTASONE) 5 MG tablet Take 5 mg by mouth daily.      ? simvastatin (ZOCOR) 20 MG tablet Take 20 mg by mouth every evening.      ? tacrolimus (PROGRAF) 1 MG capsule Take 2 capsules (2 mg total) by mouth 2 (two) times daily.      ? VENTOLIN HFA 108 (90 Base)  MCG/ACT inhaler Inhale 1 puff into the lungs 3 times/day as needed-between meals & bedtime.      ? azithromycin (ZITHROMAX) 250 MG tablet Take 250 mg by mouth as directed. Started on 06-13-21 x 5 days (Patient not taking: Reported on 08/04/2021)      ? hydrALAZINE (APRESOLINE) 100 MG tablet Take 1 tablet (100 mg total) by mouth 3 (three) times daily. 90 tablet 0  ? labetalol (NORMODYNE) 300 MG tablet Take 1 tablet (300 mg total) by mouth 2 (two) times daily. 60 tablet 0  ? losartan (COZAAR) 100 MG tablet Take 1 tablet (100 mg total) by mouth daily. 30 tablet 0  ?  ?No current facility-administered medications for this visit.  ?  ?  ?REVIEW OF SYSTEMS:  ?'[X]'$  denotes positive finding, '[ ]'$  denotes negative finding ?Cardiac   Comments:  ?Chest pain or chest pressure:      ?Shortness of breath upon exertion:      ?Short of breath when lying flat:      ?Irregular heart rhythm:      ?       ?Vascular      ?Pain in calf, thigh, or hip brought on by ambulation:      ?Pain in feet at night that wakes you up from your sleep:       ?Blood clot in your veins:      ?Leg swelling:       ?       ?Pulmonary      ?Oxygen at home:      ?Productive cough:       ?Wheezing:       ?       ?Neurologic      ?Sudden weakness in arms or legs:       ?Sudden numbness in arms or legs:       ?Sudden onset of difficulty speaking or slurred speech:      ?Temporary loss of vision in one eye:       ?Problems with dizziness:       ?       ?  Gastrointestinal      ?Blood in stool:       ?Vomited blood:       ?       ?Genitourinary      ?Burning when urinating:       ?Blood in urine:      ?       ?Psychiatric      ?Major depression:       ?       ?Hematologic      ?Bleeding problems:      ?Problems with blood clotting too easily:      ?       ?Skin      ?Rashes or ulcers:      ?       ?Constitutional      ?Fever or chills:      ?  ?  ?PHYSICAL EXAM: ?    ?Vitals:  ?  08/04/21 1120 08/04/21 1124  ?BP: (!) 195/80 (!) 178/60  ?Pulse: 73 75  ?Resp: 14    ?Temp:  97.9 ?F (36.6 ?C)    ?TempSrc: Temporal    ?SpO2: 94%    ?Weight: 118 lb (53.5 kg)    ?Height: 5' (1.524 m)    ?  ?  ?GENERAL: The patient is a well-nourished female, in no acute distress. The vital signs are documented above. ?CARDIAC: There is a regular rate and rhythm.  ?VASCULAR:  ?Right brachiocephalic AV fistula with good thrill ?PULMONARY: No respiratory distress. ?ABDOMEN: Soft and non-tender. ?MUSCULOSKELETAL: There are no major deformities or cyanosis. ?NEUROLOGIC: No focal weakness or paresthesias are detected. ?PSYCHIATRIC: The patient has a normal affect. ?  ?  ?  ?DATA:  ?  ?N/A ?  ?Assessment/Plan: ?  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  I discussed in detail with her that just because she has an aneurysmal fistula does not mean this requires repair.  That being said she does have a small ulcer that is forming and has had bleeding events.  I have recommended revision in the OR with excision of the aneurysm with primary repair versus plication with a TDC placement to allow this to heal.  I discussed we will take up to 1 month to heal.  I have offered to do this next week and she wants Friday.  Discussed this being done as an outpatient in the OR at [facility].  Risks benefits discussed. ?  ?  ?Karen Heck, MD ?[facility] ?Office: [phone] ?

## 2021-08-11 NOTE — Progress Notes (Signed)
Patient up to chair alert talkative skin warm and dry awaiting brother to transport home ?

## 2021-08-11 NOTE — Discharge Instructions (Signed)
? ?  [facility] ? ?Discharge Instructions ? ?AV Fistula or Graft Surgery for Dialysis Access ? ?Please refer to the following instructions for your post-procedure care. Your surgeon or physician assistant will discuss any changes with you. ? ?Activity ? ?You may drive the day following your surgery, if you are comfortable and no longer taking prescription pain medication. Resume full activity as the soreness in your incision resolves. ? ?Bathing/Showering ? ?You may shower after you go home. Keep your incision dry for 48 hours. Do not soak in a bathtub, hot tub, or swim until the incision heals completely. You may not shower if you have a hemodialysis catheter. ? ?Incision Care ? ?Clean your incision with mild soap and water after 48 hours. Pat the area dry with a clean towel. You do not need a bandage unless otherwise instructed. Do not apply any ointments or creams to your incision. You may have skin glue on your incision. Do not peel it off. It will come off on its own in about one week. Your arm may swell a bit after surgery. To reduce swelling use pillows to elevate your arm so it is above your heart. Your doctor will tell you if you need to lightly wrap your arm with an ACE bandage. ? ?Diet ? ?Resume your normal diet. There are not special food restrictions following this procedure. In order to heal from your surgery, it is CRITICAL to get adequate nutrition. Your body requires vitamins, minerals, and protein. Vegetables are the best source of vitamins and minerals. Vegetables also provide the perfect balance of protein. Processed food has little nutritional value, so try to avoid this. ? ?Medications ? ?Resume taking all of your medications. If your incision is causing pain, you may take over-the counter pain relievers such as acetaminophen (Tylenol). If you were prescribed a stronger pain medication, please be aware these medications can cause nausea and constipation. Prevent  nausea by taking the medication with a snack or meal. Avoid constipation by drinking plenty of fluids and eating foods with high amount of fiber, such as fruits, vegetables, and grains.  ?Do not take Tylenol if you are taking prescription pain medications. ? ?Follow up ?Your surgeon may want to see you in the office following your access surgery. If so, this will be arranged at the time of your surgery. ? ?Please call us immediately for any of the following conditions: ? ?Increased pain, redness, drainage (pus) from your incision site ?Fever of 101 degrees or higher ?Severe or worsening pain at your incision site ?Hand pain or numbness. ? ?Reduce your risk of vascular disease: ? ?Stop smoking. If you would like help, call [facility] at 1-800-QUIT-NOW 754-320-8439) or [place] at [phone] ? ?Manage your cholesterol ?Maintain a desired weight ?Control your diabetes ?Keep your blood pressure down ? ?Dialysis ? ?It will take several weeks to several months for your new dialysis access to be ready for use. Your surgeon will determine when it is okay to use it. Your nephrologist will continue to direct your dialysis. You can continue to use your Permcath until your new access is ready for use. ? ? ?08/11/2021 ?Karen [name] ?[id number] ?[birth year] ? ?Surgeon(s): ?Marty Heck, MD ? ?Procedure(s): ?RIGHT ARM ARTERIOVENOUS FISTULA REVISION WITH ANEURYSM REMOVAL ?INSERTION OF TUNNELED DIALYSIS CATHETER ? ?x Do not stick fistula for 6 weeks  ? ? ?If you have any questions, please call the office at [phone]. ? ?

## 2021-08-11 NOTE — Anesthesia Postprocedure Evaluation (Signed)
Anesthesia Post Note ? ?Patient: Karen [name] ? ?Procedure(s) Performed: RIGHT ARM ARTERIOVENOUS FISTULA REVISION WITH ANEURYSM REMOVAL (Right: Arm Upper) ?INSERTION LEFT INTERNAL JUGULAR TUNNELED DIALYSIS CATHETER (Left: Chest) ? ?  ? ?Patient location during evaluation: PACU ?Anesthesia Type: General ?Level of consciousness: awake and alert ?Pain management: pain level controlled ?Vital Signs Assessment: post-procedure vital signs reviewed and stable ?Respiratory status: spontaneous breathing, nonlabored ventilation, respiratory function stable and patient connected to nasal cannula oxygen ?Cardiovascular status: blood pressure returned to baseline and stable ?Postop Assessment: no apparent nausea or vomiting ?Anesthetic complications: no ? ? ?No notable events documented. ? ?Last Vitals:  ?Vitals:  ? 08/11/21 1050 08/11/21 1105  ?BP: 135/60 (!) 151/63  ?Pulse: (!) 54 (!) 54  ?Resp: (!) 8 10  ?Temp:    ?SpO2: 96% 93%  ?  ?Last Pain:  ?Vitals:  ? 08/11/21 1050  ?TempSrc:   ?PainSc: Asleep  ? ? ?  ?  ?  ?  ?  ?  ? ?[place]and[place] S ? ? ? ? ?

## 2021-08-11 NOTE — Anesthesia Procedure Notes (Signed)
Procedure Name: LMA Insertion ?Date/Time: 08/11/2021 7:39 AM ?Performed by: Bryson Corona, CRNA ?Pre-anesthesia Checklist: Patient identified, Emergency Drugs available, Suction available and Patient being monitored ?Patient Re-evaluated:Patient Re-evaluated prior to induction ?Oxygen Delivery Method: Circle System Utilized ?Preoxygenation: Pre-oxygenation with 100% oxygen ?Induction Type: IV induction ?Ventilation: Mask ventilation without difficulty ?LMA: LMA inserted ?LMA Size: 4.0 ?Number of attempts: 1 ?Placement Confirmation: positive ETCO2 ?Tube secured with: Tape ?Dental Injury: Teeth and Oropharynx as per pre-operative assessment  ? ? ? ? ?

## 2021-08-11 NOTE — Op Note (Signed)
Date: Aug 11, 2021 ? 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 ?Postoperative diagnosis: Same ? ?Procedure: ?1.  Ultrasound-guided access left internal jugular vein ?2.  Placement of left internal jugular vein tunneled dialysis catheter (23 cm palindrome) ?3.  Revision of right arm AV fistula with excision of aneurysm and primary repair ? ?Surgeon: Dr. Marty Heck, MD ? ?Assistant: Leontine Locket, PA ? ?Indications: 66 year old female seen in the office with right brachiocephalic AV fistula placed years ago.  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  The right arm fistula has become more aneurysmal with thinning skin and she had a bleeding event.  She presents today for fistula revision and placement of TDC after risk and  benefits discussed. ? ?Findings: Ultrasound-guided access of the left internal jugular vein with placement of a 23 cm palindrome catheter in the right atrium and the catheter flushed and aspirated easily.  The right internal jugular vein appeared occluded under ultrasound.  I then fully mobilized the right arm AV fistula including the two aneurysms and proximal and distal to the aneurysms.  Ultimately the aneurysms were resected and a new primary end-to-end anastomosis was performed.  The vein did tear in a separate segment in the proximal upper arm where it was thin while we were mobilizing it for more length and this had to be repaired with a new end to end anastomosis as well.  Excellent thrill at completion.  An assistant was needed for exposure and expedite the case and for the anastomosis. ? ?Anesthesia: LMA ? ?Details: Patient was taken to the operating room after informed consent was obtained.  Placed on operative table in the supine position.  Anesthesia was induced with LMA.  The bilateral necks and right arm were then prepped and draped in standard sterile fashion.   Ultimately timeout was performed and antibiotics were given.  Initially evaluated the right internal jugular vein with ultrasound and this appeared occluded with multiple collaterals.  I then evaluated the left internal jugular vein with ultrasound and this was widely patent and image was saved.  The left internal jugular vein was accessed with a micro access needle, placed a microwire, and then a microsheath.  I then advanced the J-wire into the right atrium from the left internal jugular vein and the wire was clamped after the micro sheath was removed.  I measured a 23 cm palindrome catheter on the chest wall.  I then made a counterincision on the left chest wall for the catheter exit site and then I tunneled the catheter from the chest wall exit site to the left internal jugular vein stick site given this was a one-piece catheter and I made sure that the cuff was under the skin.  I then sequentially dilated over the wire and placed a large dilator peel-away sheath into the right atrium.  The inner dilator was removed and then I advanced the palindrome catheter into the right atrium while peeling the sheath away.  The catheter was then positioned in the right atrium and was not kinked under fluoroscopy.  This flushed and aspirated easily.  It was secured with multiple 3-0 nylons and the IJ stick site was closed with a 4-0 Monocryl subcuticular.  Dermabond was applied.  The catheter was loaded according to manufacturer recommendation. ?The right arm was already prepped and draped in the field.  Another timeout was performed.  I made a longitudinal  incision over the two aneurysmal segments of the AV fistula.  I then used Bovie cautery and Metzenbaum scissors and the cephalic vein was then fully mobilized circumferentially throughout the incision.  The fistula was very tortuous and I mobilized proximal and distal to the aneurysms in order to try and make a primary repair once the aneurysms were excised.  We  ultimately got proximal control.  When we got up into the proximal upper arm while we were mobilizing this the cephalic vein did tear and seemed quite fragile.  I elected to perform a new primary end-to-end anastomosis here after the two ends were spatulated.  The patient was given 3000 units of IV heparin.  I used fistula clamps proximal and distal to the segments requiring repair and also marked the fistula for orientation.  Ultimately I used a 5-0 Prolene parachute technique with the help of my assistant to perform a new end to end anastomosis on the upper arm segment where the fistula had a tear.  Once this was repaired, I then resected the two aneurysms and ensured I had mobilized enough of the vein in the upper arm in order to have no tension and a second primary end-to-end anastomosis was performed with 5-0 Prolene parachute technique with the help of my assistant.  The fistula was de-aired prior to completion.  The wound was then copiously irrigated.  There was an excellent thrill in the fistula.  I did have to put some 5-0 Prolene repair stitches in the fistula.  I then closed the the subcutaneous tissue with 3-0 Vicryl and the skin was closed with 4-0 Monocryl and Dermabond.  A gentle compression dressing was applied with Kerlix and ACE. ? ?Complication: None ? ?Condition: Stable ? ?Marty Heck, MD ?[facility] ?Office: [phone] ? ? ?Marty Heck ? ?

## 2021-08-12 ENCOUNTER — Encounter (HOSPITAL_COMMUNITY): Payer: Self-pay | Admitting: Vascular Surgery

## 2021-08-29 ENCOUNTER — Encounter: Payer: BC Managed Care – PPO | Admitting: Vascular Surgery

## 2021-08-30 ENCOUNTER — Ambulatory Visit (INDEPENDENT_AMBULATORY_CARE_PROVIDER_SITE_OTHER): Payer: BC Managed Care – PPO | Admitting: Physician Assistant

## 2021-08-30 VITALS — BP 153/69 | HR 70 | Temp 98.4°F | Resp 20 | Ht 60.0 in | Wt 115.9 lb

## 2021-08-30 DIAGNOSIS — N186 End stage renal disease: Secondary | ICD-10-CM

## 2021-08-30 NOTE — Progress Notes (Signed)
  POST OPERATIVE OFFICE NOTE    CC:  F/u for surgery  HPI:  This is a 66 y.o. 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 Carlis Abbott.    Pt returns today for follow up.  Pt states she has no symptoms of steal.  No weakness, pain or loss of motor or sensation in the right UE.  The left [facility] is working fine without issues.   No Known Allergies  Current Outpatient Medications  Medication Sig Dispense Refill   amLODipine (NORVASC) 10 MG tablet Take 10 mg by mouth daily.     aspirin EC 81 MG tablet Take 81 mg by mouth daily.     calcitRIOL (ROCALTROL) 0.25 MCG capsule Take 0.25 mcg by mouth every Monday, Wednesday, and Friday.     hydrALAZINE (APRESOLINE) 100 MG tablet Take 100 mg by mouth 3 (three) times daily.     labetalol (NORMODYNE) 300 MG tablet Take 300 mg by mouth 2 (two) times daily.     losartan (COZAAR) 100 MG tablet Take 100 mg by mouth daily.     magnesium oxide (MAG-OX) 400 MG tablet Take 400 mg by mouth 2 (two) times daily.      omeprazole (PRILOSEC) 20 MG capsule Take 20 mg by mouth daily.     oxyCODONE-acetaminophen (PERCOCET) 5-325 MG tablet Take 1 tablet by mouth every 6 (six) hours as needed. 20 tablet 0   predniSONE (DELTASONE) 5 MG tablet Take 5 mg by mouth daily.     simvastatin (ZOCOR) 20 MG tablet Take 20 mg by mouth every evening.     tacrolimus (PROGRAF) 1 MG capsule Take 2 capsules (2 mg total) by mouth 2 (two) times daily.     VENTOLIN HFA 108 (90 Base) MCG/ACT inhaler Inhale 1 puff into the lungs 3 times/day as needed-between meals & bedtime.     No current facility-administered medications for this visit.     ROS:  See HPI  Physical Exam:    Incision:  well healing without signs of infection Extremities:  Palpable thrill in fistula, palpable radial pulse, M/N/V intact and equal B UE Lungs :  non labored breathing     Assessment/Plan:  This is a 66 y.o. female who is s/p: Revision of right arm AV fistula with excision of aneurysm and primary repair and placement of TDC left on 08/11/21 by Dr. Carlis Abbott.    The fistula may be accessed as of June 13th 2023.  Once it is working well the [facility] will be discontinued.  F/U PRN     Roxy Horseman PA-C [facility] [phone]   Clinic MD:  Donzetta Matters

## 2021-10-11 ENCOUNTER — Ambulatory Visit: Payer: BC Managed Care – PPO | Admitting: Family Medicine

## 2022-02-27 ENCOUNTER — Encounter (HOSPITAL_COMMUNITY): Payer: Self-pay

## 2022-02-27 ENCOUNTER — Emergency Department (HOSPITAL_COMMUNITY): Payer: BC Managed Care – PPO

## 2022-02-27 ENCOUNTER — Inpatient Hospital Stay (HOSPITAL_COMMUNITY)
Admission: EM | Admit: 2022-02-27 | Discharge: 2022-03-02 | DRG: 193 | Disposition: A | Discharge status: Home / Self Care | Payer: BC Managed Care – PPO | Attending: Internal Medicine | Admitting: Internal Medicine

## 2022-02-27 ENCOUNTER — Encounter (HOSPITAL_COMMUNITY): Payer: Self-pay | Admitting: Emergency Medicine

## 2022-02-27 ENCOUNTER — Other Ambulatory Visit: Payer: Self-pay

## 2022-02-27 DIAGNOSIS — K219 Gastro-esophageal reflux disease without esophagitis: Secondary | ICD-10-CM | POA: Diagnosis present

## 2022-02-27 DIAGNOSIS — E440 Moderate protein-calorie malnutrition: Secondary | ICD-10-CM | POA: Diagnosis present

## 2022-02-27 DIAGNOSIS — Z87891 Personal history of nicotine dependence: Secondary | ICD-10-CM | POA: Diagnosis not present

## 2022-02-27 DIAGNOSIS — E785 Hyperlipidemia, unspecified: Secondary | ICD-10-CM | POA: Diagnosis present

## 2022-02-27 DIAGNOSIS — I10 Essential (primary) hypertension: Secondary | ICD-10-CM | POA: Diagnosis not present

## 2022-02-27 DIAGNOSIS — Z7982 Long term (current) use of aspirin: Secondary | ICD-10-CM | POA: Diagnosis not present

## 2022-02-27 DIAGNOSIS — Z8673 Personal history of transient ischemic attack (TIA), and cerebral infarction without residual deficits: Secondary | ICD-10-CM | POA: Diagnosis not present

## 2022-02-27 DIAGNOSIS — Z79899 Other long term (current) drug therapy: Secondary | ICD-10-CM | POA: Diagnosis not present

## 2022-02-27 DIAGNOSIS — Z20822 Contact with and (suspected) exposure to covid-19: Secondary | ICD-10-CM | POA: Diagnosis present

## 2022-02-27 DIAGNOSIS — Z87898 Personal history of other specified conditions: Secondary | ICD-10-CM

## 2022-02-27 DIAGNOSIS — Z992 Dependence on renal dialysis: Secondary | ICD-10-CM

## 2022-02-27 DIAGNOSIS — G40909 Epilepsy, unspecified, not intractable, without status epilepticus: Secondary | ICD-10-CM | POA: Diagnosis present

## 2022-02-27 DIAGNOSIS — Z7952 Long term (current) use of systemic steroids: Secondary | ICD-10-CM | POA: Diagnosis not present

## 2022-02-27 DIAGNOSIS — Z6824 Body mass index (BMI) 24.0-24.9, adult: Secondary | ICD-10-CM

## 2022-02-27 DIAGNOSIS — D849 Immunodeficiency, unspecified: Secondary | ICD-10-CM | POA: Diagnosis present

## 2022-02-27 DIAGNOSIS — D84821 Immunodeficiency due to drugs: Secondary | ICD-10-CM | POA: Diagnosis present

## 2022-02-27 DIAGNOSIS — N186 End stage renal disease: Secondary | ICD-10-CM | POA: Diagnosis present

## 2022-02-27 DIAGNOSIS — D631 Anemia in chronic kidney disease: Secondary | ICD-10-CM | POA: Diagnosis present

## 2022-02-27 DIAGNOSIS — Z940 Kidney transplant status: Secondary | ICD-10-CM | POA: Diagnosis not present

## 2022-02-27 DIAGNOSIS — Z823 Family history of stroke: Secondary | ICD-10-CM | POA: Diagnosis not present

## 2022-02-27 DIAGNOSIS — I120 Hypertensive chronic kidney disease with stage 5 chronic kidney disease or end stage renal disease: Secondary | ICD-10-CM | POA: Diagnosis present

## 2022-02-27 DIAGNOSIS — J189 Pneumonia, unspecified organism: Principal | ICD-10-CM | POA: Diagnosis present

## 2022-02-27 DIAGNOSIS — Z833 Family history of diabetes mellitus: Secondary | ICD-10-CM

## 2022-02-27 LAB — COMPREHENSIVE METABOLIC PANEL
ALT: 7 U/L (ref 0–44)
AST: 20 U/L (ref 15–41)
Albumin: 2.5 g/dL — ABNORMAL LOW (ref 3.5–5.0)
Alkaline Phosphatase: 97 U/L (ref 38–126)
Anion gap: 15 (ref 5–15)
BUN: 10 mg/dL (ref 8–23)
CO2: 31 mmol/L (ref 22–32)
Calcium: 8.2 mg/dL — ABNORMAL LOW (ref 8.9–10.3)
Chloride: 94 mmol/L — ABNORMAL LOW (ref 98–111)
Creatinine, Ser: 3.67 mg/dL — ABNORMAL HIGH (ref 0.44–1.00)
GFR, Estimated: 13 mL/min — ABNORMAL LOW (ref >60)
Glucose, Bld: 101 mg/dL — ABNORMAL HIGH (ref 70–99)
Potassium: 3.3 mmol/L — ABNORMAL LOW (ref 3.5–5.1)
Sodium: 140 mmol/L (ref 135–145)
Total Bilirubin: 1.3 mg/dL — ABNORMAL HIGH (ref 0.3–1.2)
Total Protein: 7 g/dL (ref 6.5–8.1)

## 2022-02-27 LAB — CBC WITH DIFFERENTIAL/PLATELET
Abs Immature Granulocytes: 0.04 10*3/uL (ref 0.00–0.07)
Basophils Absolute: 0.1 10*3/uL (ref 0.0–0.1)
Basophils Relative: 1 %
Eosinophils Absolute: 0.2 10*3/uL (ref 0.0–0.5)
Eosinophils Relative: 3 %
HCT: 30.9 % — ABNORMAL LOW (ref 36.0–46.0)
Hemoglobin: 9.9 g/dL — ABNORMAL LOW (ref 12.0–15.0)
Immature Granulocytes: 1 %
Lymphocytes Relative: 11 %
Lymphs Abs: 0.7 10*3/uL (ref 0.7–4.0)
MCH: 31.7 pg (ref 26.0–34.0)
MCHC: 32 g/dL (ref 30.0–36.0)
MCV: 99 fL (ref 80.0–100.0)
Monocytes Absolute: 0.5 10*3/uL (ref 0.1–1.0)
Monocytes Relative: 8 %
Neutro Abs: 5.2 10*3/uL (ref 1.7–7.7)
Neutrophils Relative %: 76 %
Platelets: 193 10*3/uL (ref 150–400)
RBC: 3.12 MIL/uL — ABNORMAL LOW (ref 3.87–5.11)
RDW: 15 % (ref 11.5–15.5)
WBC: 6.8 10*3/uL (ref 4.0–10.5)
nRBC: 0 % (ref 0.0–0.2)

## 2022-02-27 LAB — LACTIC ACID, PLASMA: Lactic Acid, Venous: 1.7 mmol/L (ref 0.5–1.9)

## 2022-02-27 LAB — RESP PANEL BY RT-PCR (FLU A&B, COVID) ARPGX2
Influenza A by PCR: NEGATIVE
Influenza B by PCR: NEGATIVE
SARS Coronavirus 2 by RT PCR: NEGATIVE

## 2022-02-27 MED ORDER — GUAIFENESIN ER 600 MG PO TB12
600.0000 mg | ORAL_TABLET | Freq: Two times a day (BID) | ORAL | Status: DC
  Administered 2022-02-27 – 2022-03-02 (×6): 600 mg via ORAL
  Filled 2022-02-27 (×6): qty 1

## 2022-02-27 MED ORDER — TACROLIMUS 1 MG PO CAPS
2.0000 mg | ORAL_CAPSULE | Freq: Two times a day (BID) | ORAL | Status: DC
  Administered 2022-02-27 – 2022-03-02 (×6): 2 mg via ORAL
  Filled 2022-02-27 (×6): qty 2

## 2022-02-27 MED ORDER — ALBUTEROL SULFATE (2.5 MG/3ML) 0.083% IN NEBU: 2.5000 mg | INHALATION_SOLUTION | Freq: Four times a day (QID) | RESPIRATORY_TRACT | Status: DC | PRN

## 2022-02-27 MED ORDER — SEVELAMER CARBONATE 800 MG PO TABS
800.0000 mg | ORAL_TABLET | Freq: Three times a day (TID) | ORAL | Status: DC
  Administered 2022-02-28 – 2022-03-02 (×6): 800 mg via ORAL
  Filled 2022-02-27 (×6): qty 1

## 2022-02-27 MED ORDER — PREDNISONE 10 MG PO TABS
5.0000 mg | ORAL_TABLET | Freq: Every day | ORAL | Status: DC
  Administered 2022-02-28 – 2022-03-02 (×3): 5 mg via ORAL
  Filled 2022-02-27 (×3): qty 1

## 2022-02-27 MED ORDER — PANTOPRAZOLE SODIUM 40 MG PO TBEC: 40.0000 mg | DELAYED_RELEASE_TABLET | Freq: Every day | ORAL | Status: DC

## 2022-02-27 MED ORDER — DOXYCYCLINE HYCLATE 100 MG PO TABS
100.0000 mg | ORAL_TABLET | Freq: Two times a day (BID) | ORAL | Status: DC
  Administered 2022-02-27 – 2022-03-02 (×6): 100 mg via ORAL
  Filled 2022-02-27 (×6): qty 1

## 2022-02-27 MED ORDER — HEPARIN SODIUM (PORCINE) 5000 UNIT/ML IJ SOLN
5000.0000 [IU] | Freq: Three times a day (TID) | INTRAMUSCULAR | Status: DC
  Administered 2022-02-27 – 2022-03-02 (×8): 5000 [IU] via SUBCUTANEOUS
  Filled 2022-02-27 (×8): qty 1

## 2022-02-27 MED ORDER — SIMVASTATIN 20 MG PO TABS
20.0000 mg | ORAL_TABLET | Freq: Every evening | ORAL | Status: DC
  Administered 2022-02-27 – 2022-03-01 (×3): 20 mg via ORAL
  Filled 2022-02-27 (×3): qty 1

## 2022-02-27 MED ORDER — ONDANSETRON HCL 4 MG/2ML IJ SOLN: 4.0000 mg | Freq: Four times a day (QID) | INTRAMUSCULAR | Status: DC | PRN

## 2022-02-27 MED ORDER — ACETAMINOPHEN 325 MG PO TABS
650.0000 mg | ORAL_TABLET | Freq: Four times a day (QID) | ORAL | Status: DC | PRN
  Administered 2022-02-28: 650 mg via ORAL
  Filled 2022-02-27: qty 2

## 2022-02-27 MED ORDER — ONDANSETRON HCL 4 MG PO TABS: 4.0000 mg | ORAL_TABLET | Freq: Four times a day (QID) | ORAL | Status: DC | PRN

## 2022-02-27 MED ORDER — ASPIRIN 81 MG PO TBEC
81.0000 mg | DELAYED_RELEASE_TABLET | Freq: Every day | ORAL | Status: DC
  Administered 2022-02-28 – 2022-03-02 (×3): 81 mg via ORAL
  Filled 2022-02-27 (×3): qty 1

## 2022-02-27 MED ORDER — SODIUM CHLORIDE 0.9 % IV SOLN
2.0000 g | INTRAVENOUS | Status: DC
  Administered 2022-02-27 – 2022-03-01 (×3): 2 g via INTRAVENOUS
  Filled 2022-02-27 (×3): qty 20

## 2022-02-27 MED ORDER — SENNOSIDES-DOCUSATE SODIUM 8.6-50 MG PO TABS: 1.0000 | ORAL_TABLET | Freq: Every evening | ORAL | Status: DC | PRN

## 2022-02-27 MED ORDER — ACETAMINOPHEN 650 MG RE SUPP: 650.0000 mg | Freq: Four times a day (QID) | RECTAL | Status: DC | PRN

## 2022-02-27 NOTE — ED Notes (Signed)
Pt 96% on RA. Ambulated on RA O2 dropped to 92% with pt c/o ShOB during ambulation in the hall. Improved to 95% with deep breathing.

## 2022-02-27 NOTE — Hospital Course (Signed)
Karen [name] is a 66 y.o. female with medical history significant for ESRD (s/p failed DDKT x 2) on HD TTS, HTN, HLD, anemia of CKD, history of seizures, neutropenia who is admitted with community-acquired pneumonia.

## 2022-02-27 NOTE — ED Provider Notes (Signed)
[facility] EMERGENCY DEPARTMENT Provider Note   CSN: [id number] Arrival date & time: 02/27/22  1606     History  Chief Complaint  Patient presents with   Cough    Karen [name] is a 66 y.o. female. With past medical history of seizures, renal transplant 2011, ESRD on HD TThS, HTN who presents to the emergency department with cough.  States she has felt unwell since prior to thanksgiving. Describes having general malaise and decreased appetite. Developed a dry cough on Friday 02/23/22 which worsened on Sunday. She states she has had increased work of breathing since then. Was at dialysis today and having cough and chills. Per EMS at dialysis she received tylenol, cefepime and vancomycin. She had full treatment. She denies having chest pain, palpitations, congestion, sore throat, objective fever, abdominal pain, nausea, vomiting, diarrhea, dysuria, lightheaded or dizziness.     Cough Associated symptoms: chills and shortness of breath   Associated symptoms: no chest pain, no fever and no sore throat        Home Medications Prior to Admission medications   Medication Sig Start Date End Date Taking? Authorizing Provider  amLODipine (NORVASC) 10 MG tablet Take 10 mg by mouth daily.    [provider]  aspirin EC 81 MG tablet Take 81 mg by mouth daily.    [provider]  calcitRIOL (ROCALTROL) 0.25 MCG capsule Take 0.25 mcg by mouth every Monday, Wednesday, and Friday.    [provider]  hydrALAZINE (APRESOLINE) 100 MG tablet Take 100 mg by mouth 3 (three) times daily.    [provider]  labetalol (NORMODYNE) 300 MG tablet Take 300 mg by mouth 2 (two) times daily.    [provider]  losartan (COZAAR) 100 MG tablet Take 100 mg by mouth daily.    [provider]  magnesium oxide (MAG-OX) 400 MG tablet Take 400 mg by mouth 2 (two) times daily.     [provider]  omeprazole (PRILOSEC) 20 MG capsule  Take 20 mg by mouth daily.    [provider]  oxyCODONE-acetaminophen (PERCOCET) 5-325 MG tablet Take 1 tablet by mouth every 6 (six) hours as needed. 08/11/21   Rhyne, Hulen Shouts, PA-C  predniSONE (DELTASONE) 5 MG tablet Take 5 mg by mouth daily.    [provider]  simvastatin (ZOCOR) 20 MG tablet Take 20 mg by mouth every evening.    [provider]  tacrolimus (PROGRAF) 1 MG capsule Take 2 capsules (2 mg total) by mouth 2 (two) times daily. 06/21/21   Arrien, Jimmy Picket, MD  VENTOLIN HFA [phone] Base) MCG/ACT inhaler Inhale 1 puff into the lungs 3 times/day as needed-between meals & bedtime. 06/21/21   Arrien, Jimmy Picket, MD      Allergies    Patient has no known allergies.    Review of Systems   Review of Systems  Constitutional:  Positive for appetite change and chills. Negative for fever.  HENT:  Negative for congestion and sore throat.   Respiratory:  Positive for cough and shortness of breath.   Cardiovascular:  Negative for chest pain, palpitations and leg swelling.  Gastrointestinal:  Negative for abdominal pain, nausea and vomiting.  Genitourinary:  Negative for dysuria.  All other systems reviewed and are negative.   Physical Exam Updated Vital Signs BP 137/71 (BP Location: Left Arm)   Pulse 86   Temp 97.7 F (36.5 C) (Oral)   Resp 17   SpO2 97%  Physical Exam  Vitals and nursing note reviewed.  Constitutional:      Appearance: Normal appearance. She is normal weight. She is ill-appearing. She is not toxic-appearing.  HENT:     Head: Normocephalic.     Nose: Nose normal.     Mouth/Throat:     Mouth: Mucous membranes are moist.     Pharynx: Oropharynx is clear.  Eyes:     General: No scleral icterus.    Extraocular Movements: Extraocular movements intact.     Pupils: Pupils are equal, round, and reactive to light.  Cardiovascular:     Rate and Rhythm: Regular rhythm. Tachycardia present.     Pulses: Normal pulses.     Heart  sounds: No murmur heard. Pulmonary:     Effort: Tachypnea present.     Breath sounds: Examination of the right-middle field reveals decreased breath sounds. Examination of the right-lower field reveals decreased breath sounds. Examination of the left-lower field reveals rhonchi. Decreased breath sounds and rhonchi present.  Abdominal:     General: Bowel sounds are normal.     Palpations: Abdomen is soft.  Musculoskeletal:        General: Normal range of motion.     Cervical back: Neck supple.     Right lower leg: No edema.     Left lower leg: No edema.  Skin:    General: Skin is warm and dry.     Capillary Refill: Capillary refill takes less than 2 seconds.  Neurological:     General: No focal deficit present.     Mental Status: She is alert and oriented to person, place, and time. Mental status is at baseline.  Psychiatric:        Mood and Affect: Mood normal.        Behavior: Behavior normal.    ED Results / Procedures / Treatments   Labs (all labs ordered are listed, but only abnormal results are displayed) Labs Reviewed  COMPREHENSIVE METABOLIC PANEL - Abnormal; Notable for the following components:      Result Value   Potassium 3.3 (*)    Chloride 94 (*)    Glucose, Bld 101 (*)    Creatinine, Ser 3.67 (*)    Calcium 8.2 (*)    Albumin 2.5 (*)    Total Bilirubin 1.3 (*)    GFR, Estimated 13 (*)    All other components within normal limits  CBC WITH DIFFERENTIAL/PLATELET - Abnormal; Notable for the following components:   RBC 3.12 (*)    Hemoglobin 9.9 (*)    HCT 30.9 (*)    All other components within normal limits  RESP PANEL BY RT-PCR (FLU A&B, COVID) ARPGX2  CULTURE, BLOOD (ROUTINE X 2)  CULTURE, BLOOD (ROUTINE X 2)  LACTIC ACID, PLASMA  LACTIC ACID, PLASMA    EKG EKG Interpretation  Date/Time:  Tuesday February 27 2022 16:35:12 EST Ventricular Rate:  90 PR Interval:  134 QRS Duration: 84 QT Interval:  484 QTC Calculation: 592 R Axis:   64 Text  Interpretation: Sinus rhythm with frequent and consecutive Premature ventricular complexes and Fusion complexes T wave abnormality, consider anterior ischemia Prolonged QT Abnormal ECG No previous ECGs available Confirmed by Dene Gentry [phone]) on 02/27/2022 5:10:03 PM  Radiology DG Chest 2 View  Result Date: 02/27/2022 CLINICAL DATA:  Pneumonia.  Cough and chills.  Dialysis patient EXAM: CHEST - 2 VIEW COMPARISON:  Chest 08/11/2021 FINDINGS: Cardiac enlargement with vascular congestion. Chronic right pleural effusion and right lower lobe airspace disease similar  to the prior study. Small left effusion Subtle airspace disease in the left lung could represent edema or pneumonia. Follow-up chest x-ray recommended. IMPRESSION: 1. Cardiac enlargement with vascular congestion. 2. Chronic right pleural effusion and right lower lobe airspace disease. 3. Subtle airspace disease in the left lung may represent edema or pneumonia. Electronically Signed   By: Franchot Gallo M.D.   On: 02/27/2022 17:13    Procedures Procedures   Medications Ordered in ED Medications - No data to display  ED Course/ Medical Decision Making/ A&P Clinical Course as of 02/27/22 2020  Tue Feb 27, 2022  1858 Spoke with Dr. Hollie Salk, with nephrology who is aware pt is being admitted.  [LA]    Clinical Course User Index [LA] Mickie Hillier, PA-C                           Medical Decision Making Amount and/or Complexity of Data Reviewed Labs: ordered. Radiology: ordered.  Risk Decision regarding hospitalization.  Initial Impression and Ddx 66 year old female who presents to the emergency department with shortness of breath and cough. She is overall hemodynamically stable. She is on [facility] on my initial evaluation. She is tachypneic and has rhonchi in the LLL as well as decreased lung sounds on the left. Does not appear FVO on my exam.  Patient PMH that increases complexity of ED encounter:  ESRD on HD, HTN, seizures Ddx:  ACS, PE, pneumothorax, pleural effusion, cardiac tamponade, aortic dissection, COPD exacerbation, pneumonia, asthma exacerbation, congestive heart failure, viral upper respiratory infection, medication side effect, anaphylaxis, etc.    Interpretation of Diagnostics I independent reviewed and interpreted the labs as followed: CMP K 3.3, Cr 3.67, CBC hgb 9.9, covid/flu negative, lactic negative  - I independently visualized the following imaging with scope of interpretation limited to determining acute life threatening conditions related to emergency care: CXR, which revealed known RLL pleural effusion, new, likely LLL pneumonia  Patient Reassessment and Ultimate Disposition/Management Slightly tachypneic on eval. She has adventitious lung sounds that were suspicious for pneumonia. Obtained XR which confirms LLL pneumonia.  Symptoms inconsistent with other etiologies of SOB like tamponade or dissection, COPD or asthma. No history of CHF and she does not appear FVO on exam. Doubt ACS. She is not having chest pain. EKG without ischemic changes. CXR without pneumothorax. She does appear to have chronic right sided pleural effusion. Sure this is contributing partly to her SOB. Think pneumonia is driving the majority of her presentation.  Walked the patient with borderline O2 sats on room air. Also became quite tachypneic.  Will not initiate antibiotics at this time as she just received a dose of vanc/cefepime at 1:30.  Will admit given that she is immunosuppressed on Prograf, prednisone.   Consulted and spoke with nephrology who is aware patient is in ED and being admitted. Dr. Hollie Salk was actually nephrologist that sent patient to ED from dialysis. She notes the patient looked very ill at dialysis and was rigorous.  Consulted and spoke with Dr. Posey Pronto, hospital medicine, who agrees to admit patient.   Patient management required discussion with the following services or consulting groups:  Hospitalist  Service and Nephrology  Complexity of Problems Addressed Acute complicated illness or Injury  Additional Data Reviewed and Analyzed Further history obtained from: EMS on arrival, Past medical history and medications listed in the EMR, Prior ED visit notes, Recent PCP notes, Care Everywhere, and Prior labs/imaging results  Patient Encounter Risk Assessment  Prescriptions, SDOH impact on management, and Consideration of hospitalization  Final Clinical Impression(s) / ED Diagnoses Final diagnoses:  Community acquired pneumonia of left lower lobe of lung    Rx / DC Orders ED Discharge Orders     None         Mickie Hillier, PA-C 02/27/22 2020    Valarie Merino, MD 02/28/22 0002

## 2022-02-27 NOTE — ED Triage Notes (Signed)
Pt BIB EMS from dialysis, pt lives at home. During treatment pt c/o dry non-productive cough and chills. Dialysis center drew blood cultures and gave '650mg'$  Tylenol, Cefepime and Vancomycin. Afebrile with EMS at 97.2 pt states she spiked a fever after starting her treatment. Pt finished full dialysis session.   142/79 HR 80 100% 2LNC RR 20

## 2022-02-27 NOTE — Assessment & Plan Note (Signed)
Remote history of seizures associated with PRES not currently on anticonvulsants.

## 2022-02-27 NOTE — Assessment & Plan Note (Signed)
Continue simvastatin. 

## 2022-02-27 NOTE — H&P (Signed)
Initial vitals showed History and Physical    Danae Oland WUJ:[id number] DOB: 21-Dec-1955 DOA: 02/27/2022  PCP: Edrick Oh, MD  Patient coming from: Home  I have personally briefly reviewed patient's old medical records in [place]  Chief Complaint: Cough, chills  HPI: Annete Ayuso is a 66 y.o. female with medical history significant for ESRD (s/p failed DDKT x 2) on HD TTS, HTN, HLD, anemia of CKD, history of seizures, neutropenia who presented to the ED for evaluation of cough and chills.  Patient states that she has had a dry cough beginning about 1 week ago.  Last few days she has been having exertional dyspnea which has persisted even after her dialysis sessions.  She has not had any peripheral edema.  During her usual HD session earlier today (11/28) she developed chills, rigors and reported fever.  Per ED triage documentation, dialysis center drew blood cultures and patient was given vancomycin and cefepime.  EMS were called and temperature was 97.2, BP 142/79, HR 80, RR 20, SpO2 100% on 2 L O2 via [place].  She has not had any chest pain, nausea, vomiting, abdominal pain.  She still makes a little bit of urine and has not had any dysuria.  She is still on immunosuppressive therapy with tacrolimus and chronic prednisone 5 mg daily.  She has been taken off of CellCept.  She has not had any skin changes, rash, wounds.  ED Course  Labs/Imaging on admission: I have personally reviewed following labs and imaging studies.  Initial vitals showed BP 137/71, pulse 86, RR 17, temp 97.7 F, SpO2 97% on 2 L O2 via .  Labs show sodium 140, potassium 3.3, bicarb 31, BUN 10, creatinine 3.67, serum glucose 101, WBC 6.8, hemoglobin 9.9, platelets 193,000, lactic acid 1.7.  SARS-CoV-2 and influenza PCR negative.  2 view chest x-ray shows subtle airspace disease in the left lung, cardiac enlargement with vascular congestion, chronic right pleural effusion and right lower lobe  airspace disease.  EDP discussed with on-call nephrology.  The hospitalist service was consulted to admit for further evaluation and management.  Review of Systems: All systems reviewed and are negative except as documented in history of present illness above.   Past Medical History:  Diagnosis Date   Anemia associated with chronic renal failure    Arthritis    Convulsions/seizures ([place]) 01/28/2013   Dyslipidemia    GERD (gastroesophageal reflux disease)    Hyperlipidemia    Hypertension    Kidney transplanted    Seizure disorder ([place])    Stroke ([place])    "possible mini stroke" years ago per patient- patient no longer sees neurologist    Past Surgical History:  Procedure Laterality Date   CATHETER REMOVAL     COLONOSCOPY     GALLBLADDER SURGERY     INSERTION OF DIALYSIS CATHETER Left 08/11/2021   Procedure: INSERTION LEFT INTERNAL JUGULAR TUNNELED DIALYSIS CATHETER;  Surgeon: Marty Heck, MD;  Location: [facility];  Service: Vascular;  Laterality: Left;   KIDNEY TRANSPLANT     04/10/2009   REVISON OF ARTERIOVENOUS FISTULA Right 08/11/2021   Procedure: RIGHT ARM ARTERIOVENOUS FISTULA REVISION WITH ANEURYSM REMOVAL;  Surgeon: Marty Heck, MD;  Location: [facility];  Service: Vascular;  Laterality: Right;    Social History:  reports that she has quit smoking. Her smoking use included cigarettes. She has never been exposed to tobacco smoke. She has never used smokeless tobacco. She reports that she does not drink alcohol and does  not use drugs.  No Known Allergies  Family History  Problem Relation Age of Onset   Stroke Mother    Seizures Mother    Migraines Mother    Diabetes Brother    Colon cancer Neg Hx    Esophageal cancer Neg Hx    Stomach cancer Neg Hx    Rectal cancer Neg Hx      Prior to Admission medications   Medication Sig Start Date End Date Taking? Authorizing Provider  amLODipine (NORVASC) 10 MG tablet Take 10 mg by mouth daily.    [provider]  aspirin EC 81 MG tablet Take 81 mg by mouth daily.    [provider]  calcitRIOL (ROCALTROL) 0.25 MCG capsule Take 0.25 mcg by mouth every Monday, Wednesday, and Friday.    [provider]  hydrALAZINE (APRESOLINE) 100 MG tablet Take 100 mg by mouth 3 (three) times daily.    [provider]  labetalol (NORMODYNE) 300 MG tablet Take 300 mg by mouth 2 (two) times daily.    [provider]  losartan (COZAAR) 100 MG tablet Take 100 mg by mouth daily.    [provider]  magnesium oxide (MAG-OX) 400 MG tablet Take 400 mg by mouth 2 (two) times daily.     [provider]  omeprazole (PRILOSEC) 20 MG capsule Take 20 mg by mouth daily.    [provider]  oxyCODONE-acetaminophen (PERCOCET) 5-325 MG tablet Take 1 tablet by mouth every 6 (six) hours as needed. 08/11/21   Rhyne, Hulen Shouts, PA-C  predniSONE (DELTASONE) 5 MG tablet Take 5 mg by mouth daily.    [provider]  simvastatin (ZOCOR) 20 MG tablet Take 20 mg by mouth every evening.    [provider]  tacrolimus (PROGRAF) 1 MG capsule Take 2 capsules (2 mg total) by mouth 2 (two) times daily. 06/21/21   Arrien, Jimmy Picket, MD  VENTOLIN HFA 108 [phone] Base) MCG/ACT inhaler Inhale 1 puff into the lungs 3 times/day as needed-between meals & bedtime. 06/21/21   Arrien, Jimmy Picket, MD    Physical Exam: Vitals:   02/27/22 1607 02/27/22 2102  BP: 137/71 (!) 157/70  Pulse: 86 82  Resp: 17 17  Temp: 97.7 F (36.5 C) (!) 97.5 F (36.4 C)  TempSrc: Oral Oral  SpO2: 97% 99%   Constitutional: Resting in bed with head elevated, NAD, calm, comfortable Eyes: EOMI, lids and conjunctivae normal ENMT: Mucous membranes are moist. Posterior pharynx clear of any exudate or lesions.Normal dentition.  Neck: normal, supple, no masses. Respiratory: clear to auscultation bilaterally, no wheezing, no crackles. Normal respiratory effort. No accessory muscle use.   Cardiovascular: Regular rate and rhythm, no murmurs / rubs / gallops. No extremity edema. 2+ pedal pulses.  RUE AVF with palpable thrill. Abdomen: no tenderness, no masses palpated. Musculoskeletal: no clubbing / cyanosis. No joint deformity upper and lower extremities. Good ROM, no contractures. Normal muscle tone.  Skin: no rashes, lesions, ulcers. No induration Neurologic: CN 2-12 grossly intact. Sensation intact. Strength 5/5 in all 4.  Psychiatric: Normal judgment and insight. Alert and oriented x 3. Normal mood.   EKG: Personally reviewed. Sinus rhythm, frequent PVCs, QTc 592.  PVCs and QT prolongation new when compared to prior.  Assessment/Plan Principal Problem:   Community acquired pneumonia of left lung Active Problems:   ESRD on hemodialysis ([place])   Hypertension   History of seizure   Renal transplant recipient   Dyslipidemia   Robbyn Hodgdon is  a 66 y.o. female with medical history significant for ESRD (s/p failed DDKT x 2) on HD TTS, HTN, HLD, anemia of CKD, history of seizures, neutropenia who is admitted with community-acquired pneumonia.  Assessment and Plan: * Community acquired pneumonia of left lung Presenting from dialysis center with cough, chills, dyspnea and reported fever at HD center.  CXR suggestive of left-sided pneumonia also shows chronic right pleural effusion and right lower lobe airspace disease.  Patient with apparent relative leukocytosis, neutropenic at baseline. -Continue ceftriaxone and doxycycline -Follow blood cultures -COVID and flu negative -Supplemental O2 as needed  ESRD on hemodialysis ([place]) S/p DDKT x 2 on chronic immunosuppressants On dialysis TTS schedule and completed HD 11/28. Nephrology aware. -Continue usual HD per nephrology next due 11/30 -Will plan to resume home Prograf and prednisone tomorrow  Hypertension BP stable, only taking antihypertensives on an as-needed basis at home per med  reconciliation.  Dyslipidemia Continue simvastatin.  History of seizure Remote history of seizures associated with PRES not currently on anticonvulsants.  DVT prophylaxis: heparin injection 5,000 Units Start: 02/27/22 2200 Code Status: Full code, confirmed with patient on admission Family Communication: Brother at bedside Disposition Plan: From home and likely discharge to home pending clinical progress Consults called: Nephrology Severity of Illness: The appropriate patient status for this patient is INPATIENT. Inpatient status is judged to be reasonable and necessary in order to provide the required intensity of service to ensure the patient's safety. The patient's presenting symptoms, physical exam findings, and initial radiographic and laboratory data in the context of their chronic comorbidities is felt to place them at high risk for further clinical deterioration. Furthermore, it is not anticipated that the patient will be medically stable for discharge from the hospital within 2 midnights of admission.   * I certify that at the point of admission it is my clinical judgment that the patient will require inpatient hospital care spanning beyond 2 midnights from the point of admission due to high intensity of service, high risk for further deterioration and high frequency of surveillance required.Zada Finders MD [facility]  If 7PM-7AM, please contact night-coverage [email or web address]  02/27/2022, 9:48 PM

## 2022-02-27 NOTE — Assessment & Plan Note (Addendum)
Presenting from dialysis center with cough, chills, dyspnea and reported fever at HD center.  CXR suggestive of left-sided pneumonia also shows chronic right pleural effusion and right lower lobe airspace disease.  Patient with apparent relative leukocytosis, neutropenic at baseline. -Continue ceftriaxone and doxycycline -Follow blood cultures -COVID and flu negative -Supplemental O2 as needed

## 2022-02-27 NOTE — Assessment & Plan Note (Signed)
BP stable, only taking antihypertensives on an as-needed basis at home per med reconciliation.

## 2022-02-27 NOTE — Assessment & Plan Note (Signed)
S/p DDKT x 2 on chronic immunosuppressants On dialysis TTS schedule and completed HD 11/28. Nephrology aware. -Continue usual HD per nephrology next due 11/30 -Will plan to resume home Prograf and prednisone tomorrow

## 2022-02-28 DIAGNOSIS — J189 Pneumonia, unspecified organism: Secondary | ICD-10-CM | POA: Diagnosis not present

## 2022-02-28 DIAGNOSIS — Z87898 Personal history of other specified conditions: Secondary | ICD-10-CM

## 2022-02-28 DIAGNOSIS — I10 Essential (primary) hypertension: Secondary | ICD-10-CM

## 2022-02-28 DIAGNOSIS — Z992 Dependence on renal dialysis: Secondary | ICD-10-CM

## 2022-02-28 DIAGNOSIS — N186 End stage renal disease: Secondary | ICD-10-CM | POA: Diagnosis not present

## 2022-02-28 LAB — BASIC METABOLIC PANEL
Anion gap: 13 (ref 5–15)
BUN: 16 mg/dL (ref 8–23)
CO2: 30 mmol/L (ref 22–32)
Calcium: 8.1 mg/dL — ABNORMAL LOW (ref 8.9–10.3)
Chloride: 96 mmol/L — ABNORMAL LOW (ref 98–111)
Creatinine, Ser: 4.36 mg/dL — ABNORMAL HIGH (ref 0.44–1.00)
GFR, Estimated: 11 mL/min — ABNORMAL LOW (ref >60)
Glucose, Bld: 84 mg/dL (ref 70–99)
Potassium: 4.4 mmol/L (ref 3.5–5.1)
Sodium: 139 mmol/L (ref 135–145)

## 2022-02-28 LAB — CBC
HCT: 30.2 % — ABNORMAL LOW (ref 36.0–46.0)
Hemoglobin: 9.8 g/dL — ABNORMAL LOW (ref 12.0–15.0)
MCH: 32.3 pg (ref 26.0–34.0)
MCHC: 32.5 g/dL (ref 30.0–36.0)
MCV: 99.7 fL (ref 80.0–100.0)
Platelets: 231 10*3/uL (ref 150–400)
RBC: 3.03 MIL/uL — ABNORMAL LOW (ref 3.87–5.11)
RDW: 15.2 % (ref 11.5–15.5)
WBC: 6.4 10*3/uL (ref 4.0–10.5)
nRBC: 0 % (ref 0.0–0.2)

## 2022-02-28 LAB — HEPATITIS B SURFACE ANTIGEN: Hepatitis B Surface Ag: NONREACTIVE

## 2022-02-28 MED ORDER — CHLORHEXIDINE GLUCONATE CLOTH 2 % EX PADS
6.0000 | MEDICATED_PAD | Freq: Every day | CUTANEOUS | Status: DC
  Administered 2022-03-01 – 2022-03-02 (×2): 6 via TOPICAL

## 2022-02-28 MED ORDER — HYDRALAZINE HCL 20 MG/ML IJ SOLN: 10.0000 mg | Freq: Four times a day (QID) | INTRAMUSCULAR | Status: DC | PRN

## 2022-02-28 MED ORDER — LOSARTAN POTASSIUM 50 MG PO TABS
50.0000 mg | ORAL_TABLET | Freq: Every day | ORAL | Status: DC
  Administered 2022-02-28 – 2022-03-02 (×2): 50 mg via ORAL
  Filled 2022-02-28 (×2): qty 1

## 2022-02-28 MED ORDER — PANTOPRAZOLE SODIUM 40 MG PO TBEC
40.0000 mg | DELAYED_RELEASE_TABLET | Freq: Every day | ORAL | Status: DC
  Administered 2022-02-28 – 2022-03-02 (×4): 40 mg via ORAL
  Filled 2022-02-28 (×4): qty 1

## 2022-02-28 NOTE — Consult Note (Signed)
Renal Service Consult Note [facility]  Mackensey Bolte 02/28/2022 Sol Blazing, MD Requesting Physician: Dr. Sloan Leiter, Chauncey Cruel.   Reason for Consult: ESRD pt w/ pneumonia HPI: The patient is a 66 y.o. year-old w/ PMH as below presented to ED last night due cough and chills that started after her dialysis session yesterday. Blood cx's were drawn at the HD unit, results pending. In ED vitals were stable, RR 20, HR 80 and SpO2 100% on 2 L O2 [place].  CXR showed RLL opacity/ effusion. Pt admitted and started on IV abx. Asked to see for dialysis.   Pt seen in room. Pt had renal transplant for 13 yrs from 2010 --> march 2023 when she had to go back on dialysis. No HD complaints, good compliance.   ROS - denies CP, no joint pain, no HA, no blurry vision, no rash, no diarrhea, no nausea/ vomiting, no dysuria, no difficulty voiding   Past Medical History  Past Medical History:  Diagnosis Date   Anemia associated with chronic renal failure    Arthritis    Convulsions/seizures ([place]) 01/28/2013   Dyslipidemia    GERD (gastroesophageal reflux disease)    Hyperlipidemia    Hypertension    Kidney transplanted    Seizure disorder ([place])    Stroke ([place])    "possible mini stroke" years ago per patient- patient no longer sees neurologist   Past Surgical History  Past Surgical History:  Procedure Laterality Date   CATHETER REMOVAL     COLONOSCOPY     GALLBLADDER SURGERY     INSERTION OF DIALYSIS CATHETER Left 08/11/2021   Procedure: INSERTION LEFT INTERNAL JUGULAR TUNNELED DIALYSIS CATHETER;  Surgeon: Marty Heck, MD;  Location: [facility];  Service: Vascular;  Laterality: Left;   KIDNEY TRANSPLANT     04/10/2009   REVISON OF ARTERIOVENOUS FISTULA Right 08/11/2021   Procedure: RIGHT ARM ARTERIOVENOUS FISTULA REVISION WITH ANEURYSM REMOVAL;  Surgeon: Marty Heck, MD;  Location: [facility];  Service: Vascular;  Laterality: Right;   Family History  Family History  Problem  Relation Age of Onset   Stroke Mother    Seizures Mother    Migraines Mother    Diabetes Brother    Colon cancer Neg Hx    Esophageal cancer Neg Hx    Stomach cancer Neg Hx    Rectal cancer Neg Hx    Social History  reports that she has quit smoking. Her smoking use included cigarettes. She has never been exposed to tobacco smoke. She has never used smokeless tobacco. She reports that she does not drink alcohol and does not use drugs. Allergies No Known Allergies Home medications Prior to Admission medications   Medication Sig Start Date End Date Taking? Authorizing Provider  aspirin EC 81 MG tablet Take 81 mg by mouth daily.   Yes [provider]  calcitRIOL (ROCALTROL) 0.25 MCG capsule Take 0.25 mcg by mouth every Monday, Wednesday, and Friday.   Yes [provider]  hydrALAZINE (APRESOLINE) 100 MG tablet Take 100 mg by mouth daily as needed (for systolic blood pressure of 157 and above).   Yes [provider]  labetalol (NORMODYNE) 300 MG tablet Take 300 mg by mouth daily as needed (for systolic blood pressur of 157 and above).   Yes [provider]  losartan (COZAAR) 100 MG tablet Take 100 mg by mouth daily as needed (for systolic blood pressure of 157 and above).   Yes [provider]  magnesium oxide (MAG-OX)  400 MG tablet Take 400 mg by mouth 2 (two) times daily.    Yes [provider]  omeprazole (PRILOSEC) 20 MG capsule Take 20 mg by mouth daily.   Yes [provider]  predniSONE (DELTASONE) 5 MG tablet Take 5 mg by mouth daily.   Yes [provider]  sevelamer carbonate (RENVELA) 800 MG tablet Take 800 mg by mouth 3 (three) times daily with meals. Take 1 tablet with snacks 02/21/22  Yes [provider]  simvastatin (ZOCOR) 20 MG tablet Take 20 mg by mouth every evening.   Yes [provider]  tacrolimus (PROGRAF) 1 MG capsule Take 2 capsules (2 mg total) by mouth 2 (two) times daily. 06/21/21   Yes Arrien, Jimmy Picket, MD  VENTOLIN HFA 108 [phone] Base) MCG/ACT inhaler Inhale 1 puff into the lungs 3 times/day as needed-between meals & bedtime. 06/21/21  Yes Arrien, Jimmy Picket, MD  amLODipine (NORVASC) 10 MG tablet Take 10 mg by mouth daily. Patient not taking: Reported on 02/27/2022    [provider]  oxyCODONE-acetaminophen (PERCOCET) 5-325 MG tablet Take 1 tablet by mouth every 6 (six) hours as needed. 08/11/21   Gabriel Earing, PA-C     Vitals:   02/28/22 0144 02/28/22 0344 02/28/22 0802 02/28/22 1100  BP: (!) 163/78 (!) 166/79 (!) 155/94   Pulse: 84 78 86   Resp: '17 18 18   '$ Temp: 97.7 F (36.5 C) 97.6 F (36.4 C) (!) 97.4 F (36.3 C)   TempSrc: Oral Oral Oral   SpO2: 99% 100% 98%   Weight:    55.4 kg   Exam Gen alert, no distress No rash, cyanosis or gangrene Sclera anicteric, throat clear  No jvd or bruits Chest clear bilat to bases, no rales/ wheezing RRR no MRG Abd soft ntnd no mass or ascites +bs GU defer  MS no joint effusions or deformity Ext no LE or UE edema, no wounds or ulcers Neuro is alert, Ox 3 , nf    RUA AVF+bruit   Home meds include - aspirin, hydralazine 100 prn, labetalol 300 prn, losartan 100 prn, prilosec, prednisone 5 qd, renvela 1 ac tid, zocor, prograf '2mg'$  bid, amlodipine 10 qd, percocet prn, prns/ vits/ supps     OP HD: [place] TTS 4h  400/ 600  55.2kg  2/2 bath  AVF  Hep 2400 - last HD 11/28, post wt 55.7kg - hectorol 1 ug IV tiw - venofer '100mg'$  tiw through 12/09 - mircera 100 q2, last 11/25   Assessment/ Plan: CAP - w/ chills and fever. Getting IV abx here per pmd. Stable today.  ESRD - on HD TTS.  Had OP HD yesterday. Plan HD tomorrow.  HTN/ volume - BP's slightly up, has several "prn" meds she uses at home. No vol excess on exam. At dry wt.  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 Last esa was 4 days ago at op unit MBD ckd - CCa in range, cont IV vdra  and renvela as binder.       Rob Yovani Cogburn  MD 02/28/2022, 4:25 PM Recent Labs  Lab 02/27/22 1647 02/28/22 0015  HGB 9.9* 9.8*  ALBUMIN 2.5*  --   CALCIUM 8.2* 8.1*  CREATININE 3.67* 4.36*  K 3.3* 4.4   Inpatient medications:  aspirin EC  81 mg Oral Daily   doxycycline  100 mg Oral Q12H   guaiFENesin  600 mg Oral BID  heparin  5,000 Units Subcutaneous Q8H   losartan  50 mg Oral Daily   pantoprazole  40 mg Oral Daily   predniSONE  5 mg Oral Daily   sevelamer carbonate  800 mg Oral TID WC   simvastatin  20 mg Oral QPM   tacrolimus  2 mg Oral BID    cefTRIAXone (ROCEPHIN)  IV Stopped (02/27/22 2216)   acetaminophen **OR** acetaminophen, albuterol, hydrALAZINE, ondansetron **OR** ondansetron (ZOFRAN) IV, senna-docusate

## 2022-02-28 NOTE — ED Notes (Signed)
ED TO INPATIENT HANDOFF REPORT  ED Nurse Name and Phone #: Stanton Kidney, RN  S Name/Age/Gender Karen [name] 66 y.o. female Room/Bed: 008C/008C  Code Status   Code Status: Full Code  Home/SNF/Other Home Patient oriented to: self, place, time, and situation Is this baseline? Yes   Triage Complete: Triage complete  Chief Complaint Community acquired pneumonia of left lung [J18.9]  Triage Note Pt BIB EMS from dialysis, pt lives at home. During treatment pt c/o dry non-productive cough and chills. Dialysis center drew blood cultures and gave '650mg'$  Tylenol, Cefepime and Vancomycin. Afebrile with EMS at 97.2 pt states she spiked a fever after starting her treatment. Pt finished full dialysis session.   142/79 HR 80 100% 2LNC RR 20   Allergies No Known Allergies  Level of Care/Admitting Diagnosis ED Disposition     ED Disposition  Admit   Condition  --   Comment  Hospital Area: [facility] [[id number]]  Level of Care: Med-Surg [16]  May admit patient to [facility] or [facility] if equivalent level of care is available:: No  Covid Evaluation: Confirmed COVID Negative  Diagnosis: Community acquired pneumonia of left lung [[id number]]  Admitting Physician: Lenore Cordia [[id number]]  Attending Physician: Lenore Cordia [[id number]]  Certification:: I certify this patient will need inpatient services for at least 2 midnights  Estimated Length of Stay: 2          B Medical/Surgery History Past Medical History:  Diagnosis Date   Anemia associated with chronic renal failure    Arthritis    Convulsions/seizures ([place]) 01/28/2013   Dyslipidemia    GERD (gastroesophageal reflux disease)    Hyperlipidemia    Hypertension    Kidney transplanted    Seizure disorder ([place])    Stroke ([place])    "possible mini stroke" years ago per patient- patient no longer sees neurologist   Past Surgical History:  Procedure Laterality Date   CATHETER REMOVAL     COLONOSCOPY      GALLBLADDER SURGERY     INSERTION OF DIALYSIS CATHETER Left 08/11/2021   Procedure: INSERTION LEFT INTERNAL JUGULAR TUNNELED DIALYSIS CATHETER;  Surgeon: Marty Heck, MD;  Location: [facility];  Service: Vascular;  Laterality: Left;   KIDNEY TRANSPLANT     04/10/2009   REVISON OF ARTERIOVENOUS FISTULA Right 08/11/2021   Procedure: RIGHT ARM ARTERIOVENOUS FISTULA REVISION WITH ANEURYSM REMOVAL;  Surgeon: Marty Heck, MD;  Location: [facility];  Service: Vascular;  Laterality: Right;     A IV Location/Drains/Wounds Patient Lines/Drains/Airways Status     Active Line/Drains/Airways     Name Placement date Placement time Site Days   Peripheral IV 02/27/22 20 G Left Antecubital 02/27/22  1700  Antecubital  1   Fistula / Graft Right Upper arm Arteriovenous fistula 06/15/21  2000  Upper arm  258   Hemodialysis Catheter Left Internal jugular Double lumen Permanent (Tunneled) 08/11/21  0809  Internal jugular  201   External Urinary Catheter 06/15/21  0940  --  258   Incision (Closed) 08/11/21 Neck Right 08/11/21  0849  -- 201   Incision (Closed) 08/11/21 Arm Right 08/11/21  0849  -- 201            Intake/Output Last 24 hours  Intake/Output Summary (Last 24 hours) at 02/28/2022 0103 Last data filed at 02/27/2022 2216 Gross per 24 hour  Intake 100 ml  Output --  Net 100 ml    Labs/Imaging Results for orders placed or performed  during the hospital encounter of 02/27/22 (from the past 48 hour(s))  Comprehensive metabolic panel     Status: Abnormal   Collection Time: 02/27/22  4:47 PM  Result Value Ref Range   Sodium 140 135 - 145 mmol/L   Potassium 3.3 (L) 3.5 - 5.1 mmol/L   Chloride 94 (L) 98 - 111 mmol/L   CO2 31 22 - 32 mmol/L   Glucose, Bld 101 (H) 70 - 99 mg/dL    Comment: Glucose reference range applies only to samples taken after fasting for at least 8 hours.   BUN 10 8 - 23 mg/dL   Creatinine, Ser 3.67 (H) 0.44 - 1.00 mg/dL   Calcium 8.2 (L) 8.9 - 10.3 mg/dL    Total Protein 7.0 6.5 - 8.1 g/dL   Albumin 2.5 (L) 3.5 - 5.0 g/dL   AST 20 15 - 41 U/L   ALT 7 0 - 44 U/L   Alkaline Phosphatase 97 38 - 126 U/L   Total Bilirubin 1.3 (H) 0.3 - 1.2 mg/dL   GFR, Estimated 13 (L) >60 mL/min    Comment: (NOTE) Calculated using the CKD-EPI Creatinine Equation (2021)    Anion gap 15 5 - 15    Comment: Performed at [facility], [place] [address], [place], [place] [address]  CBC with Differential     Status: Abnormal   Collection Time: 02/27/22  4:47 PM  Result Value Ref Range   WBC 6.8 4.0 - 10.5 K/uL   RBC 3.12 (L) 3.87 - 5.11 MIL/uL   Hemoglobin 9.9 (L) 12.0 - 15.0 g/dL   HCT 30.9 (L) 36.0 - 46.0 %   MCV 99.0 80.0 - 100.0 fL   MCH 31.7 26.0 - 34.0 pg   MCHC 32.0 30.0 - 36.0 g/dL   RDW 15.0 11.5 - 15.5 %   Platelets 193 150 - 400 K/uL   nRBC 0.0 0.0 - 0.2 %   Neutrophils Relative % 76 %   Neutro Abs 5.2 1.7 - 7.7 K/uL   Lymphocytes Relative 11 %   Lymphs Abs 0.7 0.7 - 4.0 K/uL   Monocytes Relative 8 %   Monocytes Absolute 0.5 0.1 - 1.0 K/uL   Eosinophils Relative 3 %   Eosinophils Absolute 0.2 0.0 - 0.5 K/uL   Basophils Relative 1 %   Basophils Absolute 0.1 0.0 - 0.1 K/uL   Immature Granulocytes 1 %   Abs Immature Granulocytes 0.04 0.00 - 0.07 K/uL    Comment: Performed at [facility] [address], [place], [place] [address]  Resp Panel by RT-PCR (Flu A&B, Covid) Anterior Nasal Swab     Status: None   Collection Time: 02/27/22  4:47 PM   Specimen: Anterior Nasal Swab  Result Value Ref Range   SARS Coronavirus 2 by RT PCR NEGATIVE NEGATIVE    Comment: (NOTE) SARS-CoV-2 target nucleic acids are NOT DETECTED.  The SARS-CoV-2 RNA is generally detectable in upper respiratory specimens during the acute phase of infection. The lowest concentration of SARS-CoV-2 viral copies this assay can detect is 138 copies/mL. A negative result does not preclude SARS-Cov-2 infection and should not be used as the sole basis for treatment  or other patient management decisions. A negative result may occur with  improper specimen collection/handling, submission of specimen other than nasopharyngeal swab, presence of viral mutation(s) within the areas targeted by this assay, and inadequate number of viral copies(<138 copies/mL). A negative result must be combined with clinical observations, patient history, and  epidemiological information. The expected result is Negative.  Fact Sheet for Patients:  EntrepreneurPulse.com.au  Fact Sheet for Healthcare Providers:  IncredibleEmployment.be  This test is no t yet approved or cleared by the [place] FDA and  has been authorized for detection and/or diagnosis of SARS-CoV-2 by FDA under an Emergency Use Authorization (EUA). This EUA will remain  in effect (meaning this test can be used) for the duration of the COVID-19 declaration under Section 564(b)(1) of the Act, 21 U.S.C.section 360bbb-3(b)(1), unless the authorization is terminated  or revoked sooner.       Influenza A by PCR NEGATIVE NEGATIVE   Influenza B by PCR NEGATIVE NEGATIVE    Comment: (NOTE) The Xpert Xpress SARS-CoV-2/FLU/RSV plus assay is intended as an aid in the diagnosis of influenza from Nasopharyngeal swab specimens and should not be used as a sole basis for treatment. Nasal washings and aspirates are unacceptable for Xpert Xpress SARS-CoV-2/FLU/RSV testing.  Fact Sheet for Patients: EntrepreneurPulse.com.au  Fact Sheet for Healthcare Providers: IncredibleEmployment.be  This test is not yet approved or cleared by the [place] FDA and has been authorized for detection and/or diagnosis of SARS-CoV-2 by FDA under an Emergency Use Authorization (EUA). This EUA will remain in effect (meaning this test can be used) for the duration of the COVID-19 declaration under Section 564(b)(1) of the Act, 21 U.S.C. section  360bbb-3(b)(1), unless the authorization is terminated or revoked.  Performed at [facility], [place] [address] [place], [place] [address]   Lactic acid, plasma     Status: None   Collection Time: 02/27/22  5:50 PM  Result Value Ref Range   Lactic Acid, Venous 1.7 0.5 - 1.9 mmol/L    Comment: Performed at [facility] [address] [place], [place] [address]  CBC     Status: Abnormal   Collection Time: 02/28/22 12:15 AM  Result Value Ref Range   WBC 6.4 4.0 - 10.5 K/uL   RBC 3.03 (L) 3.87 - 5.11 MIL/uL   Hemoglobin 9.8 (L) 12.0 - 15.0 g/dL   HCT 30.2 (L) 36.0 - 46.0 %   MCV 99.7 80.0 - 100.0 fL   MCH 32.3 26.0 - 34.0 pg   MCHC 32.5 30.0 - 36.0 g/dL   RDW 15.2 11.5 - 15.5 %   Platelets 231 150 - 400 K/uL   nRBC 0.0 0.0 - 0.2 %    Comment: Performed at [facility], [place] [address], [place], [place] [address]  Basic metabolic panel     Status: Abnormal   Collection Time: 02/28/22 12:15 AM  Result Value Ref Range   Sodium 139 135 - 145 mmol/L   Potassium 4.4 3.5 - 5.1 mmol/L    Comment: HEMOLYSIS AT THIS LEVEL MAY AFFECT RESULT   Chloride 96 (L) 98 - 111 mmol/L   CO2 30 22 - 32 mmol/L   Glucose, Bld 84 70 - 99 mg/dL    Comment: Glucose reference range applies only to samples taken after fasting for at least 8 hours.   BUN 16 8 - 23 mg/dL   Creatinine, Ser 4.36 (H) 0.44 - 1.00 mg/dL   Calcium 8.1 (L) 8.9 - 10.3 mg/dL   GFR, Estimated 11 (L) >60 mL/min    Comment: (NOTE) Calculated using the CKD-EPI Creatinine Equation (2021)    Anion gap 13 5 - 15    Comment: Performed at [facility] [address], [place], [place] [address]   DG Chest 2  View  Result Date: 02/27/2022 CLINICAL DATA:  Pneumonia.  Cough and chills.  Dialysis patient EXAM: CHEST - 2 VIEW COMPARISON:  Chest 08/11/2021 FINDINGS: Cardiac enlargement with vascular congestion. Chronic right pleural effusion and right lower lobe airspace disease similar to the prior study. Small left  effusion Subtle airspace disease in the left lung could represent edema or pneumonia. Follow-up chest x-ray recommended. IMPRESSION: 1. Cardiac enlargement with vascular congestion. 2. Chronic right pleural effusion and right lower lobe airspace disease. 3. Subtle airspace disease in the left lung may represent edema or pneumonia. Electronically Signed   By: Franchot Gallo M.D.   On: 02/27/2022 17:13    Pending Labs Unresulted Labs (From admission, onward)     Start     Ordered   02/27/22 2052  Legionella Pneumophila Serogp 1 Ur Ag  (COPD / Pneumonia / Cellulitis / Lower Extremity Wound)  Once,   R        02/27/22 2053   02/27/22 2051  Strep pneumoniae urinary antigen  (COPD / Pneumonia / Cellulitis / Lower Extremity Wound)  Once,   R        02/27/22 2053   02/27/22 1733  Culture, blood (routine x 2)  BLOOD CULTURE X 2,   R     Question:  Patient immune status  Answer:  Immunocompromised   02/27/22 1732            Vitals/Pain Today's Vitals   02/27/22 2330 02/28/22 0000 02/28/22 0100 02/28/22 0102  BP: (!) 163/72 137/80 (!) 153/80   Pulse: 81 80 82   Resp: '15 17 16   '$ Temp:    98 F (36.7 C)  TempSrc:    Oral  SpO2: 99% 100% 99%   PainSc:        Isolation Precautions No active isolations  Medications Medications  doxycycline (VIBRA-TABS) tablet 100 mg (100 mg Oral Given 02/27/22 2152)  cefTRIAXone (ROCEPHIN) 2 g in sodium chloride 0.9 % 100 mL IVPB (0 g Intravenous Stopped 02/27/22 2216)  heparin injection 5,000 Units (5,000 Units Subcutaneous Given 02/27/22 2225)  acetaminophen (TYLENOL) tablet 650 mg (650 mg Oral Given 02/28/22 0059)    Or  acetaminophen (TYLENOL) suppository 650 mg ( Rectal See Alternative 02/28/22 0059)  ondansetron (ZOFRAN) tablet 4 mg (has no administration in time range)    Or  ondansetron (ZOFRAN) injection 4 mg (has no administration in time range)  senna-docusate (Senokot-S) tablet 1 tablet (has no administration in time range)  guaiFENesin  (MUCINEX) 12 hr tablet 600 mg (600 mg Oral Given 02/27/22 2152)  aspirin EC tablet 81 mg (has no administration in time range)  predniSONE (DELTASONE) tablet 5 mg (has no administration in time range)  sevelamer carbonate (RENVELA) tablet 800 mg (has no administration in time range)  simvastatin (ZOCOR) tablet 20 mg (20 mg Oral Given 02/27/22 2225)  tacrolimus (PROGRAF) capsule 2 mg (2 mg Oral Given 02/27/22 2225)  albuterol (PROVENTIL) (2.5 MG/3ML) 0.083% nebulizer solution 2.5 mg (has no administration in time range)  pantoprazole (PROTONIX) EC tablet 40 mg (40 mg Oral Given 02/28/22 0028)    Mobility walks with person assist Low fall risk   Focused Assessments Neuro Assessment Handoff:  Swallow screen pass? Yes          Neuro Assessment:   Neuro Checks:      Last Documented NIHSS Modified Score:   Has TPA been given? No If patient is a Neuro Trauma and patient is going to OR before floor  call report to [place] nurse: [phone] or [phone]   R Recommendations: See Admitting Provider Note  Report given to:   Additional Notes: pt is AAOx4. Pt is on 2L [place].

## 2022-02-28 NOTE — Progress Notes (Signed)
Mobility Specialist Progress Note:   02/28/22 1341  Mobility  Activity Ambulated with assistance in hallway  Level of Assistance Standby assist, set-up cues, supervision of patient - no hands on  Assistive Device Other (Comment) (Hall rails)  Distance Ambulated (ft) 150 ft  Activity Response Tolerated well  Mobility Referral Yes  $Mobility charge 1 Mobility   Pt received sitting EOB and agreeable. No complaints. Pt left sitting EOB with all needs met and call bell in reach.   Andrey Campanile Mobility Specialist Please contact via SecureChat or  Rehab office at [phone]

## 2022-02-28 NOTE — Progress Notes (Signed)
Pt arrived to unit, informed of POC, all needs addressed

## 2022-02-28 NOTE — Progress Notes (Addendum)
PROGRESS NOTE        PATIENT DETAILS Name: Karen [name] Age: 66 y.o. Sex: female Date of Birth: Sep 30, 1955 Admit Date: 02/27/2022 Admitting Physician Lenore Cordia, MD EUM:PNTI, Hassell Done, MD  Brief Summary: Patient is a 66 y.o.  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  Significant events: 11/28>> admit to [facility]-possible PNA.  Significant studies: 11/28>> CXR: Subtle airspace disease in the left lung.  Significant microbiology data: 11/28>> COVID/influenza PCR: Negative 11/28>> blood cultures: Negative 11/29>> blood cultures: Pending  Procedures: None  Consults: Nephrology  Subjective: Lying comfortably in bed-denies any chest pain or shortness of breath.  Objective: Vitals: Blood pressure (!) 155/94, pulse 86, temperature (!) 97.4 F (36.3 C), temperature source Oral, resp. rate 18, SpO2 98 %.   Exam: Gen Exam:Alert awake-not in any distress HEENT:atraumatic, normocephalic Chest: B/L clear to auscultation anteriorly CVS:S1S2 regular Abdomen:soft non tender, non distended Extremities:no edema Neurology: Non focal Skin: no rash  Pertinent Labs/Radiology:    Latest Ref Rng & Units 02/28/2022   12:15 AM 02/27/2022    4:47 PM 08/11/2021    6:11 AM  CBC  WBC 4.0 - 10.5 K/uL 6.4  6.8    Hemoglobin 12.0 - 15.0 g/dL 9.8  9.9  13.6   Hematocrit 36.0 - 46.0 % 30.2  30.9  40.0   Platelets 150 - 400 K/uL 231  193      Lab Results  Component Value Date   NA 139 02/28/2022   K 4.4 02/28/2022   CL 96 (L) 02/28/2022   CO2 30 02/28/2022      Assessment/Plan: PNA Immunocompromised status-s/p DDKT x 2 on chronic immunosuppressive's Nontoxic-appearing No leukocytosis this morning Afebrile overnight Feels somewhat better-still with a dry cough CXR with subtle left PNA-although has what appears to be a chronic  appearing pleural effusion on the right. Continue antibiotics for now-suspect bacterial PNA at this point-however if febrile in spite of antibiotics-will CT chest. Follow cultures (will reach out to renal to see if they can follow cultures done at outpatient HD-before Abx given)  ESRD on HD TTS S/p DDKT x 2-remains on immunosuppressive's Dialysis per renal On Prograf/prednisone  Normocytic anemia Due to ESRD/CKD Aranesp/iron defer to nephrology service  HTN Per patient-she watches her blood pressure very closely and takes her antihypertensives as needed Will restart losartan-hold labetalol/hydralazine Follow and optimize accordingly  HLD Statin  History of seizure Not on any AEDs Apparently had a provoked seizure due to PRES.  BMI: Estimated body mass index is 22.64 kg/m as calculated from the following:   Height as of 08/30/21: 5' (1.524 m).   Weight as of 08/30/21: 52.6 kg.   Code status:   Code Status: Full Code   DVT Prophylaxis: heparin injection 5,000 Units Start: 02/27/22 2200   Family Communication: None at bedside   Disposition Plan: Status is: Inpatient Remains inpatient appropriate because: Pneumonia in immunocompromised-ESRD patient-need to follow cultures before discharge.   Planned Discharge Destination:Home   Diet: Diet Order             Diet renal with fluid restriction Fluid restriction: 1200 mL Fluid; Room service appropriate? Yes; Fluid consistency: Thin  Diet effective now  Antimicrobial agents: Anti-infectives (From admission, onward)    Start     Dose/Rate Route Frequency Ordered Stop   02/27/22 2200  doxycycline (VIBRA-TABS) tablet 100 mg        100 mg Oral Every 12 hours 02/27/22 2053     02/27/22 2100  cefTRIAXone (ROCEPHIN) 2 g in sodium chloride 0.9 % 100 mL IVPB        2 g 200 mL/hr over 30 Minutes Intravenous Every 24 hours 02/27/22 2053 03/04/22 2059        MEDICATIONS: Scheduled Meds:  aspirin  EC  81 mg Oral Daily   doxycycline  100 mg Oral Q12H   guaiFENesin  600 mg Oral BID   heparin  5,000 Units Subcutaneous Q8H   pantoprazole  40 mg Oral Daily   predniSONE  5 mg Oral Daily   sevelamer carbonate  800 mg Oral TID WC   simvastatin  20 mg Oral QPM   tacrolimus  2 mg Oral BID   Continuous Infusions:  cefTRIAXone (ROCEPHIN)  IV Stopped (02/27/22 2216)   PRN Meds:.acetaminophen **OR** acetaminophen, albuterol, ondansetron **OR** ondansetron (ZOFRAN) IV, senna-docusate   I have personally reviewed following labs and imaging studies  LABORATORY DATA: CBC: Recent Labs  Lab 02/27/22 1647 02/28/22 0015  WBC 6.8 6.4  NEUTROABS 5.2  --   HGB 9.9* 9.8*  HCT 30.9* 30.2*  MCV 99.0 99.7  PLT 193 169    Basic Metabolic Panel: Recent Labs  Lab 02/27/22 1647 02/28/22 0015  NA 140 139  K 3.3* 4.4  CL 94* 96*  CO2 31 30  GLUCOSE 101* 84  BUN 10 16  CREATININE 3.67* 4.36*  CALCIUM 8.2* 8.1*    GFR: CrCl cannot be calculated (Unknown ideal weight.).  Liver Function Tests: Recent Labs  Lab 02/27/22 1647  AST 20  ALT 7  ALKPHOS 97  BILITOT 1.3*  PROT 7.0  ALBUMIN 2.5*   No results for input(s): "LIPASE", "AMYLASE" in the last 168 hours. No results for input(s): "AMMONIA" in the last 168 hours.  Coagulation Profile: No results for input(s): "INR", "PROTIME" in the last 168 hours.  Cardiac Enzymes: No results for input(s): "CKTOTAL", "CKMB", "CKMBINDEX", "TROPONINI" in the last 168 hours.  BNP (last 3 results) No results for input(s): "PROBNP" in the last 8760 hours.  Lipid Profile: No results for input(s): "CHOL", "HDL", "LDLCALC", "TRIG", "CHOLHDL", "LDLDIRECT" in the last 72 hours.  Thyroid Function Tests: No results for input(s): "TSH", "T4TOTAL", "FREET4", "T3FREE", "THYROIDAB" in the last 72 hours.  Anemia Panel: No results for input(s): "VITAMINB12", "FOLATE", "FERRITIN", "TIBC", "IRON", "RETICCTPCT" in the last 72 hours.  Urine analysis:     Component Value Date/Time   COLORURINE YELLOW 09/18/2012 2057   APPEARANCEUR CLEAR 09/18/2012 2057   LABSPEC 1.021 09/18/2012 2057   PHURINE 6.0 09/18/2012 2057   GLUCOSEU NEGATIVE 09/18/2012 2057   HGBUR NEGATIVE 09/18/2012 2057   BILIRUBINUR NEGATIVE 09/18/2012 2057   KETONESUR NEGATIVE 09/18/2012 2057   PROTEINUR >300 (A) 09/18/2012 2057   UROBILINOGEN 0.2 09/18/2012 2057   NITRITE NEGATIVE 09/18/2012 2057   LEUKOCYTESUR NEGATIVE 09/18/2012 2057    Sepsis Labs: Lactic Acid, Venous    Component Value Date/Time   LATICACIDVEN 1.7 02/27/2022 1750    MICROBIOLOGY: Recent Results (from the past 240 hour(s))  Resp Panel by RT-PCR (Flu A&B, Covid) Anterior Nasal Swab     Status: None   Collection Time: 02/27/22  4:47 PM   Specimen: Anterior Nasal Swab  Result Value Ref  Range Status   SARS Coronavirus 2 by RT PCR NEGATIVE NEGATIVE Final    Comment: (NOTE) SARS-CoV-2 target nucleic acids are NOT DETECTED.  The SARS-CoV-2 RNA is generally detectable in upper respiratory specimens during the acute phase of infection. The lowest concentration of SARS-CoV-2 viral copies this assay can detect is 138 copies/mL. A negative result does not preclude SARS-Cov-2 infection and should not be used as the sole basis for treatment or other patient management decisions. A negative result may occur with  improper specimen collection/handling, submission of specimen other than nasopharyngeal swab, presence of viral mutation(s) within the areas targeted by this assay, and inadequate number of viral copies(<138 copies/mL). A negative result must be combined with clinical observations, patient history, and epidemiological information. The expected result is Negative.  Fact Sheet for Patients:  EntrepreneurPulse.com.au  Fact Sheet for Healthcare Providers:  IncredibleEmployment.be  This test is no t yet approved or cleared by the [place] FDA and  has  been authorized for detection and/or diagnosis of SARS-CoV-2 by FDA under an Emergency Use Authorization (EUA). This EUA will remain  in effect (meaning this test can be used) for the duration of the COVID-19 declaration under Section 564(b)(1) of the Act, 21 U.S.C.section 360bbb-3(b)(1), unless the authorization is terminated  or revoked sooner.       Influenza A by PCR NEGATIVE NEGATIVE Final   Influenza B by PCR NEGATIVE NEGATIVE Final    Comment: (NOTE) The Xpert Xpress SARS-CoV-2/FLU/RSV plus assay is intended as an aid in the diagnosis of influenza from Nasopharyngeal swab specimens and should not be used as a sole basis for treatment. Nasal washings and aspirates are unacceptable for Xpert Xpress SARS-CoV-2/FLU/RSV testing.  Fact Sheet for Patients: EntrepreneurPulse.com.au  Fact Sheet for Healthcare Providers: IncredibleEmployment.be  This test is not yet approved or cleared by the [place] FDA and has been authorized for detection and/or diagnosis of SARS-CoV-2 by FDA under an Emergency Use Authorization (EUA). This EUA will remain in effect (meaning this test can be used) for the duration of the COVID-19 declaration under Section 564(b)(1) of the Act, 21 U.S.C. section 360bbb-3(b)(1), unless the authorization is terminated or revoked.  Performed at [facility], [place] [address] [place], [place] [address]   Culture, blood (routine x 2)     Status: None (Preliminary result)   Collection Time: 02/27/22  5:50 PM   Specimen: BLOOD LEFT HAND  Result Value Ref Range Status   Specimen Description BLOOD LEFT HAND  Final   Special Requests   Final    BOTTLES DRAWN AEROBIC AND ANAEROBIC Blood Culture results may not be optimal due to an inadequate volume of blood received in culture bottles   Culture   Final    NO GROWTH < 12 HOURS Performed at [facility], [place] [address] [place], [place] [address]    Report Status  PENDING  Incomplete    RADIOLOGY STUDIES/RESULTS: DG Chest 2 View  Result Date: 02/27/2022 CLINICAL DATA:  Pneumonia.  Cough and chills.  Dialysis patient EXAM: CHEST - 2 VIEW COMPARISON:  Chest 08/11/2021 FINDINGS: Cardiac enlargement with vascular congestion. Chronic right pleural effusion and right lower lobe airspace disease similar to the prior study. Small left effusion Subtle airspace disease in the left lung could represent edema or pneumonia. Follow-up chest x-ray recommended. IMPRESSION: 1. Cardiac enlargement with vascular congestion. 2. Chronic right pleural effusion and right lower lobe airspace disease. 3. Subtle airspace disease in the left lung may represent  edema or pneumonia. Electronically Signed   By: Franchot Gallo M.D.   On: 02/27/2022 17:13     LOS: 1 day   Oren Binet, MD  [facility]    To contact the attending provider between 7A-7P or the covering provider during after hours 7P-7A, please log into the web site [email or web address] and access using universal [place] password for that web site. If you do not have the password, please call the hospital operator.  02/28/2022, 8:42 AM

## 2022-02-28 NOTE — Progress Notes (Incomplete)
Initial Nutrition Assessment  DOCUMENTATION CODES:      INTERVENTION:  ***   NUTRITION DIAGNOSIS:     related to   as evidenced by  .  GOAL:      MONITOR:      REASON FOR ASSESSMENT:   Malnutrition Screening Tool    ASSESSMENT:      ***   Average Meal Intake: ***-***: ***% intake x *** recorded meals  Nutritionally Relevant Medications: Scheduled Meds:  aspirin EC  81 mg Oral Daily   doxycycline  100 mg Oral Q12H   guaiFENesin  600 mg Oral BID   heparin  5,000 Units Subcutaneous Q8H   losartan  50 mg Oral Daily   pantoprazole  40 mg Oral Daily   predniSONE  5 mg Oral Daily   sevelamer carbonate  800 mg Oral TID WC   simvastatin  20 mg Oral QPM   tacrolimus  2 mg Oral BID   Continuous Infusions:  cefTRIAXone (ROCEPHIN)  IV Stopped (02/27/22 2216)   PRN Meds:.acetaminophen **OR** acetaminophen, albuterol, hydrALAZINE, ondansetron **OR** ondansetron (ZOFRAN) IV, senna-docusate  Labs Reviewed: ***  NUTRITION - FOCUSED PHYSICAL EXAM:  {RD Focused Exam List:21252}  Diet Order:   Diet Order             Diet renal with fluid restriction Fluid restriction: 1200 mL Fluid; Room service appropriate? Yes; Fluid consistency: Thin  Diet effective now                   EDUCATION NEEDS:      Skin:     Last BM:     Height:   Ht Readings from Last 1 Encounters:  08/30/21 5' (1.524 m)    Weight:   Wt Readings from Last 1 Encounters:  08/30/21 52.6 kg    Ideal Body Weight:     BMI:  There is no height or weight on file to calculate BMI.  Estimated Nutritional Needs:   Kcal:     Protein:     Fluid:       Ranell Patrick, RD, LDN Clinical Dietitian RD pager # available in [place]  After hours/weekend pager # available in [facility]

## 2022-03-01 DIAGNOSIS — J189 Pneumonia, unspecified organism: Secondary | ICD-10-CM | POA: Diagnosis not present

## 2022-03-01 DIAGNOSIS — I10 Essential (primary) hypertension: Secondary | ICD-10-CM | POA: Diagnosis not present

## 2022-03-01 DIAGNOSIS — E440 Moderate protein-calorie malnutrition: Secondary | ICD-10-CM | POA: Insufficient documentation

## 2022-03-01 DIAGNOSIS — N186 End stage renal disease: Secondary | ICD-10-CM | POA: Diagnosis not present

## 2022-03-01 DIAGNOSIS — Z940 Kidney transplant status: Secondary | ICD-10-CM

## 2022-03-01 LAB — HEPATITIS B SURFACE ANTIBODY, QUANTITATIVE: Hep B S AB Quant (Post): 4.9 m[IU]/mL — ABNORMAL LOW (ref >9.9)

## 2022-03-01 MED ORDER — RENA-VITE PO TABS
1.0000 | ORAL_TABLET | Freq: Every day | ORAL | Status: DC
  Administered 2022-03-01: 1 via ORAL
  Filled 2022-03-01: qty 1

## 2022-03-01 MED ORDER — HEPARIN SODIUM (PORCINE) 1000 UNIT/ML DIALYSIS
2400.0000 [IU] | Freq: Once | INTRAMUSCULAR | Status: DC
  Filled 2022-03-01: qty 3

## 2022-03-01 NOTE — Progress Notes (Signed)
Pt receives out-pt HD at [facility] on TTS 10:55 chair time. Will assist as needed.   Melven Sartorius Renal Navigator [phone]

## 2022-03-01 NOTE — Progress Notes (Signed)
[facility] Progress Note   Subjective:   Patient seen and examined in room.  Feeling better today. Denies fever, chills, CP, abdominal pain and n/v/d. Admits to SOB w/exertion while walking around her room.    Objective Vitals:   02/28/22 2024 03/01/22 0336 03/01/22 0358 03/01/22 0859  BP: (!) 152/72 (!) 166/82  (!) 164/83  Pulse: 75 92  96  Resp: '19 16  14  '$ Temp: 97.9 F (36.6 C) 97.8 F (36.6 C)  97.9 F (36.6 C)  TempSrc: Oral Oral  Oral  SpO2: 99% 95%  95%  Weight:   56 kg    Physical Exam General:well appearing female in NAD Heart:RRR, no mrg Lungs:CTAB, nml WOB on RA Abdomen:soft, NTND Extremities:no LE edema Dialysis Access: RU AVF +b/t   Filed Weights   02/28/22 1100 03/01/22 0358  Weight: 55.4 kg 56 kg   No intake or output data in the 24 hours ending 03/01/22 1431  Additional Objective Labs: Basic Metabolic Panel: Recent Labs  Lab 02/27/22 1647 02/28/22 0015  NA 140 139  K 3.3* 4.4  CL 94* 96*  CO2 31 30  GLUCOSE 101* 84  BUN 10 16  CREATININE 3.67* 4.36*  CALCIUM 8.2* 8.1*   Liver Function Tests: Recent Labs  Lab 02/27/22 1647  AST 20  ALT 7  ALKPHOS 97  BILITOT 1.3*  PROT 7.0  ALBUMIN 2.5*   CBC: Recent Labs  Lab 02/27/22 1647 02/28/22 0015  WBC 6.8 6.4  NEUTROABS 5.2  --   HGB 9.9* 9.8*  HCT 30.9* 30.2*  MCV 99.0 99.7  PLT 193 231   Blood Culture    Component Value Date/Time   SDES BLOOD SITE NOT SPECIFIED 02/28/2022 0310   SPECREQUEST  02/28/2022 0310    BOTTLES DRAWN AEROBIC AND ANAEROBIC Blood Culture adequate volume   CULT  02/28/2022 0310    NO GROWTH 1 DAY Performed at [facility], [place] [address], [place], [place] [address]    REPTSTATUS PENDING 02/28/2022 0310    Studies/Results: DG Chest 2 View  Result Date: 02/27/2022 CLINICAL DATA:  Pneumonia.  Cough and chills.  Dialysis patient EXAM: CHEST - 2 VIEW COMPARISON:  Chest 08/11/2021 FINDINGS: Cardiac enlargement with vascular  congestion. Chronic right pleural effusion and right lower lobe airspace disease similar to the prior study. Small left effusion Subtle airspace disease in the left lung could represent edema or pneumonia. Follow-up chest x-ray recommended. IMPRESSION: 1. Cardiac enlargement with vascular congestion. 2. Chronic right pleural effusion and right lower lobe airspace disease. 3. Subtle airspace disease in the left lung may represent edema or pneumonia. Electronically Signed   By: Franchot Gallo M.D.   On: 02/27/2022 17:13    Medications:  cefTRIAXone (ROCEPHIN)  IV 2 g (02/28/22 2122)    aspirin EC  81 mg Oral Daily   Chlorhexidine Gluconate Cloth  6 each Topical Q0600   doxycycline  100 mg Oral Q12H   guaiFENesin  600 mg Oral BID   heparin  2,400 Units Dialysis Once in dialysis   heparin  5,000 Units Subcutaneous Q8H   losartan  50 mg Oral Daily   multivitamin  1 tablet Oral QHS   pantoprazole  40 mg Oral Daily   predniSONE  5 mg Oral Daily   sevelamer carbonate  800 mg Oral TID WC   simvastatin  20 mg Oral QPM   tacrolimus  2 mg Oral BID    Dialysis Orders:  [place] TTS 4h  400/ 600  55.2kg  2/2 bath  AVF  Hep 2400 - last HD 11/28, post wt 55.7kg - hectorol 1 ug IV tiw - venofer '100mg'$  tiw through 12/09 - mircera 100 q2, last 11/25     Assessment/ Plan: CAP - Getting IV abx here per pmd. Stable today.  Fever - likely d/t CAP.  BC collected at outpatient HD on 11/28 w/NGTD ESRD - on HD TTS.  HD today per regular schedule.  HTN/ volume - BP's slightly up, has several "prn" meds she uses at home. No vol excess on exam. At dry wt. UF as tolerated.  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 Last esa was 4 days ago at op unit MBD ckd - CCa in range, cont IV vdra and renvela as binder.  Nutrition - Renal diet w/fluid restrictions  Jen Mow, PA-C [facility] 03/01/2022,2:31 PM  LOS: 2 days

## 2022-03-01 NOTE — Plan of Care (Signed)
?  Problem: Coping: ?Goal: Level of anxiety will decrease ?Outcome: Progressing ?  ?Problem: Safety: ?Goal: Ability to remain free from injury will improve ?Outcome: Progressing ?  ?

## 2022-03-01 NOTE — Progress Notes (Signed)
Initial Nutrition Assessment  DOCUMENTATION CODES:   Non-severe (moderate) malnutrition in context of chronic illness  INTERVENTION:  -Liberalize diet to low Na w/ 1248m fluid restriction -Start Rena-vit daily -Monitor labs and adjust nutrition as clinically appropriate  NUTRITION DIAGNOSIS:  Moderate Malnutrition related to chronic illness as evidenced by energy intake < or equal to 75% for > or equal to 1 month, mild fat depletion, moderate muscle depletion.  GOAL:  Patient will meet greater than or equal to 90% of their needs  MONITOR:  PO intake  REASON FOR ASSESSMENT:  Malnutrition Screening Tool    ASSESSMENT:  Pt is a 89yoF with PMH of ESRD (s/p failed DDKT x 2) on HD TTS, HTN, HLD, anemia of CKD, seizures, and neutropenia who presents from HD center with cough and chills.  Visited pt at bedside this morning. She reports decreased appetite and po intake and believes she has lost weight but is unsure how much weight. Pt reports dry weight at HD center is 55.4kg but she's anticipating they change that. Pt reports she has not been eating meals at home but snacking instead. Usual foods include: ice cream, sherbet, cookies, chips. Pt will eat a protein bar at HD but does not like protein shakes. Likely meeting <75% estimated needs for >1 month. NFPE shows mild fat loss and moderate to severe muscle wasting. Pt meets ASPEN criteria for moderate protein calorie malnutrition.   Note pt's K has been low or WNL during admission. Recommend liberalizing diet to low Na only. Discussed with pt and she thinks this may help with po intake. She is proficient at diet adjustments based on labs. HD today per outpatient schedule. Continue to follow.  Medications reviewed and include: protonix, prednisone, renvela, zocor  Labs reviewed: Na:139, K:4.4, Cr:4.36, GFR:11   NUTRITION - FOCUSED PHYSICAL EXAM:  Flowsheet Row Most Recent Value  Orbital Region Mild depletion  Upper Arm Region  Severe depletion  Thoracic and Lumbar Region Moderate depletion  Buccal Region Mild depletion  Temple Region Moderate depletion  Clavicle Bone Region Moderate depletion  Clavicle and Acromion Bone Region Moderate depletion  Scapular Bone Region Moderate depletion  Dorsal Hand Moderate depletion  Patellar Region Severe depletion  Anterior Thigh Region Severe depletion  Posterior Calf Region Moderate depletion  Hair Reviewed  Eyes Reviewed  Mouth Reviewed  Skin Reviewed  Nails Reviewed       Diet Order:   Diet Order             Diet 2 gram sodium Room service appropriate? Yes; Fluid consistency: Thin; Fluid restriction: 1200 mL Fluid  Diet effective now                   EDUCATION NEEDS:  Education needs have been addressed  Skin:  Skin Assessment: Reviewed RN Assessment  Last BM:  11/29  Height:  Ht Readings from Last 1 Encounters:  08/30/21 5' (1.524 m)   Weight:  Wt Readings from Last 1 Encounters:  03/01/22 56 kg    BMI:  Body mass index is 24.11 kg/m.  Estimated Nutritional Needs:  Kcal:  1400-1700kcal Protein: 65-80g Fluid:  10082m+ UOP  KaCandise BowensMS, RD, LDN, CNSC See AMiON for contact information

## 2022-03-01 NOTE — Progress Notes (Signed)
PROGRESS NOTE        PATIENT DETAILS Name: Karen [name] Age: 66 y.o. Sex: female Date of Birth: May 19, 1955 Admit Date: 02/27/2022 Admitting Physician Lenore Cordia, MD WGN:FAOZ, Hassell Done, MD  Brief Summary: Patient is a 66 y.o.  female with history of ESRD-TTS (s/p failed DDKT x 2-still on immunosuppressive's) sent to [facility] ED from outpatient HD center for fevers/chills with hemodialysis.  Significant events: 11/28>> admit to [facility]-possible PNA.  Significant studies: 11/28>> CXR: Subtle airspace disease in the left lung.  Significant microbiology data: 11/28>> COVID/influenza PCR: Negative 11/28>> blood cultures: Negative 11/29>> blood cultures: Ruled  Procedures: None  Consults: Nephrology  Subjective: No major complaints.  No fever overnight.  Objective: Vitals: Blood pressure (!) 164/83, pulse 96, temperature 97.9 F (36.6 C), temperature source Oral, resp. rate 14, weight 56 kg, SpO2 95 %.   Exam: Gen Exam:Alert awake-not in any distress HEENT:atraumatic, normocephalic Chest: B/L clear to auscultation anteriorly CVS:S1S2 regular Abdomen:soft non tender, non distended Extremities:no edema Neurology: Non focal Skin: no rash  Pertinent Labs/Radiology:    Latest Ref Rng & Units 02/28/2022   12:15 AM 02/27/2022    4:47 PM 08/11/2021    6:11 AM  CBC  WBC 4.0 - 10.5 K/uL 6.4  6.8    Hemoglobin 12.0 - 15.0 g/dL 9.8  9.9  13.6   Hematocrit 36.0 - 46.0 % 30.2  30.9  40.0   Platelets 150 - 400 K/uL 231  193      Lab Results  Component Value Date   NA 139 02/28/2022   K 4.4 02/28/2022   CL 96 (L) 02/28/2022   CO2 30 02/28/2022      Assessment/Plan: PNA 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   Afebrile/no leukocytosis  Continue empiric antibiotics and follow culture data  Monitor for another 24 hours given immunocompromise status-if continues to respond  to current antimicrobial regimen-we can consider discharging home on 12/1.    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    HLD Statin  History of seizure Not on any AEDs Apparently had a provoked seizure due to PRES.   Nutrition Status: Nutrition Problem: Moderate Malnutrition Etiology: chronic illness Signs/Symptoms: energy intake < or equal to 75% for > or equal to 1 month, mild fat depletion, moderate muscle depletion Interventions: Liberalize Diet    BMI: Estimated body mass index is 24.11 kg/m as calculated from the following:   Height as of 08/30/21: 5' (1.524 m).   Weight as of this encounter: 56 kg.   Code status:   Code Status: Full Code   DVT Prophylaxis: heparin injection 5,000 Units Start: 02/27/22 2200   Family Communication: None at bedside   Disposition Plan: Status is: Inpatient Remains inpatient appropriate because: Pneumonia in immunocompromised-ESRD patient-need to follow cultures before discharge.   Planned Discharge Destination:Home likely on 12/1 if clinical improvement continues.   Diet: Diet Order             Diet 2 gram sodium Room service appropriate? Yes; Fluid consistency: Thin; Fluid restriction: 1200 mL Fluid  Diet effective now  Antimicrobial agents: Anti-infectives (From admission, onward)    Start     Dose/Rate Route Frequency Ordered Stop   02/27/22 2200  doxycycline (VIBRA-TABS) tablet 100 mg        100 mg Oral Every 12 hours 02/27/22 2053 03/04/22 2159   02/27/22 2100  cefTRIAXone (ROCEPHIN) 2 g in sodium chloride 0.9 % 100 mL IVPB        2 g 200 mL/hr over 30 Minutes  Intravenous Every 24 hours 02/27/22 2053 03/04/22 2059        MEDICATIONS: Scheduled Meds:  aspirin EC  81 mg Oral Daily   Chlorhexidine Gluconate Cloth  6 each Topical Q0600   doxycycline  100 mg Oral Q12H   guaiFENesin  600 mg Oral BID   heparin  2,400 Units Dialysis Once in dialysis   heparin  5,000 Units Subcutaneous Q8H   losartan  50 mg Oral Daily   multivitamin  1 tablet Oral QHS   pantoprazole  40 mg Oral Daily   predniSONE  5 mg Oral Daily   sevelamer carbonate  800 mg Oral TID WC   simvastatin  20 mg Oral QPM   tacrolimus  2 mg Oral BID   Continuous Infusions:  cefTRIAXone (ROCEPHIN)  IV 2 g (02/28/22 2122)   PRN Meds:.acetaminophen **OR** acetaminophen, albuterol, hydrALAZINE, ondansetron **OR** ondansetron (ZOFRAN) IV, senna-docusate   I have personally reviewed following labs and imaging studies  LABORATORY DATA: CBC: Recent Labs  Lab 02/27/22 1647 02/28/22 0015  WBC 6.8 6.4  NEUTROABS 5.2  --   HGB 9.9* 9.8*  HCT 30.9* 30.2*  MCV 99.0 99.7  PLT 193 231     Basic Metabolic Panel: Recent Labs  Lab 02/27/22 1647 02/28/22 0015  NA 140 139  K 3.3* 4.4  CL 94* 96*  CO2 31 30  GLUCOSE 101* 84  BUN 10 16  CREATININE 3.67* 4.36*  CALCIUM 8.2* 8.1*     GFR: CrCl cannot be calculated (Unknown ideal weight.).  Liver Function Tests: Recent Labs  Lab 02/27/22 1647  AST 20  ALT 7  ALKPHOS 97  BILITOT 1.3*  PROT 7.0  ALBUMIN 2.5*    No results for input(s): "LIPASE", "AMYLASE" in the last 168 hours. No results for input(s): "AMMONIA" in the last 168 hours.  Coagulation Profile: No results for input(s): "INR", "PROTIME" in the last 168 hours.  Cardiac Enzymes: No results for input(s): "CKTOTAL", "CKMB", "CKMBINDEX", "TROPONINI" in the last 168 hours.  BNP (last 3 results) No results for input(s): "PROBNP" in the last 8760 hours.  Lipid Profile: No results for input(s): "CHOL", "HDL", "LDLCALC", "TRIG", "CHOLHDL", "LDLDIRECT" in the  last 72 hours.  Thyroid Function Tests: No results for input(s): "TSH", "T4TOTAL", "FREET4", "T3FREE", "THYROIDAB" in the last 72 hours.  Anemia Panel: No results for input(s): "VITAMINB12", "FOLATE", "FERRITIN", "TIBC", "IRON", "RETICCTPCT" in the last 72 hours.  Urine analysis:    Component Value Date/Time   COLORURINE YELLOW 09/18/2012 2057   APPEARANCEUR CLEAR 09/18/2012 2057   LABSPEC 1.021 09/18/2012 2057   PHURINE 6.0 09/18/2012 2057   GLUCOSEU NEGATIVE 09/18/2012 2057   HGBUR NEGATIVE 09/18/2012 2057   BILIRUBINUR NEGATIVE 09/18/2012 2057   KETONESUR NEGATIVE 09/18/2012 2057   PROTEINUR >300 (A) 09/18/2012 2057   UROBILINOGEN 0.2 09/18/2012 2057   NITRITE NEGATIVE 09/18/2012 2057   LEUKOCYTESUR NEGATIVE 09/18/2012 2057    Sepsis Labs: Lactic Acid, Venous    Component Value Date/Time   LATICACIDVEN 1.7 02/27/2022 1750    MICROBIOLOGY: Recent  Results (from the past 240 hour(s))  Resp Panel by RT-PCR (Flu A&B, Covid) Anterior Nasal Swab     Status: None   Collection Time: 02/27/22  4:47 PM   Specimen: Anterior Nasal Swab  Result Value Ref Range Status   SARS Coronavirus 2 by RT PCR NEGATIVE NEGATIVE Final    Comment: (NOTE) SARS-CoV-2 target nucleic acids are NOT DETECTED.  The SARS-CoV-2 RNA is generally detectable in upper respiratory specimens during the acute phase of infection. The lowest concentration of SARS-CoV-2 viral copies this assay can detect is 138 copies/mL. A negative result does not preclude SARS-Cov-2 infection and should not be used as the sole basis for treatment or other patient management decisions. A negative result may occur with  improper specimen collection/handling, submission of specimen other than nasopharyngeal swab, presence of viral mutation(s) within the areas targeted by this assay, and inadequate number of viral copies(<138 copies/mL). A negative result must be combined with clinical observations, patient history, and  epidemiological information. The expected result is Negative.  Fact Sheet for Patients:  EntrepreneurPulse.com.au  Fact Sheet for Healthcare Providers:  IncredibleEmployment.be  This test is no t yet approved or cleared by the [place] FDA and  has been authorized for detection and/or diagnosis of SARS-CoV-2 by FDA under an Emergency Use Authorization (EUA). This EUA will remain  in effect (meaning this test can be used) for the duration of the COVID-19 declaration under Section 564(b)(1) of the Act, 21 U.S.C.section 360bbb-3(b)(1), unless the authorization is terminated  or revoked sooner.       Influenza A by PCR NEGATIVE NEGATIVE Final   Influenza B by PCR NEGATIVE NEGATIVE Final    Comment: (NOTE) The Xpert Xpress SARS-CoV-2/FLU/RSV plus assay is intended as an aid in the diagnosis of influenza from Nasopharyngeal swab specimens and should not be used as a sole basis for treatment. Nasal washings and aspirates are unacceptable for Xpert Xpress SARS-CoV-2/FLU/RSV testing.  Fact Sheet for Patients: EntrepreneurPulse.com.au  Fact Sheet for Healthcare Providers: IncredibleEmployment.be  This test is not yet approved or cleared by the [place] FDA and has been authorized for detection and/or diagnosis of SARS-CoV-2 by FDA under an Emergency Use Authorization (EUA). This EUA will remain in effect (meaning this test can be used) for the duration of the COVID-19 declaration under Section 564(b)(1) of the Act, 21 U.S.C. section 360bbb-3(b)(1), unless the authorization is terminated or revoked.  Performed at [facility], [place] [address], [place], [place] [address]   Culture, blood (routine x 2)     Status: None (Preliminary result)   Collection Time: 02/27/22  5:50 PM   Specimen: BLOOD LEFT HAND  Result Value Ref Range Status   Specimen Description BLOOD LEFT HAND  Final   Special  Requests   Final    BOTTLES DRAWN AEROBIC AND ANAEROBIC Blood Culture results may not be optimal due to an inadequate volume of blood received in culture bottles   Culture   Final    NO GROWTH 2 DAYS Performed at [facility], [place] [address], [place], [place] [address]    Report Status PENDING  Incomplete  Culture, blood (routine x 2)     Status: None (Preliminary result)   Collection Time: 02/28/22  3:10 AM   Specimen: BLOOD  Result Value Ref Range Status   Specimen Description BLOOD SITE NOT SPECIFIED  Final   Special Requests   Final    BOTTLES DRAWN AEROBIC AND ANAEROBIC Blood Culture adequate volume  Culture   Final    NO GROWTH 1 DAY Performed at [facility], [place] [address], [place], [place] [address]    Report Status PENDING  Incomplete    RADIOLOGY STUDIES/RESULTS: DG Chest 2 View  Result Date: 02/27/2022 CLINICAL DATA:  Pneumonia.  Cough and chills.  Dialysis patient EXAM: CHEST - 2 VIEW COMPARISON:  Chest 08/11/2021 FINDINGS: Cardiac enlargement with vascular congestion. Chronic right pleural effusion and right lower lobe airspace disease similar to the prior study. Small left effusion Subtle airspace disease in the left lung could represent edema or pneumonia. Follow-up chest x-ray recommended. IMPRESSION: 1. Cardiac enlargement with vascular congestion. 2. Chronic right pleural effusion and right lower lobe airspace disease. 3. Subtle airspace disease in the left lung may represent edema or pneumonia. Electronically Signed   By: Franchot Gallo M.D.   On: 02/27/2022 17:13     LOS: 2 days   Oren Binet, MD  [facility]    To contact the attending provider between 7A-7P or the covering provider during after hours 7P-7A, please log into the web site [email or web address] and access using universal [place] password for that web site. If you do not have the password, please call the hospital operator.  03/01/2022, 12:01 PM

## 2022-03-01 NOTE — Progress Notes (Signed)
Received patient in bed to unit.  Alert and oriented.  Informed consent signed and in chart.   Treatment initiated: 1552 Treatment completed: 1859  Patient tolerated well.  Transported back to the room  Alert, without acute distress.  Hand-off given to patient's nurse.   Access used: AVF Access issues: none  Total UF removed: 3.6L Medication(s) given: none Post HD VS: 133/71,94,97.9,2098% Post HD weight: 52.4kg   [facility]

## 2022-03-02 ENCOUNTER — Encounter (HOSPITAL_COMMUNITY): Payer: Self-pay

## 2022-03-02 ENCOUNTER — Other Ambulatory Visit (HOSPITAL_COMMUNITY): Payer: Self-pay

## 2022-03-02 DIAGNOSIS — J189 Pneumonia, unspecified organism: Secondary | ICD-10-CM | POA: Diagnosis not present

## 2022-03-02 DIAGNOSIS — I10 Essential (primary) hypertension: Secondary | ICD-10-CM | POA: Diagnosis not present

## 2022-03-02 DIAGNOSIS — Z940 Kidney transplant status: Secondary | ICD-10-CM | POA: Diagnosis not present

## 2022-03-02 DIAGNOSIS — N186 End stage renal disease: Secondary | ICD-10-CM | POA: Diagnosis not present

## 2022-03-02 LAB — CBC
HCT: 37.4 % (ref 36.0–46.0)
Hemoglobin: 12.2 g/dL (ref 12.0–15.0)
MCH: 32 pg (ref 26.0–34.0)
MCHC: 32.6 g/dL (ref 30.0–36.0)
MCV: 98.2 fL (ref 80.0–100.0)
Platelets: 293 10*3/uL (ref 150–400)
RBC: 3.81 MIL/uL — ABNORMAL LOW (ref 3.87–5.11)
RDW: 15.2 % (ref 11.5–15.5)
WBC: 10.2 10*3/uL (ref 4.0–10.5)
nRBC: 0.5 % — ABNORMAL HIGH (ref 0.0–0.2)

## 2022-03-02 MED ORDER — CEFADROXIL 500 MG PO CAPS
500.0000 mg | ORAL_CAPSULE | Freq: Every day | ORAL | 0 refills | Status: DC
  Filled 2022-03-02: qty 4, 4d supply, fill #0

## 2022-03-02 MED ORDER — CEFADROXIL 1 G PO TABS
1.0000 g | ORAL_TABLET | Freq: Two times a day (BID) | ORAL | 0 refills | Status: DC
  Filled 2022-03-02: qty 8, 4d supply, fill #0

## 2022-03-02 MED ORDER — BENZONATATE 100 MG PO CAPS
100.0000 mg | ORAL_CAPSULE | Freq: Three times a day (TID) | ORAL | 0 refills | Status: AC | PRN
  Filled 2022-03-02: qty 30, 10d supply, fill #0

## 2022-03-02 NOTE — Progress Notes (Signed)
[facility] Progress Note   Subjective:    Seen and examined patient at bedside. Feeling well with no acute complaints. Reviewed blood culture results from outpatient HD center on 11/28 showed NGTD. Plan for discharge today.  Objective Vitals:   03/01/22 1951 03/01/22 2001 03/02/22 0515 03/02/22 0807  BP: 125/78 110/85 (!) 148/79 (!) 156/71  Pulse: 93 86 90 91  Resp: '16 16 19 18  '$ Temp: 98.6 F (37 C) 98.1 F (36.7 C) 97.8 F (36.6 C) (!) 97.5 F (36.4 C)  TempSrc:  Oral Oral Oral  SpO2: 100% 99% 97% 95%  Weight:       Physical Exam General:well appearing female in NAD Heart:RRR, no mrg Lungs:CTAB, nml WOB on RA Abdomen:soft, NTND Extremities:no LE edema Dialysis Access: RU AVF +b/t    Filed Weights   02/28/22 1100 03/01/22 0358  Weight: 55.4 kg 56 kg    Intake/Output Summary (Last 24 hours) at 03/02/2022 1019 Last data filed at 03/02/2022 0819 Gross per 24 hour  Intake 2053.33 ml  Output --  Net 2053.33 ml    Additional Objective Labs: Basic Metabolic Panel: Recent Labs  Lab 02/27/22 1647 02/28/22 0015  NA 140 139  K 3.3* 4.4  CL 94* 96*  CO2 31 30  GLUCOSE 101* 84  BUN 10 16  CREATININE 3.67* 4.36*  CALCIUM 8.2* 8.1*   Liver Function Tests: Recent Labs  Lab 02/27/22 1647  AST 20  ALT 7  ALKPHOS 97  BILITOT 1.3*  PROT 7.0  ALBUMIN 2.5*   No results for input(s): "LIPASE", "AMYLASE" in the last 168 hours. CBC: Recent Labs  Lab 02/27/22 1647 02/28/22 0015 03/02/22 0544  WBC 6.8 6.4 10.2  NEUTROABS 5.2  --   --   HGB 9.9* 9.8* 12.2  HCT 30.9* 30.2* 37.4  MCV 99.0 99.7 98.2  PLT 193 231 293   Blood Culture    Component Value Date/Time   SDES BLOOD SITE NOT SPECIFIED 02/28/2022 0310   SPECREQUEST  02/28/2022 0310    BOTTLES DRAWN AEROBIC AND ANAEROBIC Blood Culture adequate volume   CULT  02/28/2022 0310    NO GROWTH 2 DAYS Performed at [facility], [place] [address] [place], [place] [address]    REPTSTATUS  PENDING 02/28/2022 0310    Cardiac Enzymes: No results for input(s): "CKTOTAL", "CKMB", "CKMBINDEX", "TROPONINI" in the last 168 hours. CBG: No results for input(s): "GLUCAP" in the last 168 hours. Iron Studies: No results for input(s): "IRON", "TIBC", "TRANSFERRIN", "FERRITIN" in the last 72 hours. Lab Results  Component Value Date   INR 1.2 01/08/2021   INR 1.2 HEMOLYZED SPECIMEN, RESULTS MAY BE AFFECTED 12/03/2008   INR 1.4 11/12/2006   Studies/Results: No results found.  Medications:  cefTRIAXone (ROCEPHIN)  IV 200 mL/hr at 03/02/22 0545    aspirin EC  81 mg Oral Daily   Chlorhexidine Gluconate Cloth  6 each Topical Q0600   doxycycline  100 mg Oral Q12H   guaiFENesin  600 mg Oral BID   heparin  5,000 Units Subcutaneous Q8H   losartan  50 mg Oral Daily   multivitamin  1 tablet Oral QHS   pantoprazole  40 mg Oral Daily   predniSONE  5 mg Oral Daily   sevelamer carbonate  800 mg Oral TID WC   simvastatin  20 mg Oral QPM   tacrolimus  2 mg Oral BID    Dialysis Orders: [facility] 4h  400/ 600  55.2kg  2/2 bath  AVF  Hep 2400 - last HD 11/28, post wt 55.7kg - hectorol 1 ug IV tiw - venofer '100mg'$  tiw through 12/09 - mircera 100 q2, last 11/25  Assessment/Plan: CAP - Getting IV abx here per pmd. Remains stable. Fever - likely d/t CAP.  BC collected at outpatient HD on 11/28 w/NGTD ESRD - on HD TTS. Will resume HD tomorrow in outpatient.  HTN/ volume - BP's slightly up, has several "prn" meds she uses at home. No vol excess on exam. At dry wt. UF as tolerated.  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 Last esa was 4 days ago at op unit MBD ckd - CCa in range, cont IV vdra and renvela as binder.  Nutrition - Renal diet w/fluid restrictions Dispo-BC collected at outpatient HD on 11/28 w/NGTD. Okay for patient to be discharged from renal standpoint. Plan to resume HD tomorrow in outpatient.  Tobie Poet,  NP [facility] 03/02/2022,10:19 AM  LOS: 3 days

## 2022-03-02 NOTE — Discharge Summary (Signed)
PATIENT DETAILS Name: Karen [name] Age: 66 y.o. Sex: female Date of Birth: 1955-04-07 MRN: [id number]. Admitting Physician: Lenore Cordia, MD WUJ:[id number], Hassell Done, MD  Admit Date: 02/27/2022 Discharge date: 03/02/2022  Recommendations for Outpatient Follow-up:  Follow up with PCP in 1-2 weeks Please obtain CMP/CBC in one week Please follow blood cultures until final.  Admitted From:  Home  Disposition: Home   Discharge Condition: good  CODE STATUS:   Code Status: Full Code   Diet recommendation:  Diet Order             Diet - low sodium heart healthy           Diet 2 gram sodium Room service appropriate? Yes; Fluid consistency: Thin; Fluid restriction: 1200 mL Fluid  Diet effective now                    Brief Summary: Patient is a 66 y.o.  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   Significant events: 11/28>> admit to [facility]-possible PNA.   Significant studies: 11/28>> CXR: Subtle airspace disease in the left lung.   Significant microbiology data: 11/28>> COVID/influenza PCR: Negative 11/28>> blood cultures: Negative 11/29>> blood cultures: Ruled   Procedures: None   Consults: Nephrology  Brief Hospital Course: Community-acquired PNA Immunocompromised status-s/p DDKT x 2 on chronic immunosuppressive's Significantly improved-some cough-afebrile for almost 72 hours  No leukocytosis  Cultures done here and at the outpatient HD negative so far.   Treated with Rocephin/doxycycline-will be switched to an oral cephalosporin for a few more days on discharge.   PCP/primary nephrology to follow cultures until final.     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 On Prograf/prednisone   Normocytic anemia Due to ESRD/CKD Aranesp/iron defer to nephrology service    HTN Per patient-she watches her blood pressure very closely and takes her antihypertensives as needed No longer on amlodipine Resume her usual regimen postdischarge.   HLD Statin   History of seizure Not on any AEDs Apparently had a provoked seizure due to PRES.    Nutrition Status: Nutrition Problem: Moderate Malnutrition Etiology: chronic illness Signs/Symptoms: energy intake < or equal to 75% for > or equal to 1 month, mild fat depletion, moderate muscle depletion Interventions: Liberalize Diet    BMI: Estimated body mass index is 24.11 kg/m as calculated from the following:   Height as of 08/30/21: 5' (1.524 m).   Weight as of this encounter: 56 kg.   Discharge Diagnoses:  Principal Problem:   Community acquired pneumonia of left lung Active Problems:   ESRD on hemodialysis ([place])   Hypertension   History of seizure   Renal transplant recipient   Dyslipidemia   Malnutrition of moderate degree   Discharge Instructions:  Activity:  As tolerated   Discharge Instructions     Call MD for:  difficulty breathing, headache or visual disturbances   Complete by: As directed    Call MD for:  temperature >100.4   Complete by: As directed    Diet - low sodium heart healthy   Complete by: As directed    Discharge instructions   Complete by: As directed    Follow with Primary MD   in 1-2 weeks  Resume your prior hemodialysis as previous.  Please ask your primary care MD/primary nephrologist to follow up on blood cultures done  at [facility] and also at your outpatient dialysis clinic until it is final.  Please get a complete blood count and chemistry panel checked by your Primary MD at your next visit, and again as instructed by your Primary MD.  Get Medicines reviewed and adjusted: Please take all your medications with you for your next visit with your Primary MD  Laboratory/radiological data: Please request your Primary MD to go over all hospital tests  and procedure/radiological results at the follow up, please ask your Primary MD to get all Hospital records sent to his/her office.  In some cases, they will be blood work, cultures and biopsy results pending at the time of your discharge. Please request that your primary care M.D. follows up on these results.  Also Note the following: If you experience worsening of your admission symptoms, develop shortness of breath, life threatening emergency, suicidal or homicidal thoughts you must seek medical attention immediately by calling 911 or calling your MD immediately  if symptoms less severe.  You must read complete instructions/literature along with all the possible adverse reactions/side effects for all the Medicines you take and that have been prescribed to you. Take any new Medicines after you have completely understood and accpet all the possible adverse reactions/side effects.   Do not drive when taking Pain medications or sleeping medications (Benzodaizepines)  Do not take more than prescribed Pain, Sleep and Anxiety Medications. It is not advisable to combine anxiety,sleep and pain medications without talking with your primary care practitioner  Special Instructions: If you have smoked or chewed Tobacco  in the last 2 yrs please stop smoking, stop any regular Alcohol  and or any Recreational drug use.  Wear Seat belts while driving.  Please note: You were cared for by a hospitalist during your hospital stay. Once you are discharged, your primary care physician will handle any further medical issues. Please note that NO REFILLS for any discharge medications will be authorized once you are discharged, as it is imperative that you return to your primary care physician (or establish a relationship with a primary care physician if you do not have one) for your post hospital discharge needs so that they can reassess your need for medications and monitor your lab values.   Increase activity slowly    Complete by: As directed       Allergies as of 03/02/2022   No Known Allergies      Medication List     STOP taking these medications    amLODipine 10 MG tablet Commonly known as: NORVASC       TAKE these medications    aspirin EC 81 MG tablet Take 81 mg by mouth daily.   benzonatate 100 MG capsule Commonly known as: Tessalon Perles Take 1 capsule (100 mg total) by mouth 3 (three) times daily as needed for cough.   calcitRIOL 0.25 MCG capsule Commonly known as: ROCALTROL Take 0.25 mcg by mouth every Monday, Wednesday, and Friday.   cefadroxil 500 MG capsule Commonly known as: DURICEF Take 1 capsule (500 mg total) by mouth daily.   hydrALAZINE 100 MG tablet Commonly known as: APRESOLINE Take 100 mg by mouth daily as needed (for systolic blood pressure of 157 and above).   labetalol 300 MG tablet Commonly known as: NORMODYNE Take 300 mg by mouth daily as needed (for systolic blood pressur of 157 and above).   losartan 100 MG tablet Commonly known as: COZAAR Take 100 mg by mouth daily as needed (for  systolic blood pressure of 157 and above).   magnesium oxide 400 MG tablet Commonly known as: MAG-OX Take 400 mg by mouth 2 (two) times daily.   omeprazole 20 MG capsule Commonly known as: PRILOSEC Take 20 mg by mouth daily.   oxyCODONE-acetaminophen 5-325 MG tablet Commonly known as: Percocet Take 1 tablet by mouth every 6 (six) hours as needed.   predniSONE 5 MG tablet Commonly known as: DELTASONE Take 5 mg by mouth daily.   sevelamer carbonate 800 MG tablet Commonly known as: RENVELA Take 800 mg by mouth 3 (three) times daily with meals. Take 1 tablet with snacks   simvastatin 20 MG tablet Commonly known as: ZOCOR Take 20 mg by mouth every evening.   tacrolimus 1 MG capsule Commonly known as: PROGRAF Take 2 capsules (2 mg total) by mouth 2 (two) times daily.   Ventolin HFA 108 (90 Base) MCG/ACT inhaler Generic drug: albuterol Inhale 1 puff  into the lungs 3 times/day as needed-between meals & bedtime.        Follow-up Information     Edrick Oh, MD. Schedule an appointment as soon as possible for a visit in 1 week(s).   Specialty: Nephrology Contact information: [place] [address] [phone]                No Known Allergies   Other Procedures/Studies: DG Chest 2 View  Result Date: 02/27/2022 CLINICAL DATA:  Pneumonia.  Cough and chills.  Dialysis patient EXAM: CHEST - 2 VIEW COMPARISON:  Chest 08/11/2021 FINDINGS: Cardiac enlargement with vascular congestion. Chronic right pleural effusion and right lower lobe airspace disease similar to the prior study. Small left effusion Subtle airspace disease in the left lung could represent edema or pneumonia. Follow-up chest x-ray recommended. IMPRESSION: 1. Cardiac enlargement with vascular congestion. 2. Chronic right pleural effusion and right lower lobe airspace disease. 3. Subtle airspace disease in the left lung may represent edema or pneumonia. Electronically Signed   By: Franchot Gallo M.D.   On: 02/27/2022 17:13     TODAY-DAY OF DISCHARGE:  Subjective:   Bellamie Yera today has no headache,no chest abdominal pain,no new weakness tingling or numbness, feels much better wants to go home today.  Objective:   Blood pressure (!) 156/71, pulse 91, temperature (!) 97.5 F (36.4 C), temperature source Oral, resp. rate 18, weight 56 kg, SpO2 95 %.  Intake/Output Summary (Last 24 hours) at 03/02/2022 0918 Last data filed at 03/02/2022 0819 Gross per 24 hour  Intake 2053.33 ml  Output --  Net 2053.33 ml   Filed Weights   02/28/22 1100 03/01/22 0358  Weight: 55.4 kg 56 kg    Exam: Awake Alert, Oriented *3, No new F.N deficits, Normal affect [place].AT,PERRAL Supple Neck,No JVD, No cervical lymphadenopathy appriciated.  Symmetrical Chest wall movement, Good air movement bilaterally, CTAB RRR,No Gallops,Rubs or new Murmurs, No Parasternal  Heave +ve B.Sounds, Abd Soft, Non tender, No organomegaly appriciated, No rebound -guarding or rigidity. No Cyanosis, Clubbing or edema, No new Rash or bruise   PERTINENT RADIOLOGIC STUDIES: No results found.   PERTINENT LAB RESULTS: CBC: Recent Labs    02/28/22 0015 03/02/22 0544  WBC 6.4 10.2  HGB 9.8* 12.2  HCT 30.2* 37.4  PLT 231 293   CMET CMP     Component Value Date/Time   NA 139 02/28/2022 0015   K 4.4 02/28/2022 0015   CL 96 (L) 02/28/2022 0015   CO2 30 02/28/2022 0015   GLUCOSE 84  02/28/2022 0015   BUN 16 02/28/2022 0015   CREATININE 4.36 (H) 02/28/2022 0015   CALCIUM 8.1 (L) 02/28/2022 0015   PROT 7.0 02/27/2022 1647   ALBUMIN 2.5 (L) 02/27/2022 1647   AST 20 02/27/2022 1647   ALT 7 02/27/2022 1647   ALKPHOS 97 02/27/2022 1647   BILITOT 1.3 (H) 02/27/2022 1647   GFRNONAA 11 (L) 02/28/2022 0015   GFRAA 39 (L) 05/14/2019 1412    GFR CrCl cannot be calculated (Unknown ideal weight.). No results for input(s): "LIPASE", "AMYLASE" in the last 72 hours. No results for input(s): "CKTOTAL", "CKMB", "CKMBINDEX", "TROPONINI" in the last 72 hours. Invalid input(s): "POCBNP" No results for input(s): "DDIMER" in the last 72 hours. No results for input(s): "HGBA1C" in the last 72 hours. No results for input(s): "CHOL", "HDL", "LDLCALC", "TRIG", "CHOLHDL", "LDLDIRECT" in the last 72 hours. No results for input(s): "TSH", "T4TOTAL", "T3FREE", "THYROIDAB" in the last 72 hours.  Invalid input(s): "FREET3" No results for input(s): "VITAMINB12", "FOLATE", "FERRITIN", "TIBC", "IRON", "RETICCTPCT" in the last 72 hours. Coags: No results for input(s): "INR" in the last 72 hours.  Invalid input(s): "PT" Microbiology: Recent Results (from the past 240 hour(s))  Resp Panel by RT-PCR (Flu A&B, Covid) Anterior Nasal Swab     Status: None   Collection Time: 02/27/22  4:47 PM   Specimen: Anterior Nasal Swab  Result Value Ref Range Status   SARS Coronavirus 2 by RT PCR  NEGATIVE NEGATIVE Final    Comment: (NOTE) SARS-CoV-2 target nucleic acids are NOT DETECTED.  The SARS-CoV-2 RNA is generally detectable in upper respiratory specimens during the acute phase of infection. The lowest concentration of SARS-CoV-2 viral copies this assay can detect is 138 copies/mL. A negative result does not preclude SARS-Cov-2 infection and should not be used as the sole basis for treatment or other patient management decisions. A negative result may occur with  improper specimen collection/handling, submission of specimen other than nasopharyngeal swab, presence of viral mutation(s) within the areas targeted by this assay, and inadequate number of viral copies(<138 copies/mL). A negative result must be combined with clinical observations, patient history, and epidemiological information. The expected result is Negative.  Fact Sheet for Patients:  EntrepreneurPulse.com.au  Fact Sheet for Healthcare Providers:  IncredibleEmployment.be  This test is no t yet approved or cleared by the [place] FDA and  has been authorized for detection and/or diagnosis of SARS-CoV-2 by FDA under an Emergency Use Authorization (EUA). This EUA will remain  in effect (meaning this test can be used) for the duration of the COVID-19 declaration under Section 564(b)(1) of the Act, 21 U.S.C.section 360bbb-3(b)(1), unless the authorization is terminated  or revoked sooner.       Influenza A by PCR NEGATIVE NEGATIVE Final   Influenza B by PCR NEGATIVE NEGATIVE Final    Comment: (NOTE) The Xpert Xpress SARS-CoV-2/FLU/RSV plus assay is intended as an aid in the diagnosis of influenza from Nasopharyngeal swab specimens and should not be used as a sole basis for treatment. Nasal washings and aspirates are unacceptable for Xpert Xpress SARS-CoV-2/FLU/RSV testing.  Fact Sheet for Patients: EntrepreneurPulse.com.au  Fact Sheet for  Healthcare Providers: IncredibleEmployment.be  This test is not yet approved or cleared by the [place] FDA and has been authorized for detection and/or diagnosis of SARS-CoV-2 by FDA under an Emergency Use Authorization (EUA). This EUA will remain in effect (meaning this test can be used) for the duration of the COVID-19 declaration under Section 564(b)(1) of the Act, 21  U.S.C. section 360bbb-3(b)(1), unless the authorization is terminated or revoked.  Performed at [facility], [place] [address], [place], [place] [address]   Culture, blood (routine x 2)     Status: None (Preliminary result)   Collection Time: 02/27/22  5:50 PM   Specimen: BLOOD LEFT HAND  Result Value Ref Range Status   Specimen Description BLOOD LEFT HAND  Final   Special Requests   Final    BOTTLES DRAWN AEROBIC AND ANAEROBIC Blood Culture results may not be optimal due to an inadequate volume of blood received in culture bottles   Culture   Final    NO GROWTH 3 DAYS Performed at [facility], [place] [address], [place], [place] [address]    Report Status PENDING  Incomplete  Culture, blood (routine x 2)     Status: None (Preliminary result)   Collection Time: 02/28/22  3:10 AM   Specimen: BLOOD  Result Value Ref Range Status   Specimen Description BLOOD SITE NOT SPECIFIED  Final   Special Requests   Final    BOTTLES DRAWN AEROBIC AND ANAEROBIC Blood Culture adequate volume   Culture   Final    NO GROWTH 2 DAYS Performed at [facility], [address], [place], [place] [address]    Report Status PENDING  Incomplete    FURTHER DISCHARGE INSTRUCTIONS:  Get Medicines reviewed and adjusted: Please take all your medications with you for your next visit with your Primary MD  Laboratory/radiological data: Please request your Primary MD to go over all hospital tests and procedure/radiological results at the follow up, please ask your Primary MD to get all Hospital records  sent to his/her office.  In some cases, they will be blood work, cultures and biopsy results pending at the time of your discharge. Please request that your primary care M.D. goes through all the records of your hospital data and follows up on these results.  Also Note the following: If you experience worsening of your admission symptoms, develop shortness of breath, life threatening emergency, suicidal or homicidal thoughts you must seek medical attention immediately by calling 911 or calling your MD immediately  if symptoms less severe.  You must read complete instructions/literature along with all the possible adverse reactions/side effects for all the Medicines you take and that have been prescribed to you. Take any new Medicines after you have completely understood and accpet all the possible adverse reactions/side effects.   Do not drive when taking Pain medications or sleeping medications (Benzodaizepines)  Do not take more than prescribed Pain, Sleep and Anxiety Medications. It is not advisable to combine anxiety,sleep and pain medications without talking with your primary care practitioner  Special Instructions: If you have smoked or chewed Tobacco  in the last 2 yrs please stop smoking, stop any regular Alcohol  and or any Recreational drug use.  Wear Seat belts while driving.  Please note: You were cared for by a hospitalist during your hospital stay. Once you are discharged, your primary care physician will handle any further medical issues. Please note that NO REFILLS for any discharge medications will be authorized once you are discharged, as it is imperative that you return to your primary care physician (or establish a relationship with a primary care physician if you do not have one) for your post hospital discharge needs so that they can reassess your need for medications and monitor your lab values.  Total Time spent coordinating discharge including counseling, education  and  face to face time equals greater than 30 minutes.  SignedOren Binet 03/02/2022 9:18 AM

## 2022-03-02 NOTE — Progress Notes (Signed)
D/C order noted. Contacted [facility] to advise clinic of pt's d/c today and that pt should resume care at clinic tomorrow.   Melven Sartorius Renal Navigator [phone]

## 2022-03-02 NOTE — Plan of Care (Signed)

## 2022-03-02 NOTE — Care Management Important Message (Signed)
Important Message  Patient Details  Name: Karen [name] MRN: [id number] Date of Birth: 01-Oct-1955   Medicare Important Message Given:  Yes  Patient left prior to IM delivery will mail to the patient home address.    Alanea Woolridge 03/02/2022, 2:31 PM

## 2022-03-04 LAB — CULTURE, BLOOD (ROUTINE X 2): Culture: NO GROWTH

## 2022-03-04 NOTE — TOC Transition Note (Signed)
Transition of care contact from inpatient facility  Date of discharge: 03/02/22 Date of contact: 03/04/22 Method: Attempted Phone Call Spoke to: No Answer  Tried calling patient to discuss transition of care from recent inpatient hospitalization but she didn't pick up the phone. A voicemail was left to contact [facility] for any questions/concerns.  Patient received HD on 03/03/22. Next HD on 03/06/22.  Tobie Poet, NP

## 2022-03-05 LAB — CULTURE, BLOOD (ROUTINE X 2)
Culture: NO GROWTH
Special Requests: ADEQUATE

## 2022-03-22 ENCOUNTER — Encounter (HOSPITAL_COMMUNITY): Payer: Self-pay

## 2022-05-03 ENCOUNTER — Encounter (HOSPITAL_COMMUNITY): Payer: Self-pay

## 2022-05-03 ENCOUNTER — Other Ambulatory Visit: Payer: Self-pay | Admitting: Internal Medicine

## 2022-05-03 DIAGNOSIS — Z1231 Encounter for screening mammogram for malignant neoplasm of breast: Secondary | ICD-10-CM

## 2022-05-04 ENCOUNTER — Ambulatory Visit
Admission: RE | Admit: 2022-05-04 | Discharge: 2022-05-04 | Disposition: A | Discharge status: Home / Self Care | Payer: BC Managed Care – PPO | Source: Ambulatory Visit | Attending: Internal Medicine | Admitting: Internal Medicine

## 2022-05-04 ENCOUNTER — Encounter (HOSPITAL_COMMUNITY): Payer: Self-pay

## 2022-05-04 DIAGNOSIS — Z1231 Encounter for screening mammogram for malignant neoplasm of breast: Secondary | ICD-10-CM

## 2022-05-08 ENCOUNTER — Other Ambulatory Visit: Payer: Self-pay | Admitting: Internal Medicine

## 2022-05-08 DIAGNOSIS — R928 Other abnormal and inconclusive findings on diagnostic imaging of breast: Secondary | ICD-10-CM

## 2022-05-18 ENCOUNTER — Ambulatory Visit
Admission: RE | Admit: 2022-05-18 | Discharge: 2022-05-18 | Disposition: A | Discharge status: Home / Self Care | Payer: BC Managed Care – PPO | Source: Ambulatory Visit | Attending: Internal Medicine | Admitting: Internal Medicine

## 2022-05-18 ENCOUNTER — Encounter (HOSPITAL_COMMUNITY): Payer: Self-pay

## 2022-05-18 DIAGNOSIS — R928 Other abnormal and inconclusive findings on diagnostic imaging of breast: Secondary | ICD-10-CM

## 2022-06-20 ENCOUNTER — Other Ambulatory Visit (HOSPITAL_COMMUNITY): Payer: Self-pay | Admitting: Nephrology

## 2022-06-20 DIAGNOSIS — Z7682 Awaiting organ transplant status: Secondary | ICD-10-CM

## 2022-07-17 ENCOUNTER — Telehealth: Payer: Self-pay | Admitting: *Deleted

## 2022-07-17 NOTE — Telephone Encounter (Signed)
Patient given instructions for upcoming stress echo.  Ricky Ala, RN

## 2022-07-23 ENCOUNTER — Telehealth (HOSPITAL_COMMUNITY): Payer: Self-pay | Admitting: Nephrology

## 2022-07-23 NOTE — Telephone Encounter (Signed)
Patient called and cancelled stress echocardiogram. She currently has shingles and will call back at a later date to reschedule. Order will be removed from the active echo WQ.

## 2022-07-24 ENCOUNTER — Other Ambulatory Visit (HOSPITAL_COMMUNITY): Payer: BC Managed Care – PPO

## 2022-07-25 ENCOUNTER — Ambulatory Visit (HOSPITAL_COMMUNITY): Payer: BC Managed Care – PPO

## 2022-08-01 ENCOUNTER — Ambulatory Visit (HOSPITAL_COMMUNITY): Payer: BC Managed Care – PPO

## 2022-10-09 IMAGING — CT CT ABD-PELV W/O CM
2 of 4 series · 16 of 46 positions shown, 18 images · non-contrast
Comparison: None

CLINICAL DATA: Suspected renal mass, buttock boil that patient
popped. History of hypertension, end-stage renal disease post renal
transplant, former smoker

EXAM:
CT ABDOMEN AND PELVIS WITHOUT CONTRAST
TECHNIQUE: Multidetector CT imaging of the abdomen and pelvis was performed
following the standard protocol without IV contrast. Sagittal and
coronal MPR images reconstructed from axial data set. No oral
contrast administered

[Series 3: a/p w/o 5mm · axial · non-contrast · 0.85mm/px · z∈[+818,+1193]mm · 13 of 87 slices shown, 15 images]
[im 6/87  soft-tissue]
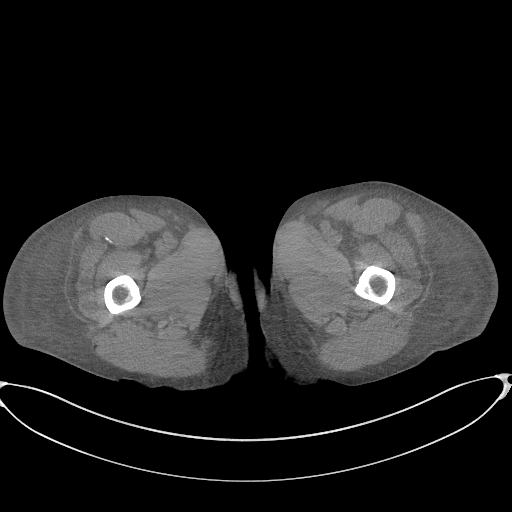
[im 6/87  bone]
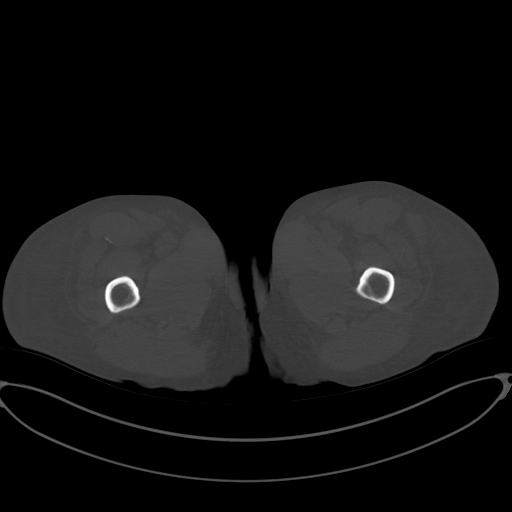
[im 12/87  soft-tissue]
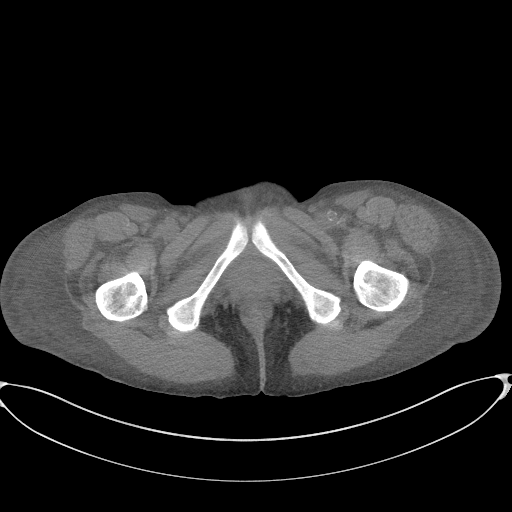
[im 18/87  soft-tissue]
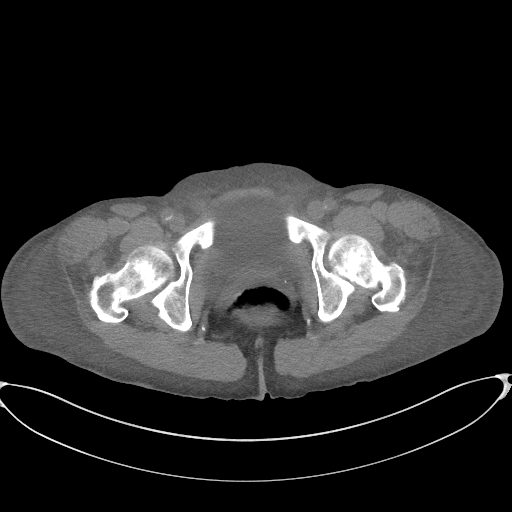
[im 23/87  soft-tissue]
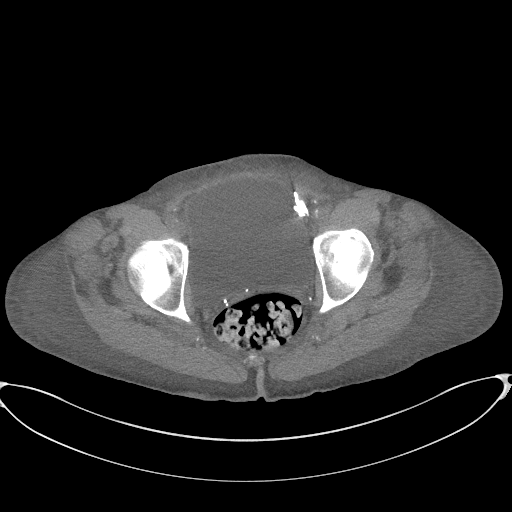
[im 29/87  soft-tissue]
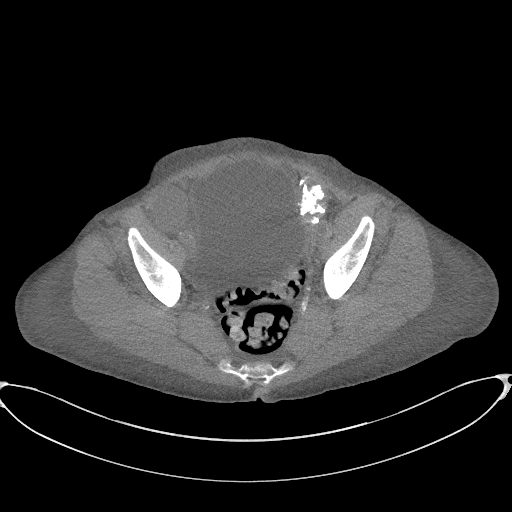
[im 35/87  soft-tissue]
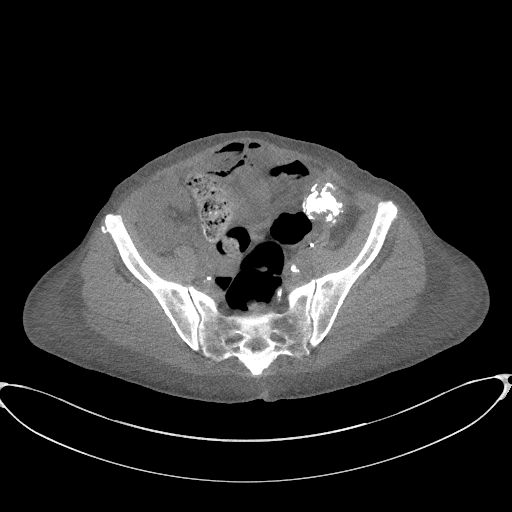
[im 46/87  soft-tissue]
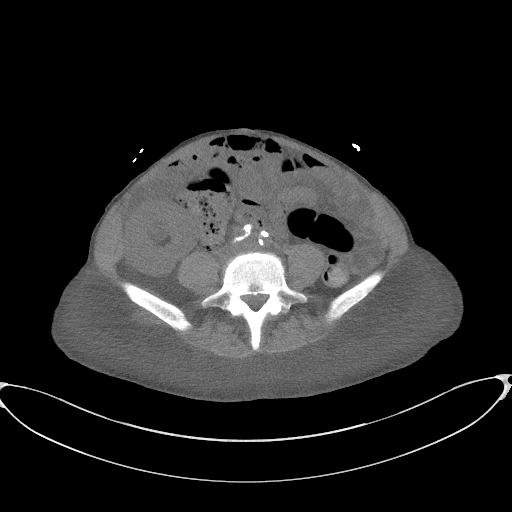
[im 52/87  soft-tissue]
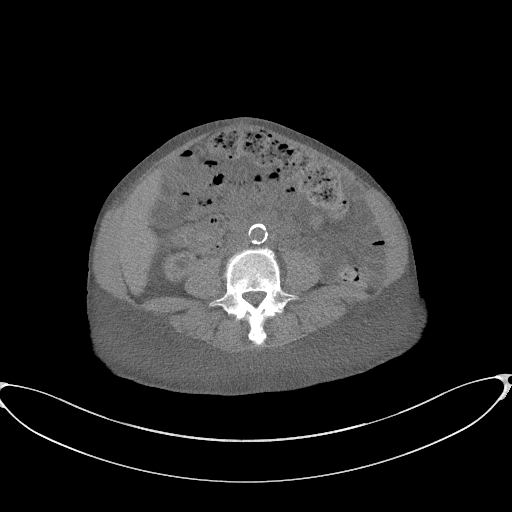
[im 58/87  soft-tissue]
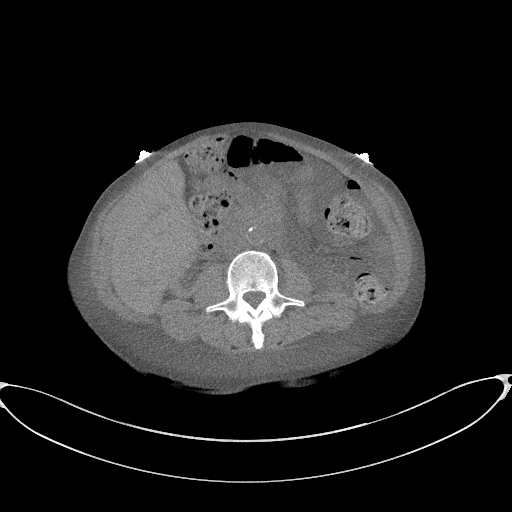
[im 58/87  bone]
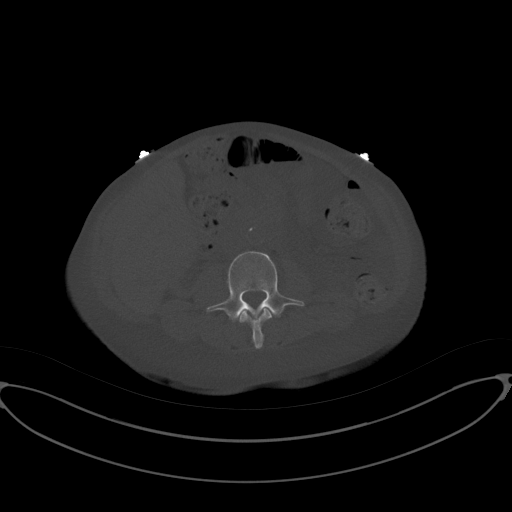
[im 64/87  soft-tissue]
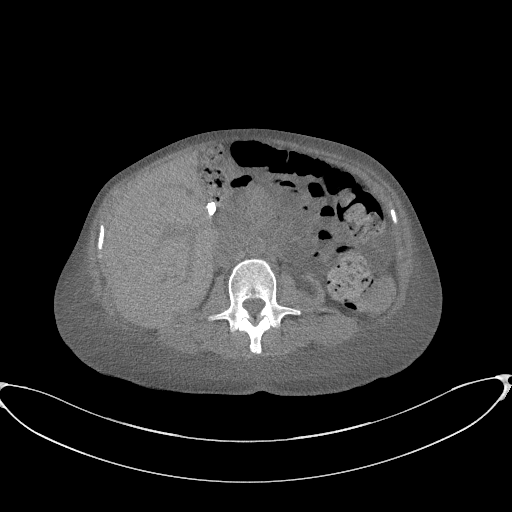
[im 69/87  soft-tissue]
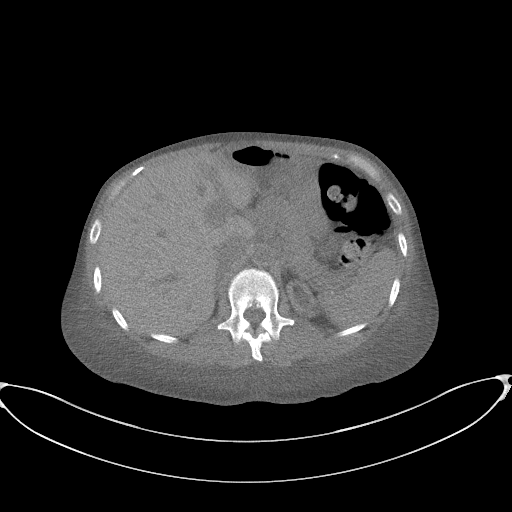
[im 75/87  soft-tissue]
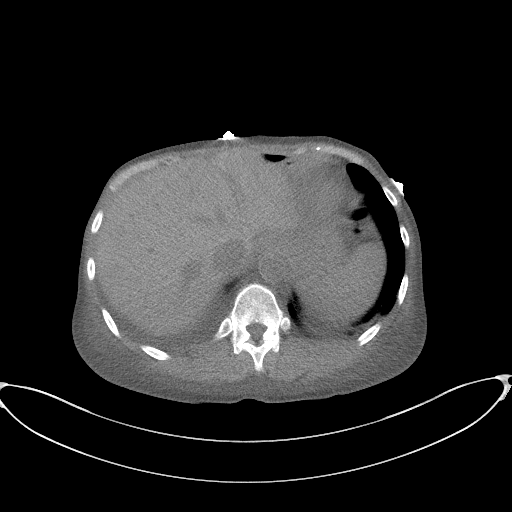
[im 81/87  soft-tissue]
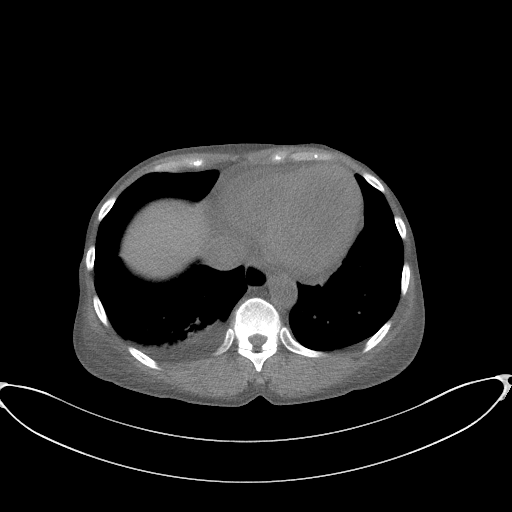

[Series 7: a/p w/o cor · coronal · non-contrast · 0.73mm/px · 3 of 149 slices shown]
[im 50/149  soft-tissue]
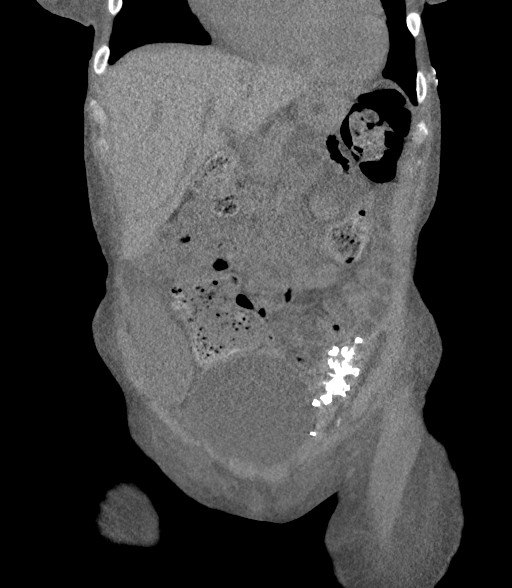
[im 66/149  soft-tissue]
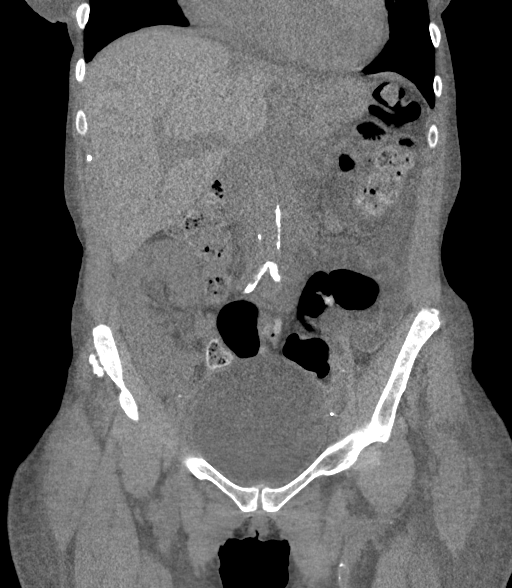
[im 83/149  soft-tissue]
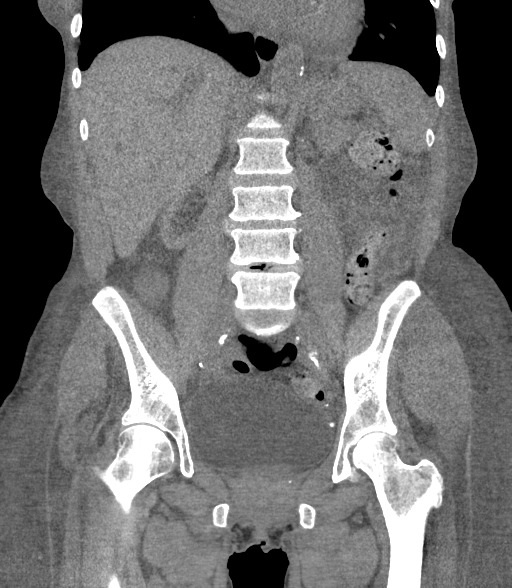

[16 of 46 positions shown; findings below may reference images not displayed]

FINDINGS: Lower chest: Trace LEFT pleural effusion and small RIGHT pleural
effusion with associated basilar atelectasis RIGHT greater than LEFT

Hepatobiliary: Upper normal hepatic attenuation. No focal hepatic
mass or nodularity. Gallbladder surgically absent.

Pancreas: Suboptimally visualized due to diffuse edema throughout
abdomen and subcutaneous soft tissues.

Spleen: Normal appearance

Adrenals/Urinary Tract: Atrophic native kidneys. Transplant kidney
RIGHT iliac fossa without mass identified by noncontrast CT. Bladder
unremarkable. Calcified mass in LEFT iliac fossa consistent with
failed transplant.

Stomach/Bowel: Stool in rectum and throughout colon. Stomach
decompressed. Bowel loops grossly unremarkable for suboptimal
technique

Vascular/Lymphatic: Extensive atherosclerotic calcifications aorta
and iliac arteries without aneurysm. Abnormal soft tissue LEFT
para-aortic question mildly enlarged lymph nodes, though this is
suboptimally assessed due to lack of IV contrast, diffuse soft
tissue edema and obscured tissue planes.

Reproductive: Nonvisualization of uterus and ovaries

Other: Diffuse soft tissue edema obscuring tissue planes, both in
abdominal wall and intra-abdominal/intrapelvic. No free air. No
hernia.

Musculoskeletal: Unremarkable
IMPRESSION: Atrophic native kidneys with transplant kidney RIGHT iliac fossa and
old calcified failed transplant kidney LEFT iliac fossa.

Diffuse soft tissue edema throughout abdomen and pelvis question
mildly enlarged LEFT para-aortic lymph nodes, though this is
suboptimally assessed due to lack of IV contrast, diffuse soft
tissue edema and obscured tissue planes.

Trace LEFT and small RIGHT pleural effusions with associated basilar
atelectasis RIGHT greater than LEFT.

No other definite intra-abdominal or intrapelvic abnormalities.

Aortic Atherosclerosis ([id number]).

## 2023-02-06 ENCOUNTER — Encounter (HOSPITAL_COMMUNITY): Payer: Self-pay

## 2023-02-07 ENCOUNTER — Encounter (HOSPITAL_COMMUNITY): Payer: Self-pay

## 2023-02-18 ENCOUNTER — Ambulatory Visit (INDEPENDENT_AMBULATORY_CARE_PROVIDER_SITE_OTHER): Payer: BC Managed Care – PPO | Admitting: Podiatry

## 2023-02-18 ENCOUNTER — Encounter: Payer: Self-pay | Admitting: Podiatry

## 2023-02-18 DIAGNOSIS — L600 Ingrowing nail: Secondary | ICD-10-CM

## 2023-02-18 DIAGNOSIS — L03032 Cellulitis of left toe: Secondary | ICD-10-CM | POA: Diagnosis not present

## 2023-02-18 DIAGNOSIS — I739 Peripheral vascular disease, unspecified: Secondary | ICD-10-CM | POA: Diagnosis not present

## 2023-02-18 MED ORDER — CEPHALEXIN 500 MG PO CAPS: 500.0000 mg | ORAL_CAPSULE | ORAL | 0 refills | Status: AC | PRN

## 2023-02-18 NOTE — Progress Notes (Signed)
  Subjective:  Patient ID: Karen [name], female    DOB: 04/02/1956,  MRN: [id number]  Chief Complaint  Patient presents with   Nail Problem    RM#21 Left big toe nail discolored very painful.    Discussed the use of AI scribe software for clinical note transcription with the patient, who gave verbal consent to proceed.  History of Present Illness   The patient, with a history of two kidney transplants and currently on dialysis, presents with left toenail pain that started about three to four weeks ago. The pain is intermittent, sometimes waking her up at night, and varies in intensity. She noticed a small amount of pus and bleeding from the back of the nail. She has been soaking the foot in apple cider vinegar as advised by her primary care doctor. She also reports a history of vascular issues, with a fistula in place for dialysis.          Objective:    Physical Exam   EXTREMITIES: Left hallux with pincer nail deformity, tenderness on medial and proximal nail folds, darkening of skin around nail, pulses non palpable. SKIN: Darkening of skin around nail.       No images are attached to the encounter.    Results          Assessment:   1. PAD (peripheral artery disease) (HCC)   2. Ingrowing left great toenail   3. Paronychia of great toe, left      Plan:  Patient was evaluated and treated and all questions answered.  Assessment and Plan    Infected Ingrown Toenail   She exhibits tenderness on the medial and proximal nail fold of the left hallux, accompanied by a pincer nail deformity. There is no active cellulitis or purulence, but the skin has darkened. We will start Cephalexin 500mg  after each dialysis session and continue Epsom salt soaks twice daily. A follow-up in two weeks is scheduled for possible nail removal if there is no improvement.  Peripheral Vascular Disease   Her foot pulses are non-palpable. We will order non-invasive vascular testing with ABI at the  [facility] in [place].          Return in about 2 weeks (around 03/04/2023) for nail re-check.

## 2023-02-18 NOTE — Patient Instructions (Signed)
Call [phone] to schedule your vascular testing:  [facility] [address] [place], [place] [address]

## 2023-03-04 ENCOUNTER — Encounter: Payer: Self-pay | Admitting: Podiatry

## 2023-03-04 ENCOUNTER — Ambulatory Visit (INDEPENDENT_AMBULATORY_CARE_PROVIDER_SITE_OTHER): Payer: BC Managed Care – PPO | Admitting: Podiatry

## 2023-03-04 DIAGNOSIS — L600 Ingrowing nail: Secondary | ICD-10-CM

## 2023-03-04 DIAGNOSIS — I739 Peripheral vascular disease, unspecified: Secondary | ICD-10-CM

## 2023-03-04 NOTE — Progress Notes (Signed)
  Subjective:  Patient ID: Karen [name], female    DOB: Mar 28, 1956,  MRN: [id number]  Chief Complaint  Patient presents with   Nail Problem    "It's a little better but the side and the top are still sore.  I didn't get a call about the test."      She notes that is doing better she is finished with the antibiotics         Objective:    Physical Exam   EXTREMITIES: No signs of infection of the medial border of left great toenail there are no signs of active infection, her pulses are nonpalpable      No images are attached to the encounter.    Results          Assessment:   Encounter Diagnoses  Name Primary?   Ingrowing left great toenail Yes   PAD (peripheral artery disease) (HCC)       Plan:  Patient was evaluated and treated and all questions answered.  Assessment and Plan    Infected Ingrown Toenail   Ingrown toenail has resolved, she has not had her vascular testing completed we will check on you need for authorization and scheduling for this.  Pending results may refer to vascular surgery for consultation.  Return to see me as needed.

## 2023-03-06 ENCOUNTER — Telehealth: Payer: Self-pay

## 2023-03-06 ENCOUNTER — Ambulatory Visit (HOSPITAL_COMMUNITY)
Admission: RE | Admit: 2023-03-06 | Discharge: 2023-03-06 | Disposition: A | Discharge status: Home / Self Care | Payer: BC Managed Care – PPO | Source: Ambulatory Visit | Attending: Podiatry | Admitting: Podiatry

## 2023-03-06 DIAGNOSIS — L03032 Cellulitis of left toe: Secondary | ICD-10-CM | POA: Insufficient documentation

## 2023-03-06 DIAGNOSIS — I739 Peripheral vascular disease, unspecified: Secondary | ICD-10-CM | POA: Diagnosis not present

## 2023-03-06 DIAGNOSIS — L600 Ingrowing nail: Secondary | ICD-10-CM | POA: Insufficient documentation

## 2023-03-06 LAB — VAS US PAD ABI

## 2023-03-06 NOTE — Telephone Encounter (Signed)
Called patient to notify - left detailed message on private voicemail advising that Vascular will be calling her to schedule and to please call me back with any questions or concerns. Thanks

## 2023-03-06 NOTE — Addendum Note (Signed)
Addended byLilian Kapur, Jiselle Sheu R on: 03/06/2023 08:28 AM   Modules accepted: Orders

## 2023-03-06 NOTE — Telephone Encounter (Signed)
-----   Message from Edwin Cap sent at 03/06/2023  8:28 AM EST ----- Can you let her know her results show fairly poor blood flow in the foot and I've sent an urgent referral to see vascular surgery?

## 2023-03-12 ENCOUNTER — Telehealth: Payer: Self-pay | Admitting: Podiatry

## 2023-03-12 MED ORDER — CEPHALEXIN 500 MG PO CAPS: 500.0000 mg | ORAL_CAPSULE | ORAL | 0 refills | Status: DC | PRN

## 2023-03-12 NOTE — Telephone Encounter (Signed)
Notified pt that rx was sent in and to take after dialysis session and she said she will and thank you so much

## 2023-03-12 NOTE — Telephone Encounter (Signed)
Pt called stating she cannot get into see the vascular until January and is asking if you could refill the medication you sent in last time Cephalexin 500mg  that it did help and she is still having the pain. The pharmacy is correct in the chart.

## 2023-03-14 NOTE — H&P (Addendum)
Chief Complaint: Pulling thrombus  Interval H&P  The patient has presented today for an angiogram/ angioplasty; patient is followed at [facility] with Dr. Signe Colt.  Various methods of treatment have been discussed with the patient.  After consideration of risk, benefits and other options for treatment, the patient has consented to a angiogram/ angioplasty with  possible stent placement.   Risks of angiogram with potential angioplasty and stenting if needed.contrast reaction, extravasation/ bleeding, dissection, hypotension and death were explained to the patient.  The patient's history has been reviewed and the patient has been examined, no changes in status.  Stable for angiogram/angioplasty  I have reviewed the patient's chart and labs.  Questions were answered to the patient's satisfaction.  Dialysis Orders:  [facility] 4h  400/ 600  55.2kg  2/2 bath  AVF  Hep 2400 - last HD 11/28, post wt 55.7kg - hectorol 1 ug IV tiw - venofer 100mg  tiw through 12/09 - mircera 100 q2, last 11/25  Assessment/Plan: ESRD dialyzing at [facility] TTS regimen with last dialysis Tuesday Hypopigmentation over an aneurysm - planning on angiogram with possibly angioplasty; fortunately no thrombus noted on ultrasound and the aneurysms.  Of note I am able to fully pinch the  hypopigmentated skin over the aneurysm and do not feel a defect underneath. Renal osteodystrophy - continue binders per home regimen. Anemia - managed with ESA's and IV iron at dialysis center. HTN - resume home regimen.   HPI: Karen [name] is an 67 y.o. female with a history of a failed renal transplant, CVA, hypertension, seizures, hyperlipidemia, ESRD dialyzing at [facility] on Tuesday Thursdays and Saturdays with last dialysis treatment on Thursday.  Patient being referred for decreased flows through her dialysis access.  Had revision of the left  brachiocephalic fistula on Aug 11, 2021 by Dr. Chestine Spore because of an aneurysmal segment with  thinning skin and a bleeding episode.  Patient being referred to evaluate for a lighter skin in the area over one of the aneurysms.   ROS Per HPI.  Chemistry and CBC: Creatinine, Ser  Date/Time Value Ref Range Status  02/28/2022 12:15 AM 4.36 (H) 0.44 - 1.00 mg/dL Final  16/01/9603 54:09 PM 3.67 (H) 0.44 - 1.00 mg/dL Final  81/19/1478 29:56 AM 3.20 (H) 0.44 - 1.00 mg/dL Final  21/30/8657 84:69 AM 3.66 (H) 0.44 - 1.00 mg/dL Final  62/95/2841 32:44 AM 3.46 (H) 0.44 - 1.00 mg/dL Final  04/04/7251 66:44 AM 2.71 (H) 0.44 - 1.00 mg/dL Final  03/47/4259 56:38 AM 3.26 (H) 0.44 - 1.00 mg/dL Final  75/64/3329 51:88 AM 3.95 (H) 0.44 - 1.00 mg/dL Final  41/66/0630 16:01 AM 5.48 (H) 0.44 - 1.00 mg/dL Final  09/32/3557 32:20 AM 5.70 (H) 0.44 - 1.00 mg/dL Final  25/42/7062 37:62 AM 3.89 (H) 0.44 - 1.00 mg/dL Final  83/15/1761 60:73 PM 1.63 (H) 0.44 - 1.00 mg/dL Final  71/08/2692 85:46 PM 1.14 (H) 0.50 - 1.10 mg/dL Final  27/06/5007 38:18 AM 0.95 0.50 - 1.10 mg/dL Final  29/93/7169 67:89 PM 4.2 (H) 0.4 - 1.2 mg/dL Final  38/12/1749 02:58 PM 7.89 (H)  Final  11/13/2006 10:02 AM 14.09 (H)  Final  11/12/2006 03:45 AM 12.01 (H)  Final  11/11/2006 06:05 AM 10.65 (H)  Final  11/10/2006 06:30 AM 9.68 (H)  Final  11/09/2006 11:30 PM 9.58 (H)  Final  10/27/2006 04:37 PM 9.15 (H)  Final   No results for input(s): "NA", "K", "CL", "CO2", "GLUCOSE", "BUN", "CREATININE", "CALCIUM", "PHOS" in the last 168  hours.  Invalid input(s): "ALB" No results for input(s): "WBC", "NEUTROABS", "HGB", "HCT", "MCV", "PLT" in the last 168 hours. Liver Function Tests: No results for input(s): "AST", "ALT", "ALKPHOS", "BILITOT", "PROT", "ALBUMIN" in the last 168 hours. No results for input(s): "LIPASE", "AMYLASE" in the last 168 hours. No results for input(s): "AMMONIA" in the last 168 hours. Cardiac Enzymes: No results for input(s): "CKTOTAL", "CKMB", "CKMBINDEX", "TROPONINI" in the last 168 hours. Iron Studies: No  results for input(s): "IRON", "TIBC", "TRANSFERRIN", "FERRITIN" in the last 72 hours. PT/INR: @LABRCNTIP (inr:5)  Xrays/Other Studies: )No results found for this or any previous visit (from the past 48 hours). No results found.  PMH:   Past Medical History:  Diagnosis Date   Anemia associated with chronic renal failure    Arthritis    Convulsions/seizures (HCC) 01/28/2013   Dyslipidemia    GERD (gastroesophageal reflux disease)    Hyperlipidemia    Hypertension    Kidney transplanted    Seizure disorder (HCC)    Stroke (HCC)    "possible mini stroke" years ago per patient- patient no longer sees neurologist    PSH:   Past Surgical History:  Procedure Laterality Date   CATHETER REMOVAL     COLONOSCOPY     GALLBLADDER SURGERY     INSERTION OF DIALYSIS CATHETER Left 08/11/2021   Procedure: INSERTION LEFT INTERNAL JUGULAR TUNNELED DIALYSIS CATHETER;  Surgeon: Cephus Shelling, MD;  Location: [facility] OR;  Service: Vascular;  Laterality: Left;   KIDNEY TRANSPLANT     04/10/2009   REVISON OF ARTERIOVENOUS FISTULA Right 08/11/2021   Procedure: RIGHT ARM ARTERIOVENOUS FISTULA REVISION WITH ANEURYSM REMOVAL;  Surgeon: Cephus Shelling, MD;  Location: [facility] OR;  Service: Vascular;  Laterality: Right;    Allergies: No Known Allergies  Medications:   Prior to Admission medications   Medication Sig Start Date End Date Taking? Authorizing Provider  aspirin EC 81 MG tablet Take 81 mg by mouth daily.    [provider]  calcitRIOL (ROCALTROL) 0.25 MCG capsule Take 0.25 mcg by mouth every Monday, Wednesday, and Friday.    [provider]  cephALEXin (KEFLEX) 500 MG capsule Take 1 capsule (500 mg total) by mouth every dialysis (following dialysis session). 03/12/23   Edwin Cap, DPM  Doxercalciferol (HECTOROL IV) Doxercalciferol (Hectorol) 02/16/23 02/15/24  [provider]  hydrALAZINE (APRESOLINE) 100 MG tablet Take 100 mg by mouth daily as needed (for  systolic blood pressure of 157 and above). Patient not taking: Reported on 02/18/2023    [provider]  magnesium oxide (MAG-OX) 400 MG tablet Take 400 mg by mouth 2 (two) times daily.     [provider]  omeprazole (PRILOSEC) 20 MG capsule Take 20 mg by mouth daily.    [provider]  predniSONE (DELTASONE) 5 MG tablet Take 5 mg by mouth daily.    [provider]  sevelamer carbonate (RENVELA) 800 MG tablet Take 800 mg by mouth 3 (three) times daily with meals. Take 1 tablet with snacks 02/21/22   [provider]  simvastatin (ZOCOR) 20 MG tablet Take 20 mg by mouth every evening.    [provider]  VENTOLIN HFA 108 (90 Base) MCG/ACT inhaler Inhale 1 puff into the lungs 3 times/day as needed-between meals & bedtime. 06/21/21   Arrien, York Ram, MD    Discontinued Meds:  There are no discontinued medications.  Social History:  reports that she has quit smoking. Her smoking use included cigarettes. She has never  been exposed to tobacco smoke. She has never used smokeless tobacco. She reports that she does not drink alcohol and does not use drugs.  Family History:   Family History  Problem Relation Age of Onset   Stroke Mother    Seizures Mother    Migraines Mother    Diabetes Brother    Colon cancer Neg Hx    Esophageal cancer Neg Hx    Stomach cancer Neg Hx    Rectal cancer Neg Hx     There were no vitals taken for this visit. General:well appearing female in NAD Heart:RRR, no mrg Lungs:CTAB, nml WOB on RA Abdomen:soft, NTND Extremities:no LE edema Dialysis Access: RU BCF, aneurysmal, able to pinch the hypopigmented skin and do not palpate a defect underneath        Ethelene Hal, MD 03/14/2023, 3:00 PM

## 2023-03-15 ENCOUNTER — Other Ambulatory Visit: Payer: Self-pay

## 2023-03-15 ENCOUNTER — Encounter (HOSPITAL_COMMUNITY)
Admission: RE | Disposition: A | Discharge status: Home / Self Care | Payer: Self-pay | Source: Home / Self Care | Attending: Nephrology

## 2023-03-15 ENCOUNTER — Ambulatory Visit (HOSPITAL_COMMUNITY)
Admission: RE | Admit: 2023-03-15 | Discharge: 2023-03-15 | Disposition: A | Discharge status: Home / Self Care | Payer: BC Managed Care – PPO | Attending: Nephrology | Admitting: Nephrology

## 2023-03-15 ENCOUNTER — Encounter (HOSPITAL_COMMUNITY): Payer: Self-pay | Admitting: Nephrology

## 2023-03-15 DIAGNOSIS — T82858A Stenosis of vascular prosthetic devices, implants and grafts, initial encounter: Secondary | ICD-10-CM | POA: Insufficient documentation

## 2023-03-15 DIAGNOSIS — E785 Hyperlipidemia, unspecified: Secondary | ICD-10-CM | POA: Insufficient documentation

## 2023-03-15 DIAGNOSIS — Z8673 Personal history of transient ischemic attack (TIA), and cerebral infarction without residual deficits: Secondary | ICD-10-CM | POA: Diagnosis not present

## 2023-03-15 DIAGNOSIS — I120 Hypertensive chronic kidney disease with stage 5 chronic kidney disease or end stage renal disease: Secondary | ICD-10-CM | POA: Insufficient documentation

## 2023-03-15 DIAGNOSIS — D631 Anemia in chronic kidney disease: Secondary | ICD-10-CM | POA: Insufficient documentation

## 2023-03-15 DIAGNOSIS — Z940 Kidney transplant status: Secondary | ICD-10-CM | POA: Diagnosis not present

## 2023-03-15 DIAGNOSIS — Z992 Dependence on renal dialysis: Secondary | ICD-10-CM | POA: Insufficient documentation

## 2023-03-15 DIAGNOSIS — Z87891 Personal history of nicotine dependence: Secondary | ICD-10-CM | POA: Diagnosis not present

## 2023-03-15 DIAGNOSIS — Z79899 Other long term (current) drug therapy: Secondary | ICD-10-CM | POA: Diagnosis not present

## 2023-03-15 DIAGNOSIS — Y832 Surgical operation with anastomosis, bypass or graft as the cause of abnormal reaction of the patient, or of later complication, without mention of misadventure at the time of the procedure: Secondary | ICD-10-CM | POA: Insufficient documentation

## 2023-03-15 DIAGNOSIS — R569 Unspecified convulsions: Secondary | ICD-10-CM | POA: Diagnosis not present

## 2023-03-15 DIAGNOSIS — N250 Renal osteodystrophy: Secondary | ICD-10-CM | POA: Diagnosis not present

## 2023-03-15 DIAGNOSIS — N186 End stage renal disease: Secondary | ICD-10-CM | POA: Insufficient documentation

## 2023-03-15 HISTORY — PX: A/V FISTULAGRAM: CATH118298

## 2023-03-15 SURGERY — A/V FISTULAGRAM
Anesthesia: LOCAL

## 2023-03-15 MED ORDER — FENTANYL CITRATE (PF) 100 MCG/2ML IJ SOLN
INTRAMUSCULAR | Status: AC
  Filled 2023-03-15: qty 2

## 2023-03-15 MED ORDER — HEPARIN (PORCINE) IN NACL 1000-0.9 UT/500ML-% IV SOLN
INTRAVENOUS | Status: DC | PRN
  Administered 2023-03-15: 500 mL

## 2023-03-15 MED ORDER — FENTANYL CITRATE (PF) 100 MCG/2ML IJ SOLN
INTRAMUSCULAR | Status: DC | PRN
  Administered 2023-03-15: 25 ug via INTRAVENOUS

## 2023-03-15 MED ORDER — LIDOCAINE HCL (PF) 1 % IJ SOLN
INTRAMUSCULAR | Status: AC
  Filled 2023-03-15: qty 30

## 2023-03-15 MED ORDER — LIDOCAINE HCL (PF) 1 % IJ SOLN
INTRAMUSCULAR | Status: DC | PRN
  Administered 2023-03-15: 5 mL via INTRADERMAL

## 2023-03-15 MED ORDER — IODIXANOL 320 MG/ML IV SOLN
INTRAVENOUS | Status: DC | PRN
  Administered 2023-03-15: 15 mL

## 2023-03-15 MED ORDER — MIDAZOLAM HCL 2 MG/2ML IJ SOLN
INTRAMUSCULAR | Status: DC | PRN
  Administered 2023-03-15: 1 mg via INTRAVENOUS

## 2023-03-15 MED ORDER — MIDAZOLAM HCL 2 MG/2ML IJ SOLN
INTRAMUSCULAR | Status: AC
  Filled 2023-03-15: qty 2

## 2023-03-15 SURGICAL SUPPLY — 9 items
BAG SNAP BAND KOVER 36X36 (MISCELLANEOUS) ×2 IMPLANT
BALLN MUSTANG 12.0X40 75 (BALLOONS) ×1
BALLOON MUSTANG 12.0X40 75 (BALLOONS) IMPLANT
CATH BEACON 5 .035 65 KMP TIP (CATHETERS) IMPLANT
COVER DOME SNAP 22 D (MISCELLANEOUS) ×2 IMPLANT
GUIDEWIRE ANGLED .035X150CM (WIRE) IMPLANT
SHEATH PINNACLE R/O II 7F 4CM (SHEATH) IMPLANT
SYR MEDALLION 10ML (SYRINGE) IMPLANT
TRAY PV CATH (CUSTOM PROCEDURE TRAY) ×2 IMPLANT

## 2023-03-15 NOTE — Op Note (Signed)
Patient presents with decreased access flows of her  right  BCF.  This is her for procedure with [place] kidney.    On examination, the brachial cephalic fistula is aneurysmal and hyperpulsatile worse towards the outflow.   Hypopigmented region of the skin is able to be pinched with no defect noted underneath.     Summary:  1)      The patient had successful angioplasty (12mm Mustang FE ~16 atm) of significant stenosis in the outflow cephalic vein well short of the anastomosis.  The fistula is very large caliber and on the order of 12 the 16 mm in diameter and tortuous as well.   2)      The aneurysmal body of the cephalic vein megafistula was patent with good flows. 3)      The arch is massive but there was no evidence of any confluence stenosis; centrals were widely patent.  Able to visualize and follow given the size of the fistula. 4)      This right BCF remains amenable to future percutaneous intervention not a declot given how large it is.   Description of procedure: The arm was prepped and draped in the usual sterile fashion. The right upper arm brachial cephalic fistula was cannulated (47829) with an 18G Angiocath needle directed in an antegrade direction. A guidewire was inserted and exchanged for a 7Fr sheath. Contrast [phone]) injection via the side port of the sheath was performed. The angiogram of the fistula (08657) showed an aneurysmal fortuitous body of the right BCF, patent outflow cephalic vein for a focal 70% stenosis approximately 4 inches from the arch confluence, huge arch but patent confluence with contrast seen draining into the central veins.  Possibly 30% innominate vein stenosis but no retrograde flow through the axillary vein.   Unable to visualize the inflow anastomosis because of the huge caliber of this Mega fistula.     The wire was advanced to the arch. A  12 mm Mustangangioplasty balloon was inserted over a glide wire and positioned at the cephalic vein arch stenosis.  Venous angioplasty (84696) was carried out to 18 ATM with FULL effacement of the waist on the balloon at the arch lesion.   Repeat angiogram showed 30-50% residual at the site of angioplasty with no evidence of extravasation.  The flow of contrast was quicker and the fistula was somewhat less pulsatile.   Hemostasis: A 3-0 ethilon purse string suture was placed at the cannulation site on removal of the sheath.   Sedation: 1 mg Versed, 25 mcg Fentanyl.   Contrast. 15 mL   Monitoring: Because of the patient's comorbid conditions and sedation during the procedure, continuous EKG monitoring and O2 saturation monitoring was performed throughout the procedure by the RN. There were no abnormal arrhythmias encountered.   Complications: None.    Diagnoses: I87.1 Stricture of vein  N18.6 ESRD T82.858A Stricture of access   Procedure Coding:  [phone] Cannulation and angiogram of fistula, venous angioplasty (cephalic vein arch) U1324 Contrast   Recommendations:  1. Continue to cannulate the fistula with 15G needles.  2. Refer back for problems with flows. 3. Remove the suture next treatment.    Discharge: The patient was discharged home in stable condition. The patient was given education regarding the c

## 2023-03-15 NOTE — Discharge Instructions (Signed)

## 2023-03-16 IMAGING — US US RENAL TRANSPLANT
1 series · 13 of 25 positions shown · non-contrast
Comparison: None.

CLINICAL DATA: Acute kidney injury

EXAM:
ULTRASOUND OF RENAL TRANSPLANT WITH RENAL DOPPLER ULTRASOUND
TECHNIQUE: Ultrasound examination of the renal transplant was performed with
gray-scale, color and duplex doppler evaluation.

[Series 1: us renal transplant w/doppler · 13 of 43 slices shown]
[im 1/43]
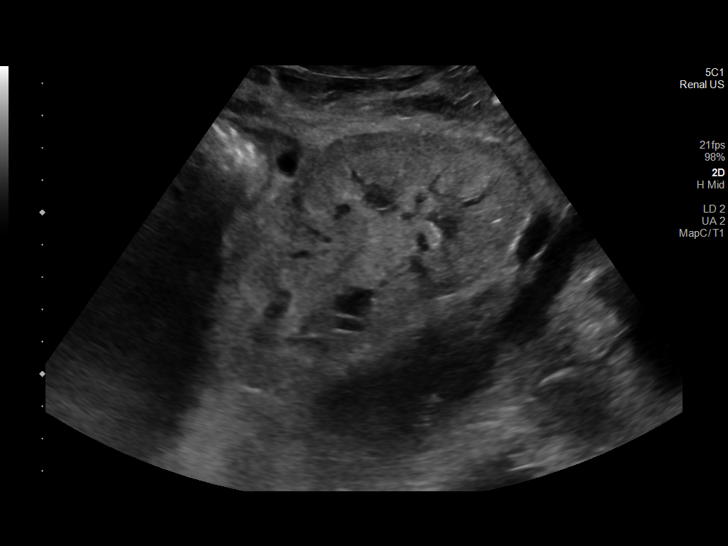
[im 4/43]
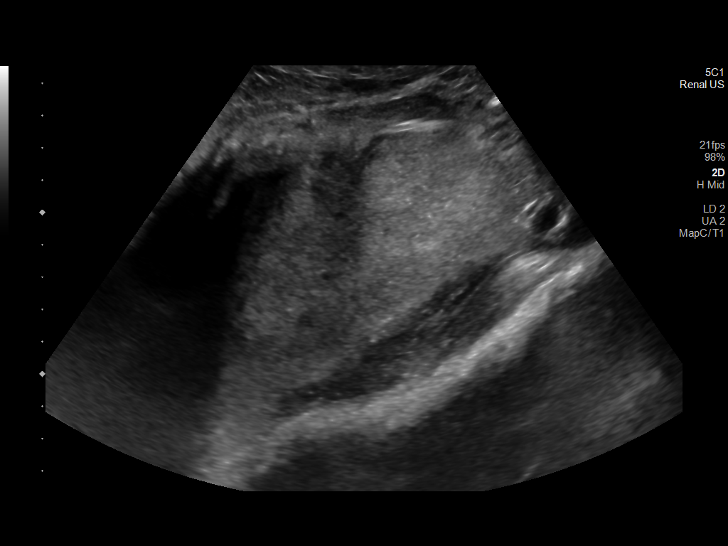
[im 8/43]
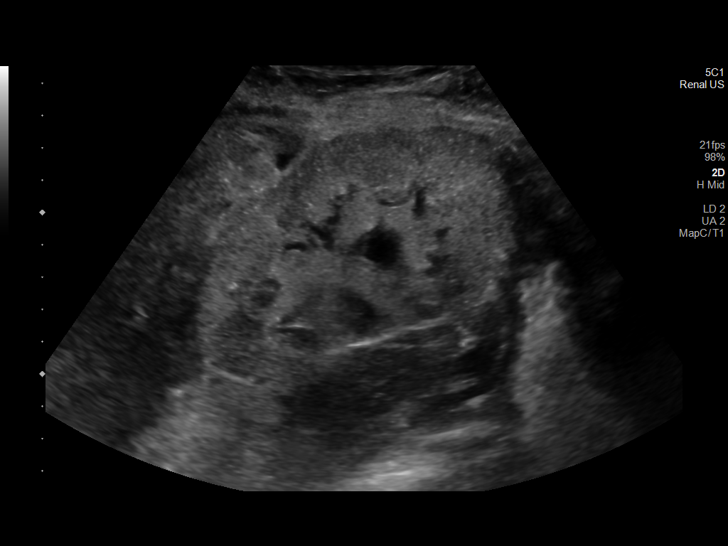
[im 11/43]
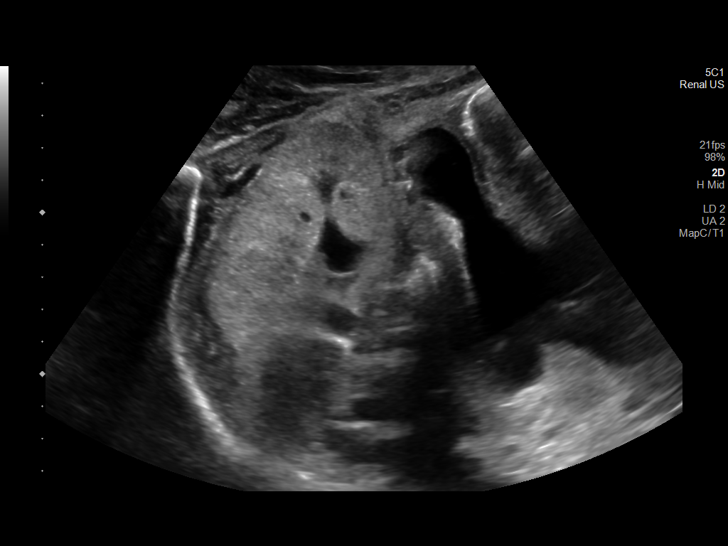
[im 15/43]
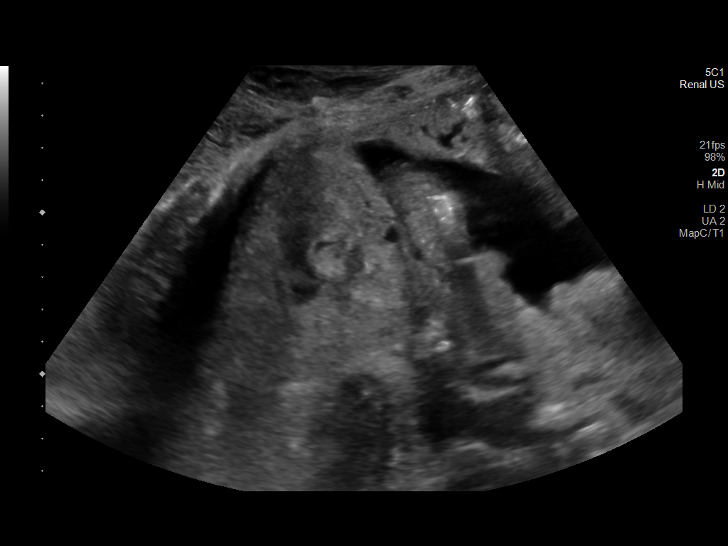
[im 18/43]
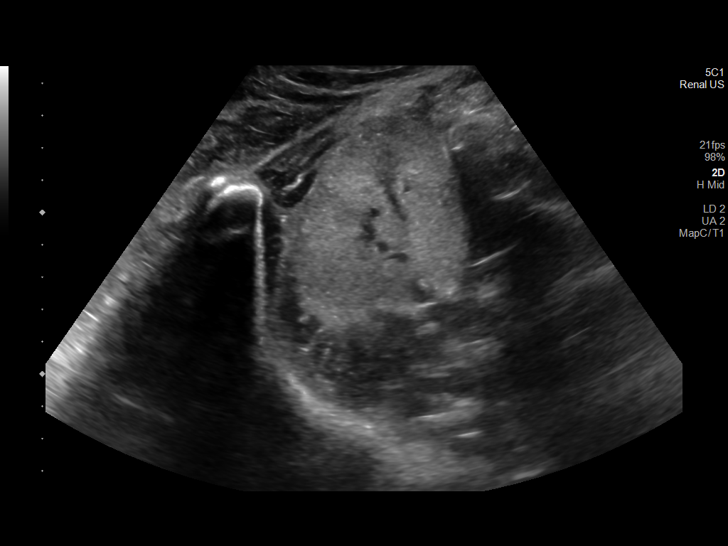
[im 22/43]
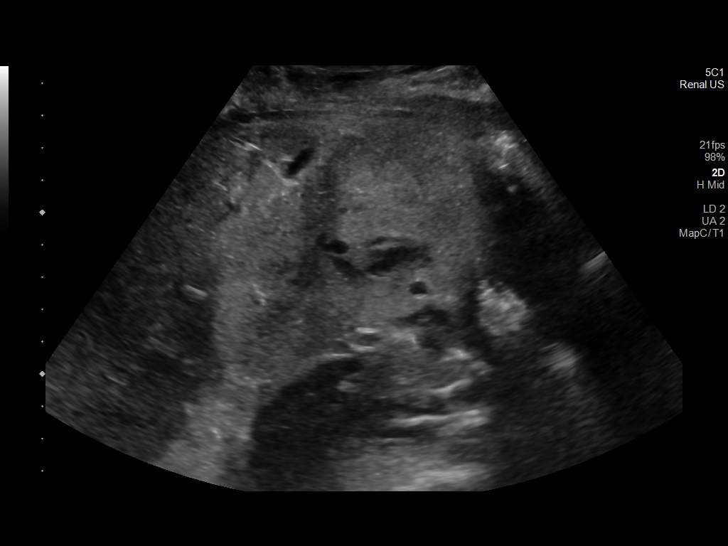
[im 25/43]
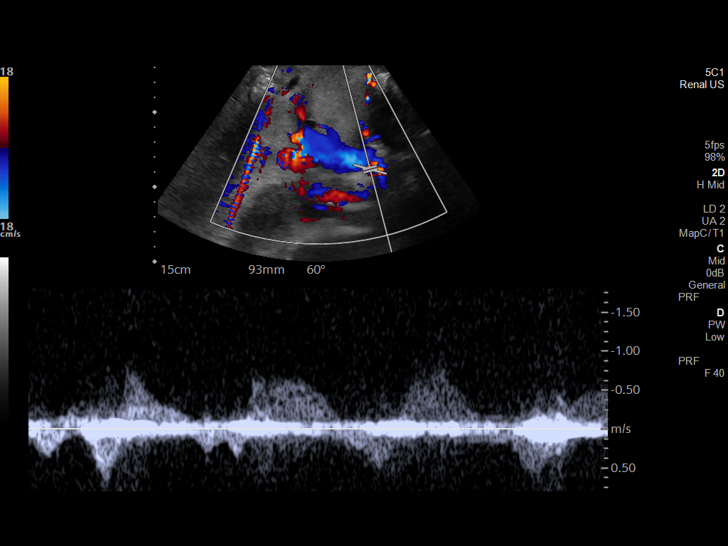
[im 29/43]
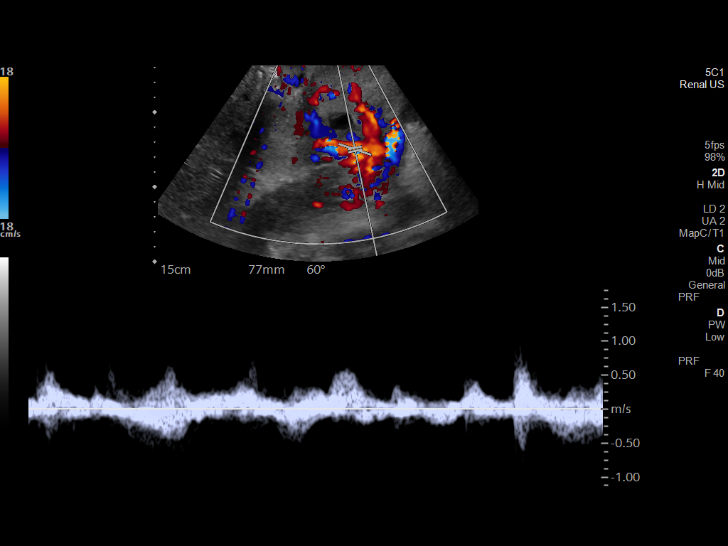
[im 32/43]
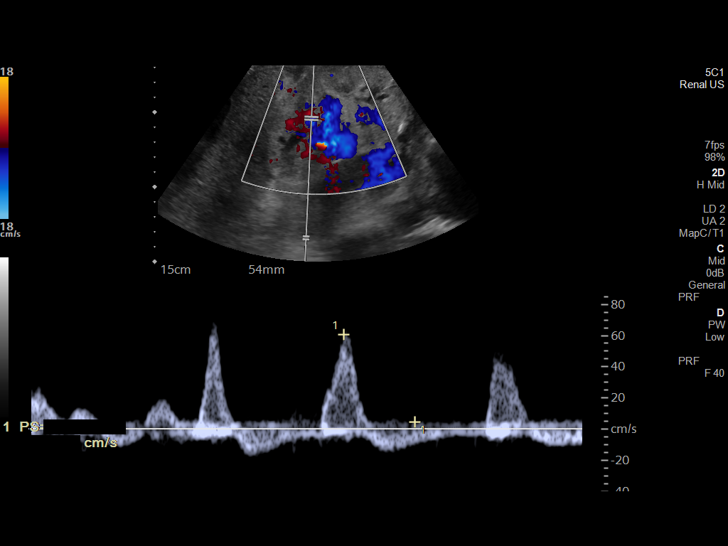
[im 36/43]
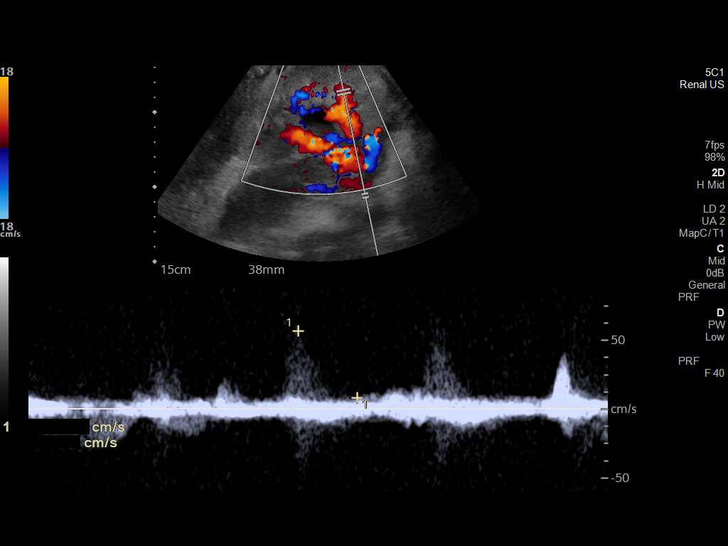
[im 39/43]
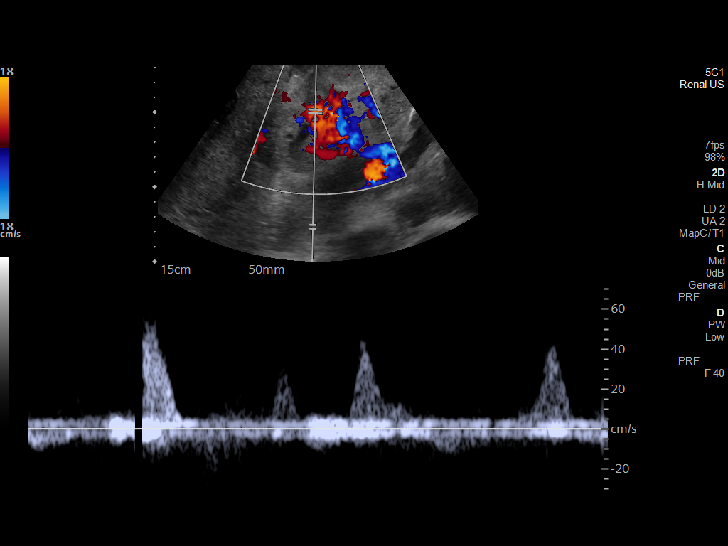
[im 43/43]
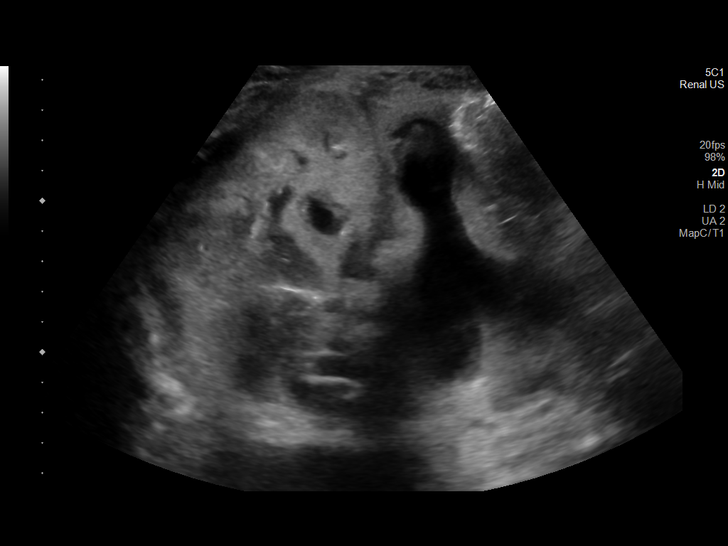

[13 of 25 positions shown; findings below may reference images not displayed]

FINDINGS: Transplant kidney location: RLQ

Transplant Kidney:

Renal measurements: 10.9 x 6.3 x 5.1 cm = volume: 185mL. Normal in
size. Diffusely echogenic renal parenchyma. Mild hydronephrosis.

Color flow in the main renal artery:  Yes

Color flow in the main renal vein:  Yes

Duplex Doppler Evaluation:

Main Renal Artery Velocity: 217 cm/second cm/sec

Main Renal Artery Resistive Index:

Venous waveform in main renal vein:  Present

Intrarenal resistive index in upper pole:

(normal 0.6-0.8; equivocal 0.8-0.9; abnormal >= 0.9)

Intrarenal resistive index in lower pole:

(normal 0.6-0.8; equivocal 0.8-0.9; abnormal >= 0.9)

Bladder: Not visualized

Other findings:  Small volume ascites.
IMPRESSION: 1. Abnormal exam.
2. Elevated resistive indices greater than 0.9. Differential
considerations include ATN, transplant rejection and drug toxicity.
3. Very mild hydronephrosis.
4. Diffusely echogenic renal parenchyma.

## 2023-03-16 IMAGING — DX DG CHEST 1V PORT
1 series · 1 of 1 positions shown · non-contrast
Comparison: 11/29/2006

CLINICAL DATA: Shortness of breath

EXAM:
PORTABLE CHEST 1 VIEW

[chest]
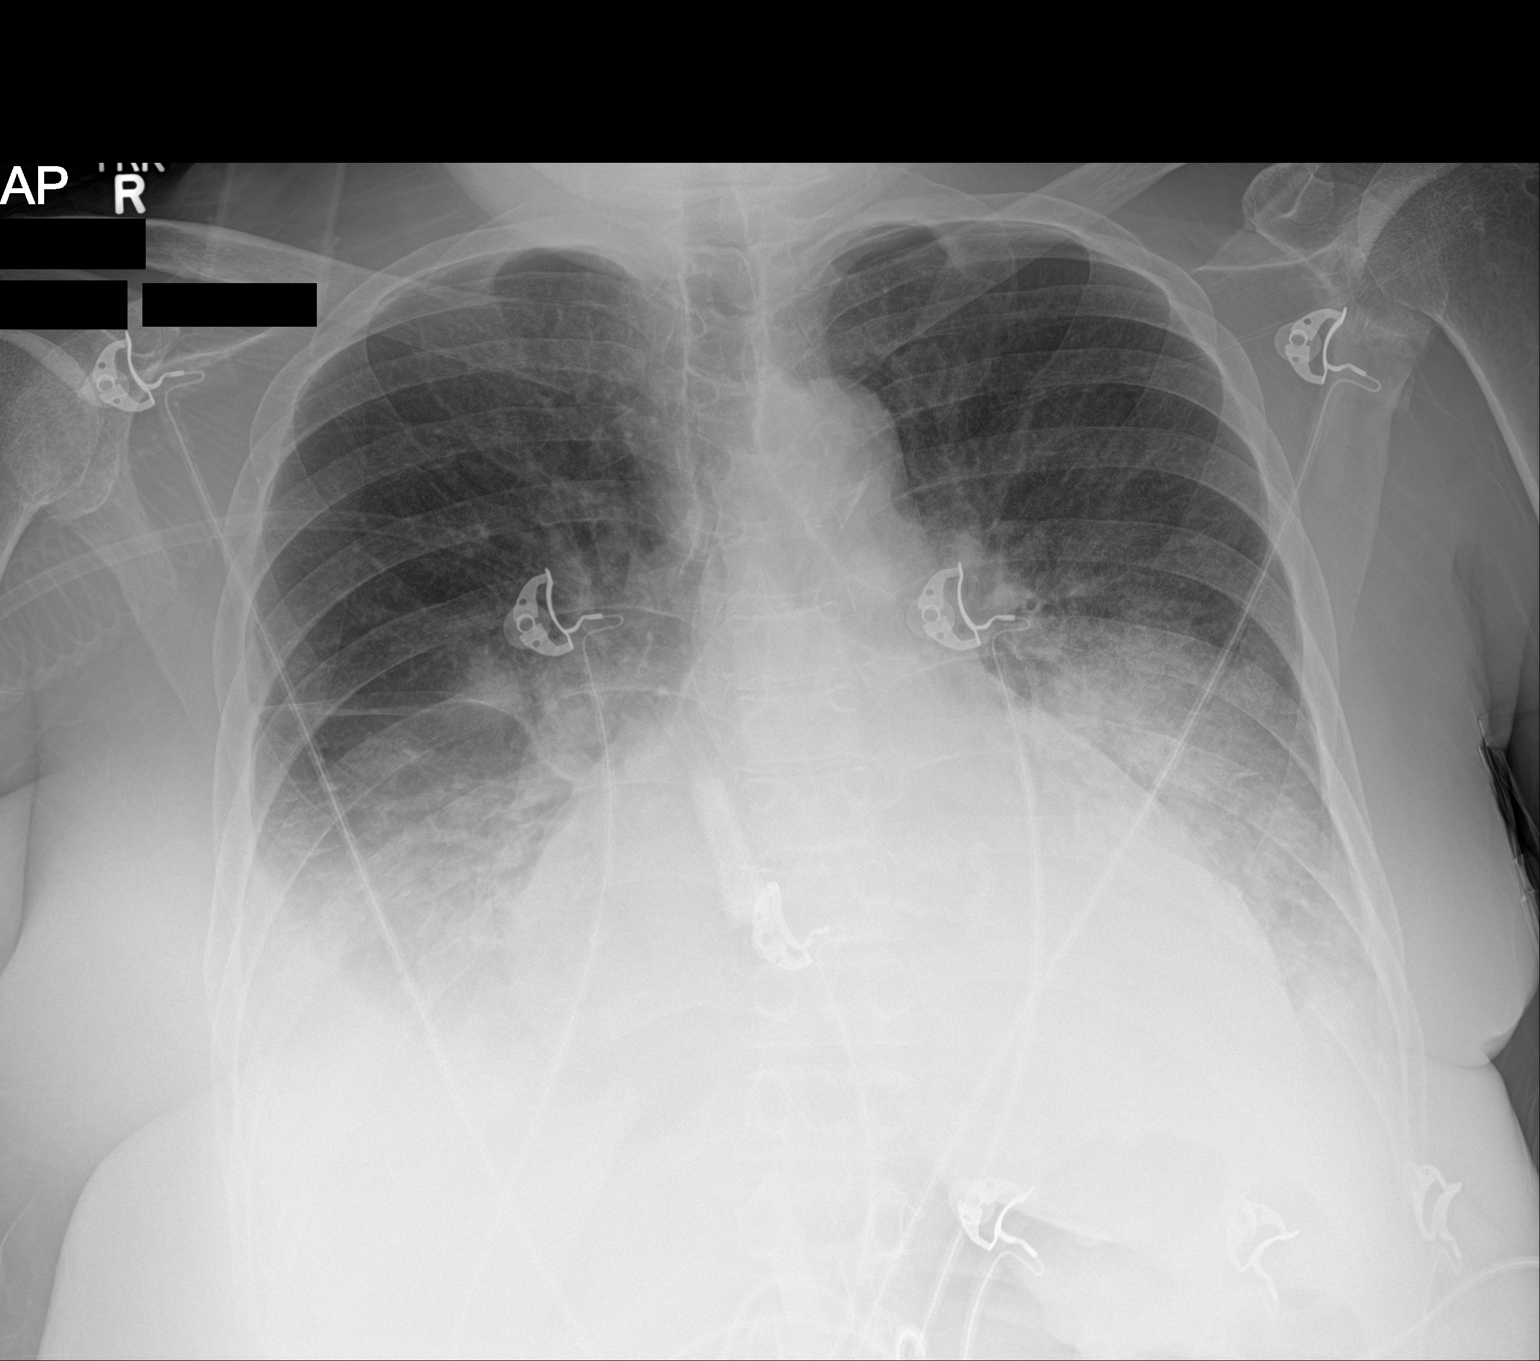

[1 of 1 positions shown; findings below may reference images not displayed]

FINDINGS: Gross cardiomegaly. Layering bilateral pleural effusions and
associated atelectasis or consolidation. Osseous structures are
unremarkable.
IMPRESSION: Gross cardiomegaly. Layering bilateral pleural effusions and
associated atelectasis or consolidation.

## 2023-04-15 NOTE — Progress Notes (Signed)
 Patient name: Karen [name] MRN: [id number] DOB: September 20, 1955 Sex: female  REASON FOR CONSULT: Scab getting longer and thinner over AV access/PAD with abnormal ABI  HPI: Karen [name] is a 68 y.o. female, with history of hypertension, hyperlipidemia, CVA, ESRD  that presents for evaluation of scab getting longer and thinner over AV access also referred for PAD by podiatry.  As it relates to her AV fistula she has a right brachiocephalic.  This last underwent revision on 08/11/2021 with excision of the aneurysm and primary repair.  She did have a fistulogram on 03/15/2023 with angioplasty of cephalic vein outflow stenosis.  The referral for PAD is from Dr. Silva with podiatry.  He states left ingrown toenail.  She did have ABIs on 03/06/2023 that were noncompressible monophasic at the ankle with toe pressure of 0.  States the wound has been present for several months.  States the left great toenail has been draining some pus.  Past Medical History:  Diagnosis Date   Anemia associated with chronic renal failure    Arthritis    Convulsions/seizures (HCC) 01/28/2013   Dyslipidemia    GERD (gastroesophageal reflux disease)    Hyperlipidemia    Hypertension    Kidney transplanted    Seizure disorder [facility])    Stroke (HCC)    possible mini stroke years ago per patient- patient no longer sees neurologist    Past Surgical History:  Procedure Laterality Date   A/V FISTULAGRAM N/A 03/15/2023   Procedure: A/V Fistulagram;  Surgeon: Melia Lynwood ORN, MD;  Location[facility] INVASIVE CV LAB;  Service: Cardiovascular;  Laterality: N/A;   CATHETER REMOVAL     COLONOSCOPY     GALLBLADDER SURGERY     INSERTION OF DIALYSIS CATHETER Left 08/11/2021   Procedure: INSERTION LEFT INTERNAL JUGULAR TUNNELED DIALYSIS CATHETER;  Surgeon: Gretta Lonni PARAS, MD;  Location: [facility] OR;  Service: Vascular;  Laterality: Left;   KIDNEY TRANSPLANT     04/10/2009   REVISON OF ARTERIOVENOUS FISTULA Right 08/11/2021    Procedure: RIGHT ARM ARTERIOVENOUS FISTULA REVISION WITH ANEURYSM REMOVAL;  Surgeon: Gretta Lonni PARAS, MD;  Location: [facility] OR;  Service: Vascular;  Laterality: Right;    Family History  Problem Relation Age of Onset   Stroke Mother    Seizures Mother    Migraines Mother    Diabetes Brother    Colon cancer Neg Hx    Esophageal cancer Neg Hx    Stomach cancer Neg Hx    Rectal cancer Neg Hx     SOCIAL HISTORY: Social History   Socioeconomic History   Marital status: Single    Spouse name: Not on file   Number of children: 1   Years of education: 12   Highest education level: Not on file  Occupational History   Occupation: CNA  Tobacco Use   Smoking status: Former    Types: Cigarettes    Passive exposure: Never   Smokeless tobacco: Never   Tobacco comments:    quit smoking 25 yrs ago  Substance and Sexual Activity   Alcohol use: No    Comment: quit 25 years ago   Drug use: No   Sexual activity: Not on file  Other Topics Concern   Not on file  Social History Narrative   Right handed   No caffeine   One story home   Social Drivers of Health   Financial Resource Strain: Not on file  Food Insecurity: No Food Insecurity (02/28/2022)   Hunger Vital Sign  Worried About Programme Researcher, Broadcasting/film/video in the Last Year: Never true    Ran Out of Food in the Last Year: Never true  Transportation Needs: No Transportation Needs (02/28/2022)   PRAPARE - Administrator, Civil Service (Medical): No    Lack of Transportation (Non-Medical): No  Physical Activity: Not on file  Stress: Not on file  Social Connections: Not on file  Intimate Partner Violence: Not At Risk (02/28/2022)   Humiliation, Afraid, Rape, and Kick questionnaire    Fear of Current or Ex-Partner: No    Emotionally Abused: No    Physically Abused: No    Sexually Abused: No    No Known Allergies  Current Outpatient Medications  Medication Sig Dispense Refill   aspirin  EC 81 MG tablet Take 81 mg by  mouth daily.     calcitRIOL  (ROCALTROL ) 0.25 MCG capsule Take 0.25 mcg by mouth every Monday, Wednesday, and Friday.     cephALEXin  (KEFLEX ) 500 MG capsule Take 1 capsule (500 mg total) by mouth every dialysis (following dialysis session). 6 capsule 0   Doxercalciferol (HECTOROL IV) Doxercalciferol (Hectorol)     hydrALAZINE  (APRESOLINE ) 100 MG tablet Take 100 mg by mouth daily as needed (for systolic blood pressure of 157 and above). (Patient not taking: Reported on 02/18/2023)     magnesium  oxide (MAG-OX) 400 MG tablet Take 400 mg by mouth 2 (two) times daily.      omeprazole (PRILOSEC) 20 MG capsule Take 20 mg by mouth daily.     predniSONE  (DELTASONE ) 5 MG tablet Take 5 mg by mouth daily.     sevelamer  carbonate (RENVELA ) 800 MG tablet Take 800 mg by mouth 3 (three) times daily with meals. Take 1 tablet with snacks     simvastatin  (ZOCOR ) 20 MG tablet Take 20 mg by mouth every evening.     VENTOLIN  HFA 108 (90 Base) MCG/ACT inhaler Inhale 1 puff into the lungs 3 times/day as needed-between meals & bedtime.     No current facility-administered medications for this visit.    REVIEW OF SYSTEMS:  [X]  denotes positive finding, [ ]  denotes negative finding Cardiac  Comments:  Chest pain or chest pressure:    Shortness of breath upon exertion:    Short of breath when lying flat:    Irregular heart rhythm:        Vascular    Pain in calf, thigh, or hip brought on by ambulation:    Pain in feet at night that wakes you up from your sleep:     Blood clot in your veins:    Leg swelling:         Pulmonary    Oxygen at home:    Productive cough:     Wheezing:         Neurologic    Sudden weakness in arms or legs:     Sudden numbness in arms or legs:     Sudden onset of difficulty speaking or slurred speech:    Temporary loss of vision in one eye:     Problems with dizziness:         Gastrointestinal    Blood in stool:     Vomited blood:         Genitourinary    Burning when  urinating:     Blood in urine:        Psychiatric    Major depression:         Hematologic  Bleeding problems:    Problems with blood clotting too easily:        Skin    Rashes or ulcers:        Constitutional    Fever or chills:      PHYSICAL EXAM: There were no vitals filed for this visit.  GENERAL: The patient is a well-nourished female, in no acute distress. The vital signs are documented above. CARDIAC: There is a regular rate and rhythm.  VASCULAR:  Bilateral common femoral pulses palpable No palpable pedal pulses Left great toe as pictured PULMONARY: No respiratory distress. ABDOMEN: Soft and non-tender. MUSCULOSKELETAL: There are no major deformities or cyanosis. NEUROLOGIC: No focal weakness or paresthesias are detected. PSYCHIATRIC: The patient has a normal affect.    DATA:   ABIs 03/06/2023 noncompressible and monophasic with toe pressures of 0  Assessment/Plan:  68 y.o. female, with history of hypertension, hyperlipidemia, CVA, ESRD that presents for evaluation of scab getting longer and thinner over AV access and also referred for PAD by podiatry.  As it relates to her AV fistula she has a right brachiocephalic.  This last underwent revision on 08/11/2021 with excision of the aneurysm and primary repair.  She did have a fistulogram on 03/15/2023 with angioplasty of cephalic vein outflow stenosis.  I do not think the fistula requires any revision at this time.  She has some hypopigmented skin but it looks otherwise intact without frank ulceration.  She states it is working well at this time.  The referral for PAD is from Dr. Silva with podiatry.  She has a left ingrown toenail with some drainage as pictured above.  She did have ABIs on 03/06/2023 that were noncompressible monophasic at the ankle with toe pressure of 0.  States the wound has been present for several months.  I do not palpate any pedal pulses and she has a toe pressure of 0.  I have recommended  aortogram, lower extremity arteriogram with possible intervention with a focus on the left leg.  Risk benefits discussed including risk of transfemoral access.  Will get scheduled at [facility].   Lonni DOROTHA Gaskins, MD [facility] [place] Office: [phone]

## 2023-04-16 ENCOUNTER — Other Ambulatory Visit: Payer: Self-pay

## 2023-04-16 ENCOUNTER — Ambulatory Visit (INDEPENDENT_AMBULATORY_CARE_PROVIDER_SITE_OTHER): Payer: BC Managed Care – PPO | Admitting: Vascular Surgery

## 2023-04-16 ENCOUNTER — Encounter: Payer: Self-pay | Admitting: Vascular Surgery

## 2023-04-16 VITALS — BP 103/45 | HR 65 | Temp 97.4°F | Ht 60.0 in | Wt 138.1 lb

## 2023-04-16 DIAGNOSIS — Z992 Dependence on renal dialysis: Secondary | ICD-10-CM | POA: Diagnosis not present

## 2023-04-16 DIAGNOSIS — N186 End stage renal disease: Secondary | ICD-10-CM | POA: Diagnosis not present

## 2023-04-16 DIAGNOSIS — I70222 Atherosclerosis of native arteries of extremities with rest pain, left leg: Secondary | ICD-10-CM

## 2023-04-16 MED ORDER — SODIUM CHLORIDE 0.9% FLUSH: 3.0000 mL | Freq: Two times a day (BID) | INTRAVENOUS | Status: AC

## 2023-04-16 MED ORDER — SODIUM CHLORIDE 0.9% FLUSH: 3.0000 mL | INTRAVENOUS | Status: AC | PRN

## 2023-04-16 MED ORDER — SODIUM CHLORIDE 0.9 % IV SOLN: 250.0000 mL | INTRAVENOUS | Status: AC | PRN

## 2023-04-25 ENCOUNTER — Other Ambulatory Visit: Payer: Self-pay

## 2023-04-25 ENCOUNTER — Encounter (HOSPITAL_COMMUNITY)
Admission: RE | Disposition: A | Discharge status: Home / Self Care | Payer: Self-pay | Source: Home / Self Care | Attending: Vascular Surgery

## 2023-04-25 ENCOUNTER — Encounter (HOSPITAL_COMMUNITY): Payer: Self-pay | Admitting: Vascular Surgery

## 2023-04-25 ENCOUNTER — Ambulatory Visit (HOSPITAL_COMMUNITY)
Admission: RE | Admit: 2023-04-25 | Discharge: 2023-04-25 | Disposition: A | Discharge status: Home / Self Care | Payer: BC Managed Care – PPO | Attending: Vascular Surgery | Admitting: Vascular Surgery

## 2023-04-25 DIAGNOSIS — N186 End stage renal disease: Secondary | ICD-10-CM | POA: Insufficient documentation

## 2023-04-25 DIAGNOSIS — Z87891 Personal history of nicotine dependence: Secondary | ICD-10-CM | POA: Insufficient documentation

## 2023-04-25 DIAGNOSIS — L97529 Non-pressure chronic ulcer of other part of left foot with unspecified severity: Secondary | ICD-10-CM | POA: Diagnosis not present

## 2023-04-25 DIAGNOSIS — Z8673 Personal history of transient ischemic attack (TIA), and cerebral infarction without residual deficits: Secondary | ICD-10-CM | POA: Diagnosis not present

## 2023-04-25 DIAGNOSIS — Z992 Dependence on renal dialysis: Secondary | ICD-10-CM | POA: Diagnosis not present

## 2023-04-25 DIAGNOSIS — I70222 Atherosclerosis of native arteries of extremities with rest pain, left leg: Secondary | ICD-10-CM

## 2023-04-25 DIAGNOSIS — I70245 Atherosclerosis of native arteries of left leg with ulceration of other part of foot: Secondary | ICD-10-CM

## 2023-04-25 DIAGNOSIS — Z940 Kidney transplant status: Secondary | ICD-10-CM | POA: Insufficient documentation

## 2023-04-25 DIAGNOSIS — I120 Hypertensive chronic kidney disease with stage 5 chronic kidney disease or end stage renal disease: Secondary | ICD-10-CM | POA: Insufficient documentation

## 2023-04-25 DIAGNOSIS — E785 Hyperlipidemia, unspecified: Secondary | ICD-10-CM | POA: Diagnosis not present

## 2023-04-25 HISTORY — PX: ABDOMINAL AORTOGRAM W/LOWER EXTREMITY: CATH118223

## 2023-04-25 HISTORY — PX: PERIPHERAL VASCULAR BALLOON ANGIOPLASTY: CATH118281

## 2023-04-25 LAB — POCT I-STAT, CHEM 8
BUN: 27 mg/dL — ABNORMAL HIGH (ref 8–23)
Calcium, Ion: 1.18 mmol/L (ref 1.15–1.40)
Chloride: 94 mmol/L — ABNORMAL LOW (ref 98–111)
Creatinine, Ser: 6.4 mg/dL — ABNORMAL HIGH (ref 0.44–1.00)
Glucose, Bld: 82 mg/dL (ref 70–99)
HCT: 49 % — ABNORMAL HIGH (ref 36.0–46.0)
Hemoglobin: 16.7 g/dL — ABNORMAL HIGH (ref 12.0–15.0)
Potassium: 3.6 mmol/L (ref 3.5–5.1)
Sodium: 139 mmol/L (ref 135–145)
TCO2: 33 mmol/L — ABNORMAL HIGH (ref 22–32)

## 2023-04-25 SURGERY — ABDOMINAL AORTOGRAM W/LOWER EXTREMITY
Anesthesia: LOCAL | Laterality: Left

## 2023-04-25 MED ORDER — ACETAMINOPHEN 325 MG PO TABS: 650.0000 mg | ORAL_TABLET | ORAL | Status: DC | PRN

## 2023-04-25 MED ORDER — SODIUM CHLORIDE 0.9% FLUSH: 3.0000 mL | Freq: Two times a day (BID) | INTRAVENOUS | Status: DC

## 2023-04-25 MED ORDER — LIDOCAINE HCL (PF) 1 % IJ SOLN
INTRAMUSCULAR | Status: AC
  Filled 2023-04-25: qty 30

## 2023-04-25 MED ORDER — HEPARIN (PORCINE) IN NACL 1000-0.9 UT/500ML-% IV SOLN
INTRAVENOUS | Status: DC | PRN
  Administered 2023-04-25 (×2): 500 mL

## 2023-04-25 MED ORDER — HEPARIN SODIUM (PORCINE) 1000 UNIT/ML IJ SOLN
INTRAMUSCULAR | Status: AC
Start: 2023-04-25 — End: ?
  Filled 2023-04-25: qty 10

## 2023-04-25 MED ORDER — LIDOCAINE HCL (PF) 1 % IJ SOLN
INTRAMUSCULAR | Status: DC | PRN
  Administered 2023-04-25: 15 mL

## 2023-04-25 MED ORDER — CLOPIDOGREL BISULFATE 75 MG PO TABS: 75.0000 mg | ORAL_TABLET | Freq: Every day | ORAL | 11 refills | Status: DC

## 2023-04-25 MED ORDER — HEPARIN SODIUM (PORCINE) 1000 UNIT/ML IJ SOLN
INTRAMUSCULAR | Status: DC | PRN
  Administered 2023-04-25: 6000 [IU] via INTRAVENOUS

## 2023-04-25 MED ORDER — ASPIRIN 81 MG PO CHEW
CHEWABLE_TABLET | ORAL | Status: DC | PRN
  Administered 2023-04-25: 81 mg via ORAL

## 2023-04-25 MED ORDER — HYDRALAZINE HCL 20 MG/ML IJ SOLN: 5.0000 mg | INTRAMUSCULAR | Status: DC | PRN

## 2023-04-25 MED ORDER — NITROGLYCERIN 1 MG/10 ML FOR IR/CATH LAB
INTRA_ARTERIAL | Status: DC | PRN
  Administered 2023-04-25: 200 ug via INTRA_ARTERIAL

## 2023-04-25 MED ORDER — MIDAZOLAM HCL 2 MG/2ML IJ SOLN
INTRAMUSCULAR | Status: DC | PRN
  Administered 2023-04-25: 1 mg via INTRAVENOUS

## 2023-04-25 MED ORDER — SODIUM CHLORIDE 0.9 % IV SOLN
250.0000 mL | INTRAVENOUS | Status: DC | PRN
Start: 2023-04-25 — End: 2023-04-25

## 2023-04-25 MED ORDER — FENTANYL CITRATE (PF) 100 MCG/2ML IJ SOLN
INTRAMUSCULAR | Status: DC | PRN
  Administered 2023-04-25: 25 ug via INTRAVENOUS

## 2023-04-25 MED ORDER — SODIUM CHLORIDE 0.9% FLUSH: 3.0000 mL | INTRAVENOUS | Status: DC | PRN

## 2023-04-25 MED ORDER — CLOPIDOGREL BISULFATE 75 MG PO TABS: 300.0000 mg | ORAL_TABLET | Freq: Once | ORAL | Status: DC

## 2023-04-25 MED ORDER — FENTANYL CITRATE (PF) 100 MCG/2ML IJ SOLN
INTRAMUSCULAR | Status: AC
  Filled 2023-04-25: qty 2

## 2023-04-25 MED ORDER — LABETALOL HCL 5 MG/ML IV SOLN: 10.0000 mg | INTRAVENOUS | Status: DC | PRN

## 2023-04-25 MED ORDER — NITROGLYCERIN 1 MG/10 ML FOR IR/CATH LAB
INTRA_ARTERIAL | Status: AC
  Filled 2023-04-25: qty 10

## 2023-04-25 MED ORDER — OXYCODONE HCL 5 MG PO TABS
5.0000 mg | ORAL_TABLET | ORAL | Status: DC | PRN
Start: 2023-04-25 — End: 2023-04-25

## 2023-04-25 MED ORDER — CLOPIDOGREL BISULFATE 300 MG PO TABS
ORAL_TABLET | ORAL | Status: AC
  Filled 2023-04-25: qty 1

## 2023-04-25 MED ORDER — MIDAZOLAM HCL 2 MG/2ML IJ SOLN
INTRAMUSCULAR | Status: AC
Start: 2023-04-25 — End: ?
  Filled 2023-04-25: qty 2

## 2023-04-25 MED ORDER — CLOPIDOGREL BISULFATE 300 MG PO TABS
ORAL_TABLET | ORAL | Status: DC | PRN
  Administered 2023-04-25: 300 mg via ORAL

## 2023-04-25 MED ORDER — ASPIRIN 81 MG PO CHEW
CHEWABLE_TABLET | ORAL | Status: AC
  Filled 2023-04-25: qty 1

## 2023-04-25 MED ORDER — ONDANSETRON HCL 4 MG/2ML IJ SOLN: 4.0000 mg | Freq: Four times a day (QID) | INTRAMUSCULAR | Status: DC | PRN

## 2023-04-25 MED ORDER — IODIXANOL 320 MG/ML IV SOLN
INTRAVENOUS | Status: DC | PRN
  Administered 2023-04-25: 45 mL

## 2023-04-25 MED ORDER — CLOPIDOGREL BISULFATE 75 MG PO TABS: 75.0000 mg | ORAL_TABLET | Freq: Every day | ORAL | Status: DC

## 2023-04-25 SURGICAL SUPPLY — 17 items
BALLN STERLING OTW 2X100X150 (BALLOONS) ×1 IMPLANT
BALLOON STERLING OTW 2X100X150 (BALLOONS) IMPLANT
CATH OMNI FLUSH 5F 65CM (CATHETERS) IMPLANT
CATH QUICKCROSS SUPP .035X90CM (MICROCATHETER) IMPLANT
COVER DOME SNAP 22 D (MISCELLANEOUS) IMPLANT
DEVICE CLOSURE MYNXGRIP 5F (Vascular Products) IMPLANT
GLIDEWIRE ADV .035X260CM (WIRE) IMPLANT
KIT ENCORE 26 ADVANTAGE (KITS) IMPLANT
KIT MICROPUNCTURE NIT STIFF (SHEATH) IMPLANT
KIT SYRINGE INJ CVI SPIKEX1 (MISCELLANEOUS) IMPLANT
SET ATX-X65L (MISCELLANEOUS) IMPLANT
SHEATH CATAPULT 5FR 60 (SHEATH) IMPLANT
SHEATH PINNACLE 5F 10CM (SHEATH) IMPLANT
SHEATH PROBE COVER 6X72 (BAG) IMPLANT
TRAY PV CATH (CUSTOM PROCEDURE TRAY) ×2 IMPLANT
WIRE BENTSON .035X145CM (WIRE) IMPLANT
WIRE G V18X300CM (WIRE) IMPLANT

## 2023-04-25 NOTE — Progress Notes (Signed)
Pt ambulated in hall ~100 ft with no signs of oozing from right groin site  

## 2023-04-25 NOTE — Op Note (Signed)
Patient name: Blayze Kober MRN: [id number] DOB: November 28, 1955 Sex: female  04/25/2023 Pre-operative Diagnosis: Critical limb ischemia of the left lower extremity with tissue loss Post-operative diagnosis:  Same Surgeon:  Cephus Shelling, MD Procedure Performed: 1.  Ultrasound-guided access right common femoral artery 2.  Aortogram with catheter selection of aorta 3.  Left lower extremity arteriogram with catheter selection of the left popliteal artery 4.  Left posterior tibial artery angioplasty (2 mm x 100 mm Sterling) 5.  Mynx closure of the right common femoral artery 6.  37 minutes of monitored moderate conscious sedation time  Indications: Patient is a 68 year old female with multiple risk factors including ESRD that is being followed by podiatry for an ingrown toenail on the left.  She presents for lower extremity arteriogram with possible invention after risks benefits discussed.  Findings: Ultrasound-guided access right common femoral artery.  Aortogram showed patent infrarenal aorta with patent bilateral iliacs.  Both hypogastrics were patent.  The renals were not visualized.  SMA is patent.  On the left, she has a patent common femoral, profunda, SFA, above and below-knee popliteal artery without flow-limiting stenosis.  She has tibial disease.  The AT occludes shortly after takeoff.  She has two-vessel runoff.  Dominant runoff is in the peroneal that is small without any flow-limiting stenosis and fills the DP in the foot.  The posterior tibial has a long segment high-grade stenosis over 90% throughout the proximal to mid segment.  Distally it fills into the foot but the plantar arch is occluded in the midportion of the foot.  Ultimately the proximal to mid PT was treated with a 2 mm Sterling.  No significant flow-limiting stenosis after intervention.  Preserved runoff.  Now has 2 vessels to the ankle.   Procedure:  The patient was identified in the holding area and taken to  room 8.  The patient was then placed supine on the table and prepped and draped in the usual sterile fashion.  A time out was called.  The patient received Versed and fentanyl for conscious moderate sedation.  Vital signs were monitored including heart rate, respiratory rate, oxygenation and blood pressure.  I was present for all moderate sedation.  Ultrasound was used to evaluate the right common femoral artery.  It was patent .  A digital ultrasound image was acquired.  A micropuncture needle was used to access the right common femoral artery under ultrasound guidance.  An 018 wire was advanced without resistance and a micropuncture sheath was placed.  The 018 wire was removed and a benson wire was placed.  The micropuncture sheath was exchanged for a 5 french sheath.  An omniflush catheter was advanced over the wire to the level of L-1.  An abdominal angiogram was obtained.  Next, using the omniflush catheter and a benson wire, the aortic bifurcation was crossed and the catheter was placed into theleft external iliac artery and left runoff was obtained.  We elected for left tibial intervention.  I used a Glidewire advantage down the left SFA and upsized to a 5 [place] catapult sheath in the right groin into the left SFA.  Patient was given 100 units/kg IV heparin.  I advanced my quick cross catheter into the popliteal artery on the left and we got left lower extremity arteriogram with hand-injection.  I then selected the posterior tibial and got my wire down to the ankle.  We gave nitroglycerin.  The proximal and mid PT was treated with a 2 mm x  100 mm sterling to nominal pressure for 2 minutes in each segment.  Final imaging showed no evidence of residual stenosis.  Now has two-vessel runoff.  Both vessels are small and calcified.  Wires and catheters were removed.  A mnx closure was placed in the right groin we will put a short 5 French sheath.  Plan: Optimized from vascular surgery standpoint.  Aspirin Plavix  statin.  Will arrange follow-up in 1 month with duplex.  Follow-up with podiatry.  Cephus Shelling, MD [facility] [place] Office: [phone]

## 2023-04-25 NOTE — H&P (Signed)
History and Physical Interval Note:  04/25/2023 7:23 AM  Karen [name]  has presented today for surgery, with the diagnosis of left great toe wound.  The various methods of treatment have been discussed with the patient and family. After consideration of risks, benefits and other options for treatment, the patient has consented to  Procedure(s): ABDOMINAL AORTOGRAM W/LOWER EXTREMITY (N/A) as a surgical intervention.  The patient's history has been reviewed, patient examined, no change in status, stable for surgery.  I have reviewed the patient's chart and labs.  Questions were answered to the patient's satisfaction.     Cephus Shelling     Patient name: Karen [name]           MRN: [id number]        DOB: [birth year]        Sex: female   REASON FOR CONSULT: Scab getting longer and thinner over AV access/PAD with abnormal ABI   HPI: Karen [name] is a 68 y.o. female, with history of hypertension, hyperlipidemia, CVA, ESRD  that presents for evaluation of scab getting longer and thinner over AV access also referred for PAD by podiatry.   As it relates to her AV fistula she has a right brachiocephalic.  This last underwent revision on 08/11/2021 with excision of the aneurysm and primary repair.  She did have a fistulogram on 03/15/2023 with angioplasty of cephalic vein outflow stenosis.   The referral for PAD is from Dr. Lilian Kapur with podiatry.  He states left ingrown toenail.  She did have ABIs on 03/06/2023 that were noncompressible monophasic at the ankle with toe pressure of 0.  States the wound has been present for several months.  States the left great toenail has been draining some pus.       Past Medical History:  Diagnosis Date   Anemia associated with chronic renal failure     Arthritis     Convulsions/seizures (HCC) 01/28/2013   Dyslipidemia     GERD (gastroesophageal reflux disease)     Hyperlipidemia     Hypertension     Kidney transplanted     Seizure disorder [facility]      Stroke [facility])      "possible mini stroke" years ago per patient- patient no longer sees neurologist               Past Surgical History:  Procedure Laterality Date   A/V FISTULAGRAM N/A 03/15/2023    Procedure: A/V Fistulagram;  Surgeon: Ethelene Hal, MD;  Location: [facility] INVASIVE CV LAB;  Service: Cardiovascular;  Laterality: N/A;   CATHETER REMOVAL       COLONOSCOPY       GALLBLADDER SURGERY       INSERTION OF DIALYSIS CATHETER Left 08/11/2021    Procedure: INSERTION LEFT INTERNAL JUGULAR TUNNELED DIALYSIS CATHETER;  Surgeon: Cephus Shelling, MD;  Location: [facility] OR;  Service: Vascular;  Laterality: Left;   KIDNEY TRANSPLANT        04/10/2009   REVISON OF ARTERIOVENOUS FISTULA Right 08/11/2021    Procedure: RIGHT ARM ARTERIOVENOUS FISTULA REVISION WITH ANEURYSM REMOVAL;  Surgeon: Cephus Shelling, MD;  Location: [facility] OR;  Service: Vascular;  Laterality: Right;               Family History  Problem Relation Age of Onset   Stroke Mother     Seizures Mother     Migraines Mother     Diabetes Brother     Colon cancer Neg Hx  Esophageal cancer Neg Hx     Stomach cancer Neg Hx     Rectal cancer Neg Hx            SOCIAL HISTORY: Social History         Socioeconomic History   Marital status: Single      Spouse name: Not on file   Number of children: 1   Years of education: 12   Highest education level: Not on file  Occupational History   Occupation: CNA  Tobacco Use   Smoking status: Former      Types: Cigarettes      Passive exposure: Never   Smokeless tobacco: Never   Tobacco comments:      quit smoking 25 yrs ago  Substance and Sexual Activity   Alcohol use: No      Comment: quit 25 years ago   Drug use: No   Sexual activity: Not on file  Other Topics Concern   Not on file  Social History Narrative    Right handed    No caffeine    One story home    Social Drivers of Health        Financial Resource Strain: Not on file  Food Insecurity: No  Food Insecurity (02/28/2022)    Hunger Vital Sign     Worried About Running Out of Food in the Last Year: Never true     Ran Out of Food in the Last Year: Never true  Transportation Needs: No Transportation Needs (02/28/2022)    PRAPARE - Therapist, art (Medical): No     Lack of Transportation (Non-Medical): No  Physical Activity: Not on file  Stress: Not on file  Social Connections: Not on file  Intimate Partner Violence: Not At Risk (02/28/2022)    Humiliation, Afraid, Rape, and Kick questionnaire     Fear of Current or Ex-Partner: No     Emotionally Abused: No     Physically Abused: No     Sexually Abused: No      Allergies  No Known Allergies           Current Outpatient Medications  Medication Sig Dispense Refill   aspirin EC 81 MG tablet Take 81 mg by mouth daily.       calcitRIOL (ROCALTROL) 0.25 MCG capsule Take 0.25 mcg by mouth every Monday, Wednesday, and Friday.       cephALEXin (KEFLEX) 500 MG capsule Take 1 capsule (500 mg total) by mouth every dialysis (following dialysis session). 6 capsule 0   Doxercalciferol (HECTOROL IV) Doxercalciferol (Hectorol)       hydrALAZINE (APRESOLINE) 100 MG tablet Take 100 mg by mouth daily as needed (for systolic blood pressure of 157 and above). (Patient not taking: Reported on 02/18/2023)       magnesium oxide (MAG-OX) 400 MG tablet Take 400 mg by mouth 2 (two) times daily.        omeprazole (PRILOSEC) 20 MG capsule Take 20 mg by mouth daily.       predniSONE (DELTASONE) 5 MG tablet Take 5 mg by mouth daily.       sevelamer carbonate (RENVELA) 800 MG tablet Take 800 mg by mouth 3 (three) times daily with meals. Take 1 tablet with snacks       simvastatin (ZOCOR) 20 MG tablet Take 20 mg by mouth every evening.       VENTOLIN HFA 108 (90 Base) MCG/ACT inhaler Inhale 1 puff into the lungs  3 times/day as needed-between meals & bedtime.          No current facility-administered medications for this visit.         REVIEW OF SYSTEMS:  [X]  denotes positive finding, [ ]  denotes negative finding Cardiac   Comments:  Chest pain or chest pressure:      Shortness of breath upon exertion:      Short of breath when lying flat:      Irregular heart rhythm:             Vascular      Pain in calf, thigh, or hip brought on by ambulation:      Pain in feet at night that wakes you up from your sleep:       Blood clot in your veins:      Leg swelling:              Pulmonary      Oxygen at home:      Productive cough:       Wheezing:              Neurologic      Sudden weakness in arms or legs:       Sudden numbness in arms or legs:       Sudden onset of difficulty speaking or slurred speech:      Temporary loss of vision in one eye:       Problems with dizziness:              Gastrointestinal      Blood in stool:       Vomited blood:              Genitourinary      Burning when urinating:       Blood in urine:             Psychiatric      Major depression:              Hematologic      Bleeding problems:      Problems with blood clotting too easily:             Skin      Rashes or ulcers:             Constitutional      Fever or chills:          PHYSICAL EXAM: There were no vitals filed for this visit.   GENERAL: The patient is a well-nourished female, in no acute distress. The vital signs are documented above. CARDIAC: There is a regular rate and rhythm.  VASCULAR:  Bilateral common femoral pulses palpable No palpable pedal pulses Left great toe as pictured PULMONARY: No respiratory distress. ABDOMEN: Soft and non-tender. MUSCULOSKELETAL: There are no major deformities or cyanosis. NEUROLOGIC: No focal weakness or paresthesias are detected. PSYCHIATRIC: The patient has a normal affect.      DATA:    ABIs 03/06/2023 noncompressible and monophasic with toe pressures of 0   Assessment/Plan:   68 y.o. female, with history of hypertension, hyperlipidemia, CVA, ESRD  that presents for evaluation of scab getting longer and thinner over AV access and also referred for PAD by podiatry.   As it relates to her AV fistula she has a right brachiocephalic.  This last underwent revision on 08/11/2021 with excision of the aneurysm and primary repair.  She did have a fistulogram on 03/15/2023 with angioplasty of cephalic vein  outflow stenosis.  I do not think the fistula requires any revision at this time.  She has some hypopigmented skin but it looks otherwise intact without frank ulceration.  She states it is working well at this time.   The referral for PAD is from Dr. Lilian Kapur with podiatry.  She has a left ingrown toenail with some drainage as pictured above.  She did have ABIs on 03/06/2023 that were noncompressible monophasic at the ankle with toe pressure of 0.  States the wound has been present for several months.  I do not palpate any pedal pulses and she has a toe pressure of 0.  I have recommended aortogram, lower extremity arteriogram with possible intervention with a focus on the left leg.  Risk benefits discussed including risk of transfemoral access.  Will get scheduled at [facility].     Cephus Shelling, MD [facility] [place] Office: [phone]

## 2023-04-29 ENCOUNTER — Ambulatory Visit (INDEPENDENT_AMBULATORY_CARE_PROVIDER_SITE_OTHER): Payer: BC Managed Care – PPO | Admitting: Podiatry

## 2023-04-29 ENCOUNTER — Encounter: Payer: Self-pay | Admitting: Podiatry

## 2023-04-29 DIAGNOSIS — L600 Ingrowing nail: Secondary | ICD-10-CM | POA: Diagnosis not present

## 2023-04-29 DIAGNOSIS — I739 Peripheral vascular disease, unspecified: Secondary | ICD-10-CM | POA: Diagnosis not present

## 2023-04-29 NOTE — Patient Instructions (Signed)

## 2023-04-29 NOTE — Progress Notes (Signed)
  Subjective:  Patient ID: Karen [name], female    DOB: 1955-11-09,  MRN: [id number]  Chief Complaint  Patient presents with   Nail Problem    "I'm back I had the vein and vascular procedure on Thursday of last week.  I hope Dr Lilian Kapur will do something about this toe now.  I wonder if he will prescribe me something for cracked heels."      She states that is starting to feel better after having the angiography   Objective:    Physical Exam   EXTREMITIES: No signs of infection of the medial border of left great toenail there are no signs of active infection, some onycholysis, her pulses are nonpalpable.  Dry cracked skin on both heels without infection or ulceration      No images are attached to the encounter.    Results   Angiography 04/25/2023 with PTA of posterior tibial artery with improved flow, images reviewed and has good flow to the midfoot but significant microvascular disease     Assessment:   Encounter Diagnoses  Name Primary?   Ingrowing left great toenail Yes   PAD (peripheral artery disease) (HCC)        Plan:  Patient was evaluated and treated and all questions answered.  Assessment and Plan    Infected Ingrown Toenail   Has improved there is some slight onycholysis I debrided the loose portions of the nail to a comfortable level, discussed with her that the significant microvascular disease even total temporary avulsion may have difficulty healing and would like to treat conservatively for now, I advised her if any signs of infection develop such as pain swelling redness or further loosening of the nail to return to see me for total avulsion.  I will see her back in 1 month to reevaluate.  She has dry cracked heels and I recommended urea cream and she will get this today.

## 2023-05-12 IMAGING — DX DG CHEST 1V PORT
1 series · 1 of 1 positions shown · non-contrast
Comparison: 06/15/2021

CLINICAL DATA: Dialysis catheter placement.

EXAM:
PORTABLE CHEST 1 VIEW

[chest ap]
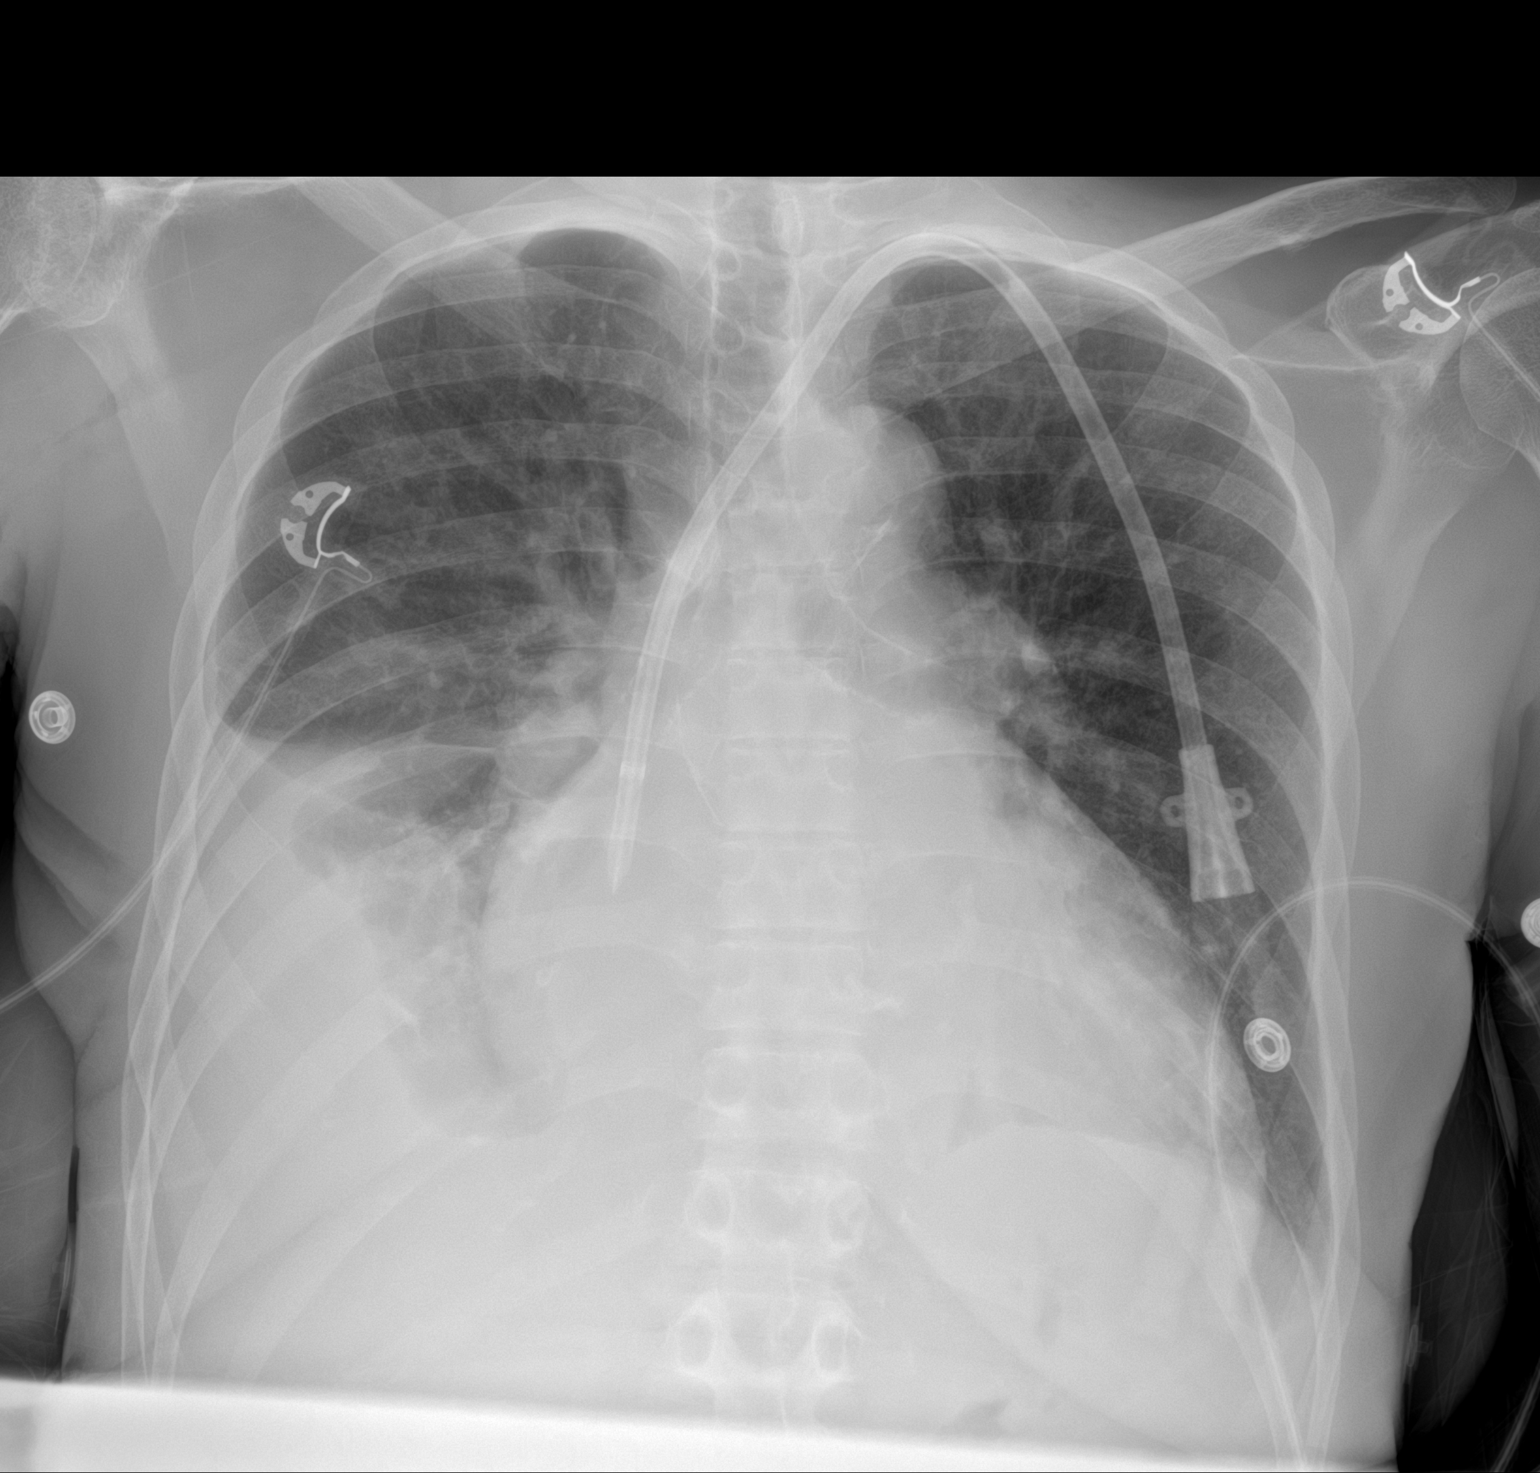

[1 of 1 positions shown; findings below may reference images not displayed]

FINDINGS: The left IJ dialysis catheter is in good position. The distal tip is
near the cavoatrial junction. No complicating features are
identified.

Stable cardiac enlargement and central vascular congestion. Slightly
larger right pleural effusion and overlying atelectasis.
IMPRESSION: 1. Left IJ dialysis catheter in good position without complicating
features.
2. Slightly larger right pleural effusion and overlying atelectasis.

## 2023-05-30 ENCOUNTER — Ambulatory Visit (INDEPENDENT_AMBULATORY_CARE_PROVIDER_SITE_OTHER): Payer: Medicare Other | Admitting: Podiatry

## 2023-05-30 DIAGNOSIS — L600 Ingrowing nail: Secondary | ICD-10-CM

## 2023-05-30 DIAGNOSIS — I739 Peripheral vascular disease, unspecified: Secondary | ICD-10-CM

## 2023-05-30 NOTE — Progress Notes (Signed)
  Subjective:  Patient ID: Karen [name], female    DOB: [birth year],  MRN: [id number]  Chief Complaint  Patient presents with   Ingrowing left great toenail    Left great toenail, pt stated that her nail is feeling much better no pain or discomfort      Doing much better after the last visit   Objective:    Physical Exam   EXTREMITIES: No ingrown or paronychia today there is still some residual dystrophy of the left great toenail hallux, it overall appearance improved in temperature and warmth      No images are attached to the encounter.    Results   Angiography 04/25/2023 with PTA of posterior tibial artery with improved flow, images reviewed and has good flow to the midfoot but significant microvascular disease     Assessment:   Encounter Diagnoses  Name Primary?   Ingrowing left great toenail Yes   PAD (peripheral artery disease) (HCC)        Plan:  Patient was evaluated and treated and all questions answered.  Assessment and Plan    Infected Ingrown Toenail   Improved so far I debrided the left hallux medial border further today.  She will return in 2 months and continue with at risk regular footcare advised her that if this worsens or there is new signs of infection or pain in the toes or return to see me sooner.

## 2023-06-03 ENCOUNTER — Other Ambulatory Visit: Payer: Self-pay | Admitting: Internal Medicine

## 2023-06-03 DIAGNOSIS — Z1231 Encounter for screening mammogram for malignant neoplasm of breast: Secondary | ICD-10-CM

## 2023-06-06 ENCOUNTER — Other Ambulatory Visit: Payer: Self-pay | Admitting: *Deleted

## 2023-06-06 DIAGNOSIS — I70222 Atherosclerosis of native arteries of extremities with rest pain, left leg: Secondary | ICD-10-CM

## 2023-06-11 ENCOUNTER — Ambulatory Visit (INDEPENDENT_AMBULATORY_CARE_PROVIDER_SITE_OTHER)
Admit: 2023-06-11 | Discharge: 2023-06-11 | Disposition: A | Discharge status: Home / Self Care | Payer: BC Managed Care – PPO | Attending: Vascular Surgery | Admitting: Vascular Surgery

## 2023-06-11 ENCOUNTER — Ambulatory Visit (INDEPENDENT_AMBULATORY_CARE_PROVIDER_SITE_OTHER): Payer: BC Managed Care – PPO | Admitting: Physician Assistant

## 2023-06-11 ENCOUNTER — Ambulatory Visit (HOSPITAL_COMMUNITY)
Admission: RE | Admit: 2023-06-11 | Discharge: 2023-06-11 | Disposition: A | Discharge status: Home / Self Care | Payer: BC Managed Care – PPO | Source: Ambulatory Visit | Attending: Vascular Surgery | Admitting: Vascular Surgery

## 2023-06-11 VITALS — BP 175/76 | HR 86 | Temp 98.6°F | Ht 60.0 in | Wt 139.7 lb

## 2023-06-11 DIAGNOSIS — I70222 Atherosclerosis of native arteries of extremities with rest pain, left leg: Secondary | ICD-10-CM

## 2023-06-11 LAB — VAS US ABI WITH/WO TBI

## 2023-06-11 NOTE — Progress Notes (Signed)
 Office Note     CC:  follow up Requesting Provider:  Bufford Buttner, MD  HPI: Karen [name] is a 69 y.o. ([birth year]) female who presents as post aortogram with left lower extremity runoff with posterior tibial artery angioplasty by Dr. Chestine Spore on 04/25/2023 due to critical limb ischemia with left great toe tissue loss.  Since her procedure she states she has healed her left great toe wound.  She denies any pain in the right groin cath site.  She is on daily aspirin, Plavix, statin.   Past Medical History:  Diagnosis Date   Anemia associated with chronic renal failure    Arthritis    Convulsions/seizures (HCC) 01/28/2013   Dyslipidemia    GERD (gastroesophageal reflux disease)    Hyperlipidemia    Hypertension    Kidney transplanted    Peripheral vascular disease (HCC)    Seizure disorder (HCC)    Stroke (HCC)    "possible mini stroke" years ago per patient- patient no longer sees neurologist    Past Surgical History:  Procedure Laterality Date   A/V FISTULAGRAM N/A 03/15/2023   Procedure: A/V Fistulagram;  Surgeon: Ethelene Hal, MD;  Location: [facility] INVASIVE CV LAB;  Service: Cardiovascular;  Laterality: N/A;   ABDOMINAL AORTOGRAM W/LOWER EXTREMITY Left 04/25/2023   Procedure: ABDOMINAL AORTOGRAM W/LOWER EXTREMITY;  Surgeon: Cephus Shelling, MD;  Location: [facility] INVASIVE CV LAB;  Service: Cardiovascular;  Laterality: Left;   CATHETER REMOVAL     COLONOSCOPY     GALLBLADDER SURGERY     INSERTION OF DIALYSIS CATHETER Left 08/11/2021   Procedure: INSERTION LEFT INTERNAL JUGULAR TUNNELED DIALYSIS CATHETER;  Surgeon: Cephus Shelling, MD;  Location: [facility] OR;  Service: Vascular;  Laterality: Left;   KIDNEY TRANSPLANT     04/10/2009   PERIPHERAL VASCULAR BALLOON ANGIOPLASTY Left 04/25/2023   Procedure: PERIPHERAL VASCULAR BALLOON ANGIOPLASTY;  Surgeon: Cephus Shelling, MD;  Location: [facility] INVASIVE CV LAB;  Service: Cardiovascular;  Laterality: Left;   REVISON OF ARTERIOVENOUS  FISTULA Right 08/11/2021   Procedure: RIGHT ARM ARTERIOVENOUS FISTULA REVISION WITH ANEURYSM REMOVAL;  Surgeon: Cephus Shelling, MD;  Location: [facility] OR;  Service: Vascular;  Laterality: Right;    Social History   Socioeconomic History   Marital status: Single    Spouse name: Not on file   Number of children: 1   Years of education: 12   Highest education level: Not on file  Occupational History   Occupation: CNA  Tobacco Use   Smoking status: Former    Types: Cigarettes    Passive exposure: Never   Smokeless tobacco: Never   Tobacco comments:    quit smoking 25 yrs ago  Vaping Use   Vaping status: Never Used  Substance and Sexual Activity   Alcohol use: No    Comment: quit 25 years ago   Drug use: No   Sexual activity: Not on file  Other Topics Concern   Not on file  Social History Narrative   Right handed   No caffeine   One story home   Social Drivers of Health   Financial Resource Strain: Not on file  Food Insecurity: No Food Insecurity (02/28/2022)   Hunger Vital Sign    Worried About Running Out of Food in the Last Year: Never true    Ran Out of Food in the Last Year: Never true  Transportation Needs: No Transportation Needs (02/28/2022)   PRAPARE - Administrator, Civil Service (Medical): No  Lack of Transportation (Non-Medical): No  Physical Activity: Not on file  Stress: Not on file  Social Connections: Not on file  Intimate Partner Violence: Not At Risk (02/28/2022)   Humiliation, Afraid, Rape, and Kick questionnaire    Fear of Current or Ex-Partner: No    Emotionally Abused: No    Physically Abused: No    Sexually Abused: No    Family History  Problem Relation Age of Onset   Stroke Mother    Seizures Mother    Migraines Mother    Diabetes Brother    Colon cancer Neg Hx    Esophageal cancer Neg Hx    Stomach cancer Neg Hx    Rectal cancer Neg Hx     Current Outpatient Medications  Medication Sig Dispense Refill   aspirin  EC 81 MG tablet Take 81 mg by mouth daily.     calcitRIOL (ROCALTROL) 0.25 MCG capsule Take 0.25 mcg by mouth every Monday, Wednesday, and Friday.     clopidogrel (PLAVIX) 75 MG tablet Take 1 tablet (75 mg total) by mouth daily. 30 tablet 11   Doxercalciferol (HECTOROL IV) Doxercalciferol (Hectorol)     hydrALAZINE (APRESOLINE) 100 MG tablet Take 100 mg by mouth daily as needed (for systolic blood pressure of 157 and above).     magnesium oxide (MAG-OX) 400 MG tablet Take 400 mg by mouth 2 (two) times daily.      omeprazole (PRILOSEC) 20 MG capsule Take 20 mg by mouth daily.     predniSONE (DELTASONE) 2.5 MG tablet Take 2.5 mg by mouth daily.     sevelamer carbonate (RENVELA) 800 MG tablet Take 1,600 mg by mouth 3 (three) times daily with meals. Take 800 mg tablet with snacks     simvastatin (ZOCOR) 20 MG tablet Take 20 mg by mouth every evening.     VENTOLIN HFA 108 (90 Base) MCG/ACT inhaler Inhale 1 puff into the lungs 3 times/day as needed-between meals & bedtime.     Current Facility-Administered Medications  Medication Dose Route Frequency Provider Last Rate Last Admin   sodium chloride flush (NS) 0.9 % injection 3 mL  3 mL Intravenous Q12H Cephus Shelling, MD       sodium chloride flush (NS) 0.9 % injection 3 mL  3 mL Intravenous PRN Cephus Shelling, MD        No Known Allergies   REVIEW OF SYSTEMS:   [X]  denotes positive finding, [ ]  denotes negative finding Cardiac  Comments:  Chest pain or chest pressure:    Shortness of breath upon exertion:    Short of breath when lying flat:    Irregular heart rhythm:        Vascular    Pain in calf, thigh, or hip brought on by ambulation:    Pain in feet at night that wakes you up from your sleep:     Blood clot in your veins:    Leg swelling:         Pulmonary    Oxygen at home:    Productive cough:     Wheezing:         Neurologic    Sudden weakness in arms or legs:     Sudden numbness in arms or legs:     Sudden  onset of difficulty speaking or slurred speech:    Temporary loss of vision in one eye:     Problems with dizziness:         Gastrointestinal  Blood in stool:     Vomited blood:         Genitourinary    Burning when urinating:     Blood in urine:        Psychiatric    Major depression:         Hematologic    Bleeding problems:    Problems with blood clotting too easily:        Skin    Rashes or ulcers:        Constitutional    Fever or chills:      PHYSICAL EXAMINATION:  Vitals:   06/11/23 1408  BP: (!) 175/76  Pulse: 86  Temp: 98.6 F (37 C)  TempSrc: Temporal  SpO2: 91%  Weight: 139 lb 11.2 oz (63.4 kg)  Height: 5' (1.524 m)    General:  WDWN in NAD; vital signs documented above Gait: Not observed HENT: WNL, normocephalic Pulmonary: normal non-labored breathing , without Rales, rhonchi,  wheezing Cardiac: regular HR Abdomen: soft, NT, no masses Skin: without rashes Vascular Exam/Pulses: Palpable left PT pulse Extremities: without ischemic changes, without Gangrene , without cellulitis; without open wounds;  Musculoskeletal: no muscle wasting or atrophy  Neurologic: A&O X 3 Psychiatric:  The pt has Normal affect.   Non-Invasive Vascular Imaging:   PT widely patent on left lower extremity arterial duplex ABI/TBIToday's ABIToday's TBIPrevious ABIPrevious TBI  +-------+-----------+-----------+------------+------------+  Right [place]         0.68       [place]          0             +-------+-----------+-----------+------------+------------+  Left  [place]         0.69       [place]          0             +-------+-----------+-----------+------------+-----------    ASSESSMENT/PLAN:: 68 y.o. female status post aortogram with left leg runoff involving balloon angioplasty of the posterior tibial artery  Left great toe ingrown toenail has completely healed since her procedure.  On exam she has a palpable PT pulse.  Duplex confirms patency of endovascular  intervention.  She will continue her aspirin, Plavix, statin daily.  We will repeat left lower extremity arterial duplex and ABI in 6 months.  She will continue follow-up with her podiatrist regularly.  She knows to call/turn office sooner with any questions or concerns.   Emilie Rutter, PA-C [facility] [phone]  Clinic MD:   Chestine Spore

## 2023-06-12 ENCOUNTER — Ambulatory Visit
Admission: RE | Admit: 2023-06-12 | Discharge: 2023-06-12 | Disposition: A | Discharge status: Home / Self Care | Source: Ambulatory Visit | Attending: Internal Medicine | Admitting: Internal Medicine

## 2023-06-12 DIAGNOSIS — Z1231 Encounter for screening mammogram for malignant neoplasm of breast: Secondary | ICD-10-CM

## 2023-06-14 ENCOUNTER — Other Ambulatory Visit: Payer: Self-pay | Admitting: *Deleted

## 2023-06-14 DIAGNOSIS — I70222 Atherosclerosis of native arteries of extremities with rest pain, left leg: Secondary | ICD-10-CM

## 2023-07-15 ENCOUNTER — Other Ambulatory Visit: Payer: Self-pay

## 2023-07-15 ENCOUNTER — Emergency Department (HOSPITAL_COMMUNITY)

## 2023-07-15 ENCOUNTER — Encounter (HOSPITAL_COMMUNITY): Payer: Self-pay | Admitting: Emergency Medicine

## 2023-07-15 ENCOUNTER — Inpatient Hospital Stay (HOSPITAL_COMMUNITY)
Admission: EM | Admit: 2023-07-15 | Discharge: 2023-07-18 | DRG: 640 | Disposition: A | Discharge status: Home / Self Care | Attending: Internal Medicine | Admitting: Internal Medicine

## 2023-07-15 DIAGNOSIS — Z87891 Personal history of nicotine dependence: Secondary | ICD-10-CM | POA: Diagnosis not present

## 2023-07-15 DIAGNOSIS — Z7902 Long term (current) use of antithrombotics/antiplatelets: Secondary | ICD-10-CM | POA: Diagnosis not present

## 2023-07-15 DIAGNOSIS — K219 Gastro-esophageal reflux disease without esophagitis: Secondary | ICD-10-CM | POA: Diagnosis present

## 2023-07-15 DIAGNOSIS — Z7952 Long term (current) use of systemic steroids: Secondary | ICD-10-CM

## 2023-07-15 DIAGNOSIS — Z823 Family history of stroke: Secondary | ICD-10-CM | POA: Diagnosis not present

## 2023-07-15 DIAGNOSIS — N186 End stage renal disease: Secondary | ICD-10-CM | POA: Diagnosis present

## 2023-07-15 DIAGNOSIS — Z992 Dependence on renal dialysis: Secondary | ICD-10-CM

## 2023-07-15 DIAGNOSIS — J9811 Atelectasis: Secondary | ICD-10-CM | POA: Diagnosis present

## 2023-07-15 DIAGNOSIS — N2581 Secondary hyperparathyroidism of renal origin: Secondary | ICD-10-CM | POA: Diagnosis present

## 2023-07-15 DIAGNOSIS — Z7982 Long term (current) use of aspirin: Secondary | ICD-10-CM

## 2023-07-15 DIAGNOSIS — G40909 Epilepsy, unspecified, not intractable, without status epilepticus: Secondary | ICD-10-CM | POA: Diagnosis present

## 2023-07-15 DIAGNOSIS — Y830 Surgical operation with transplant of whole organ as the cause of abnormal reaction of the patient, or of later complication, without mention of misadventure at the time of the procedure: Secondary | ICD-10-CM | POA: Diagnosis present

## 2023-07-15 DIAGNOSIS — Z8673 Personal history of transient ischemic attack (TIA), and cerebral infarction without residual deficits: Secondary | ICD-10-CM | POA: Diagnosis not present

## 2023-07-15 DIAGNOSIS — Z79899 Other long term (current) drug therapy: Secondary | ICD-10-CM

## 2023-07-15 DIAGNOSIS — D631 Anemia in chronic kidney disease: Secondary | ICD-10-CM | POA: Diagnosis present

## 2023-07-15 DIAGNOSIS — T8612 Kidney transplant failure: Secondary | ICD-10-CM | POA: Diagnosis present

## 2023-07-15 DIAGNOSIS — Z833 Family history of diabetes mellitus: Secondary | ICD-10-CM

## 2023-07-15 DIAGNOSIS — N289 Disorder of kidney and ureter, unspecified: Secondary | ICD-10-CM | POA: Diagnosis not present

## 2023-07-15 DIAGNOSIS — J90 Pleural effusion, not elsewhere classified: Secondary | ICD-10-CM | POA: Diagnosis present

## 2023-07-15 DIAGNOSIS — I739 Peripheral vascular disease, unspecified: Secondary | ICD-10-CM | POA: Diagnosis present

## 2023-07-15 DIAGNOSIS — E8770 Fluid overload, unspecified: Secondary | ICD-10-CM | POA: Diagnosis present

## 2023-07-15 DIAGNOSIS — J811 Chronic pulmonary edema: Secondary | ICD-10-CM | POA: Diagnosis present

## 2023-07-15 DIAGNOSIS — R531 Weakness: Secondary | ICD-10-CM | POA: Diagnosis not present

## 2023-07-15 DIAGNOSIS — E785 Hyperlipidemia, unspecified: Secondary | ICD-10-CM | POA: Diagnosis present

## 2023-07-15 DIAGNOSIS — I120 Hypertensive chronic kidney disease with stage 5 chronic kidney disease or end stage renal disease: Secondary | ICD-10-CM | POA: Diagnosis present

## 2023-07-15 LAB — I-STAT VENOUS BLOOD GAS, ED
Acid-Base Excess: 9 mmol/L — ABNORMAL HIGH (ref 0.0–2.0)
Bicarbonate: 30.1 mmol/L — ABNORMAL HIGH (ref 20.0–28.0)
Calcium, Ion: 0.97 mmol/L — ABNORMAL LOW (ref 1.15–1.40)
HCT: 30 % — ABNORMAL LOW (ref 36.0–46.0)
Hemoglobin: 10.2 g/dL — ABNORMAL LOW (ref 12.0–15.0)
O2 Saturation: 99 %
Potassium: 3.5 mmol/L (ref 3.5–5.1)
Sodium: 137 mmol/L (ref 135–145)
TCO2: 31 mmol/L (ref 22–32)
pCO2, Ven: 27.9 mmHg — ABNORMAL LOW (ref 44–60)
pH, Ven: 7.641 (ref 7.25–7.43)
pO2, Ven: 101 mmHg — ABNORMAL HIGH (ref 32–45)

## 2023-07-15 LAB — CBC WITH DIFFERENTIAL/PLATELET
Abs Immature Granulocytes: 0.02 10*3/uL (ref 0.00–0.07)
Basophils Absolute: 0.1 10*3/uL (ref 0.0–0.1)
Basophils Relative: 1 %
Eosinophils Absolute: 1.4 10*3/uL — ABNORMAL HIGH (ref 0.0–0.5)
Eosinophils Relative: 20 %
HCT: 30.5 % — ABNORMAL LOW (ref 36.0–46.0)
Hemoglobin: 9.8 g/dL — ABNORMAL LOW (ref 12.0–15.0)
Immature Granulocytes: 0 %
Lymphocytes Relative: 11 %
Lymphs Abs: 0.7 10*3/uL (ref 0.7–4.0)
MCH: 30.9 pg (ref 26.0–34.0)
MCHC: 32.1 g/dL (ref 30.0–36.0)
MCV: 96.2 fL (ref 80.0–100.0)
Monocytes Absolute: 0.4 10*3/uL (ref 0.1–1.0)
Monocytes Relative: 6 %
Neutro Abs: 4.3 10*3/uL (ref 1.7–7.7)
Neutrophils Relative %: 62 %
Platelets: 252 10*3/uL (ref 150–400)
RBC: 3.17 MIL/uL — ABNORMAL LOW (ref 3.87–5.11)
RDW: 14.4 % (ref 11.5–15.5)
WBC: 6.9 10*3/uL (ref 4.0–10.5)
nRBC: 0 % (ref 0.0–0.2)

## 2023-07-15 LAB — BRAIN NATRIURETIC PEPTIDE: B Natriuretic Peptide: 2865.9 pg/mL — ABNORMAL HIGH (ref 0.0–100.0)

## 2023-07-15 LAB — COMPREHENSIVE METABOLIC PANEL WITH GFR
ALT: 8 U/L (ref 0–44)
AST: 22 U/L (ref 15–41)
Albumin: 3.4 g/dL — ABNORMAL LOW (ref 3.5–5.0)
Alkaline Phosphatase: 75 U/L (ref 38–126)
Anion gap: 15 (ref 5–15)
BUN: 15 mg/dL (ref 8–23)
CO2: 27 mmol/L (ref 22–32)
Calcium: 9.1 mg/dL (ref 8.9–10.3)
Chloride: 95 mmol/L — ABNORMAL LOW (ref 98–111)
Creatinine, Ser: 8.04 mg/dL — ABNORMAL HIGH (ref 0.44–1.00)
GFR, Estimated: 5 mL/min — ABNORMAL LOW (ref >60)
Glucose, Bld: 89 mg/dL (ref 70–99)
Potassium: 3.4 mmol/L — ABNORMAL LOW (ref 3.5–5.1)
Sodium: 137 mmol/L (ref 135–145)
Total Bilirubin: 1.5 mg/dL — ABNORMAL HIGH (ref 0.0–1.2)
Total Protein: 8 g/dL (ref 6.5–8.1)

## 2023-07-15 LAB — TROPONIN I (HIGH SENSITIVITY)
Troponin I (High Sensitivity): 32 ng/L — ABNORMAL HIGH (ref <18)
Troponin I (High Sensitivity): 26 ng/L — ABNORMAL HIGH (ref <18)

## 2023-07-15 LAB — D-DIMER, QUANTITATIVE: D-Dimer, Quant: 2.39 ug{FEU}/mL — ABNORMAL HIGH (ref 0.00–0.50)

## 2023-07-15 LAB — HEPATITIS B SURFACE ANTIGEN: Hepatitis B Surface Ag: NONREACTIVE

## 2023-07-15 LAB — HEPATITIS PANEL, ACUTE
HCV Ab: NONREACTIVE
Hep A IgM: NONREACTIVE
Hep B C IgM: NONREACTIVE
Hepatitis B Surface Ag: NONREACTIVE

## 2023-07-15 MED ORDER — PANTOPRAZOLE SODIUM 40 MG PO TBEC
40.0000 mg | DELAYED_RELEASE_TABLET | Freq: Every day | ORAL | Status: DC
  Administered 2023-07-16 – 2023-07-18 (×3): 40 mg via ORAL
  Filled 2023-07-15 (×3): qty 1

## 2023-07-15 MED ORDER — MAGNESIUM OXIDE -MG SUPPLEMENT 400 (240 MG) MG PO TABS
400.0000 mg | ORAL_TABLET | Freq: Two times a day (BID) | ORAL | Status: DC
  Administered 2023-07-16 – 2023-07-18 (×6): 400 mg via ORAL
  Filled 2023-07-15 (×6): qty 1

## 2023-07-15 MED ORDER — ONDANSETRON HCL 4 MG/2ML IJ SOLN: 4.0000 mg | Freq: Four times a day (QID) | INTRAMUSCULAR | Status: DC | PRN

## 2023-07-15 MED ORDER — PREDNISONE 5 MG PO TABS
2.5000 mg | ORAL_TABLET | Freq: Every day | ORAL | Status: DC
  Administered 2023-07-16 – 2023-07-18 (×3): 2.5 mg via ORAL
  Filled 2023-07-15 (×3): qty 1

## 2023-07-15 MED ORDER — SIMVASTATIN 20 MG PO TABS
20.0000 mg | ORAL_TABLET | Freq: Every evening | ORAL | Status: DC
  Administered 2023-07-15 – 2023-07-17 (×3): 20 mg via ORAL
  Filled 2023-07-15 (×3): qty 1

## 2023-07-15 MED ORDER — CLOPIDOGREL BISULFATE 75 MG PO TABS
75.0000 mg | ORAL_TABLET | Freq: Every day | ORAL | Status: DC
  Administered 2023-07-16 – 2023-07-18 (×3): 75 mg via ORAL
  Filled 2023-07-15 (×3): qty 1

## 2023-07-15 MED ORDER — ONDANSETRON HCL 4 MG PO TABS
4.0000 mg | ORAL_TABLET | Freq: Four times a day (QID) | ORAL | Status: DC | PRN
Start: 2023-07-15 — End: 2023-07-18

## 2023-07-15 MED ORDER — ASPIRIN 81 MG PO TBEC
81.0000 mg | DELAYED_RELEASE_TABLET | Freq: Every day | ORAL | Status: DC
  Administered 2023-07-16 – 2023-07-18 (×3): 81 mg via ORAL
  Filled 2023-07-15 (×3): qty 1

## 2023-07-15 MED ORDER — CALCITRIOL 0.25 MCG PO CAPS
0.2500 ug | ORAL_CAPSULE | ORAL | Status: DC
  Administered 2023-07-15 – 2023-07-17 (×2): 0.25 ug via ORAL
  Filled 2023-07-15 (×4): qty 1

## 2023-07-15 MED ORDER — IOHEXOL 350 MG/ML SOLN
75.0000 mL | Freq: Once | INTRAVENOUS | Status: AC | PRN
  Administered 2023-07-15: 75 mL via INTRAVENOUS

## 2023-07-15 MED ORDER — ACETAMINOPHEN 325 MG PO TABS: 650.0000 mg | ORAL_TABLET | Freq: Four times a day (QID) | ORAL | Status: DC | PRN

## 2023-07-15 MED ORDER — SEVELAMER CARBONATE 800 MG PO TABS: 800.0000 mg | ORAL_TABLET | ORAL | Status: DC | PRN

## 2023-07-15 MED ORDER — HYDRALAZINE HCL 50 MG PO TABS
100.0000 mg | ORAL_TABLET | Freq: Three times a day (TID) | ORAL | Status: DC
  Administered 2023-07-15 – 2023-07-18 (×7): 100 mg via ORAL
  Filled 2023-07-15 (×7): qty 2

## 2023-07-15 MED ORDER — SENNA 8.6 MG PO TABS
1.0000 | ORAL_TABLET | Freq: Two times a day (BID) | ORAL | Status: DC
  Administered 2023-07-17 (×2): 8.6 mg via ORAL
  Filled 2023-07-15 (×6): qty 1

## 2023-07-15 MED ORDER — SODIUM CHLORIDE 0.9 % IV SOLN: 250.0000 mL | INTRAVENOUS | Status: DC | PRN

## 2023-07-15 MED ORDER — CHLORHEXIDINE GLUCONATE CLOTH 2 % EX PADS: 6.0000 | MEDICATED_PAD | Freq: Every day | CUTANEOUS | Status: DC

## 2023-07-15 MED ORDER — SODIUM CHLORIDE 0.9% FLUSH
3.0000 mL | Freq: Two times a day (BID) | INTRAVENOUS | Status: DC
  Administered 2023-07-16 (×2): 3 mL via INTRAVENOUS

## 2023-07-15 MED ORDER — LIDOCAINE HCL (PF) 1 % IJ SOLN: 5.0000 mL | INTRAMUSCULAR | Status: DC | PRN

## 2023-07-15 MED ORDER — SEVELAMER CARBONATE 800 MG PO TABS
800.0000 mg | ORAL_TABLET | Freq: Three times a day (TID) | ORAL | Status: DC
  Administered 2023-07-15 – 2023-07-16 (×2): 800 mg via ORAL
  Filled 2023-07-15 (×2): qty 1

## 2023-07-15 MED ORDER — SODIUM CHLORIDE 0.9% FLUSH: 3.0000 mL | INTRAVENOUS | Status: DC | PRN

## 2023-07-15 MED ORDER — HEPARIN SODIUM (PORCINE) 5000 UNIT/ML IJ SOLN
5000.0000 [IU] | Freq: Three times a day (TID) | INTRAMUSCULAR | Status: DC
  Administered 2023-07-15 – 2023-07-18 (×9): 5000 [IU] via SUBCUTANEOUS
  Filled 2023-07-15 (×8): qty 1

## 2023-07-15 NOTE — H&P (Signed)
 ADMISSION HISTORY AND PHYSICAL   Karen [name] NWG:[id number] DOB: 05-22-1955 DOA: 07/15/2023  PCP: Bufford Buttner, MD Patient coming from: Home via [facility] ER  Chief Complaint: Shortness of breath  HPI: 68 year old patient with a history of ESRD on HD TTS, renal transplant 2017, HLD, HTN, seizure disorder, and PVD who presented to the ER with gradually progressive shortness of breath over the past 2 to 3 days.  She has not missed any dialysis and has had full sessions each visit.  She denies dietary noncompliance.  On arrival she was not markedly hypoxic but she was clearly dyspneic with tachypnea up to 33 breaths/min.  Lung exam revealed bilateral crackles.   Assessment/Plan  Volume overload Management per dialysis - Nephrology following - felt to be simple volume overload related to need to adjust her EDW target   ESRD on HD TTS Nephrology following and will attend to dialysis  HTN -uncontrolled likely a consequence of volume overload - f/u after HD   HLD Continue usual home medical therapy  Seizure disorder Continue usual home medications  Status post renal transplant 2017 with subsequent failure Is on chronic prednisone  DVT prophylaxis: Subcutaneous heparin Code Status:   Code Status: Prior Family Communication: spoke w/ brother at bedside  Disposition Plan:  Admit to Inpatient   Review of Systems: As per HPI otherwise 10 point review of systems negative.   Past Medical History:  Diagnosis Date   Anemia associated with chronic renal failure    Arthritis    Convulsions/seizures (HCC) 01/28/2013   Dyslipidemia    GERD (gastroesophageal reflux disease)    Hyperlipidemia    Hypertension    Kidney transplanted    Peripheral vascular disease (HCC)    Seizure disorder (HCC)    Stroke (HCC)    "possible mini stroke" years ago per patient- patient no longer sees neurologist    Past Surgical History:  Procedure Laterality Date   A/V FISTULAGRAM N/A  03/15/2023   Procedure: A/V Fistulagram;  Surgeon: Ethelene Hal, MD;  Location: [facility] INVASIVE CV LAB;  Service: Cardiovascular;  Laterality: N/A;   ABDOMINAL AORTOGRAM W/LOWER EXTREMITY Left 04/25/2023   Procedure: ABDOMINAL AORTOGRAM W/LOWER EXTREMITY;  Surgeon: Cephus Shelling, MD;  Location: [facility] INVASIVE CV LAB;  Service: Cardiovascular;  Laterality: Left;   CATHETER REMOVAL     COLONOSCOPY     GALLBLADDER SURGERY     INSERTION OF DIALYSIS CATHETER Left 08/11/2021   Procedure: INSERTION LEFT INTERNAL JUGULAR TUNNELED DIALYSIS CATHETER;  Surgeon: Cephus Shelling, MD;  Location: [facility] OR;  Service: Vascular;  Laterality: Left;   KIDNEY TRANSPLANT     04/10/2009   PERIPHERAL VASCULAR BALLOON ANGIOPLASTY Left 04/25/2023   Procedure: PERIPHERAL VASCULAR BALLOON ANGIOPLASTY;  Surgeon: Cephus Shelling, MD;  Location: [facility] INVASIVE CV LAB;  Service: Cardiovascular;  Laterality: Left;   REVISON OF ARTERIOVENOUS FISTULA Right 08/11/2021   Procedure: RIGHT ARM ARTERIOVENOUS FISTULA REVISION WITH ANEURYSM REMOVAL;  Surgeon: Cephus Shelling, MD;  Location: [facility] OR;  Service: Vascular;  Laterality: Right;    Family History  Family History  Problem Relation Age of Onset   Stroke Mother    Seizures Mother    Migraines Mother    Diabetes Brother    Colon cancer Neg Hx    Esophageal cancer Neg Hx    Stomach cancer Neg Hx    Rectal cancer Neg Hx     Social History   reports that she has quit smoking. Her smoking use included  cigarettes. She has never been exposed to tobacco smoke. She has never used smokeless tobacco. She reports that she does not drink alcohol and does not use drugs.  Allergies No Known Allergies  Prior to Admission medications   Medication Sig Start Date End Date Taking? Authorizing Provider  amoxicillin (AMOXIL) 250 MG capsule Take 250 mg by mouth daily. 07/10/23  Yes [provider]  aspirin EC 81 MG tablet Take 81 mg by mouth daily.    [provider]  calcitRIOL (ROCALTROL) 0.25 MCG capsule Take 0.25 mcg by mouth every Monday, Wednesday, and Friday.    [provider]  clopidogrel (PLAVIX) 75 MG tablet Take 1 tablet (75 mg total) by mouth daily. 04/25/23 04/24/24  Cephus Shelling, MD  Doxercalciferol (HECTOROL IV) Doxercalciferol (Hectorol) 02/16/23 02/15/24  [provider]  hydrALAZINE (APRESOLINE) 100 MG tablet Take 100 mg by mouth daily as needed (for systolic blood pressure of 157 and above).    [provider]  magnesium oxide (MAG-OX) 400 MG tablet Take 400 mg by mouth 2 (two) times daily.     [provider]  omeprazole (PRILOSEC) 20 MG capsule Take 20 mg by mouth daily.    [provider]  predniSONE (DELTASONE) 2.5 MG tablet Take 2.5 mg by mouth daily.    [provider]  sevelamer carbonate (RENVELA) 800 MG tablet Take 1,600 mg by mouth 3 (three) times daily with meals. Take 800 mg tablet with snacks 02/21/22   [provider]  simvastatin (ZOCOR) 20 MG tablet Take 20 mg by mouth every evening.    [provider]  VENTOLIN HFA 108 (90 Base) MCG/ACT inhaler Inhale 1 puff into the lungs 3 times/day as needed-between meals & bedtime. 06/21/21   Coralie Keens, MD    Physical Exam: Vitals:   07/15/23 1430 07/15/23 1435 07/15/23 1500 07/15/23 1530  BP: (!) 176/74  (!) 170/75 (!) 168/74  Pulse: 87  92 88  Resp: (!) 24  16 20   Temp:  97.7 F (36.5 C)    TempSrc:  Oral    SpO2: 98%  99% 98%  Weight:      Height:        Constitutional: NAD, calm, comfortable ENMT: Mucous membranes are moist.  Respiratory: mild diffuse crackles - no wheeze - blunting of breath sounds B bases Cardiovascular: Regular rate and rhythm, no murmurs / rubs / gallops. No extremity edema. 2+ pedal pulses.  Abdomen: No tenderness or masses to palpation. No hepatosplenomegaly. Bowel sounds positive. Not distended. Soft.  Musculoskeletal: No clubbing / cyanosis. No joint  deformity upper and lower extremities. No contractures. Normal muscle tone.  Neurologic: CN 2-12 grossly intact B.  Psychiatric: Normal judgment and insight. Alert and oriented x 3. Normal mood.    Labs on Admission:   CBC: Recent Labs  Lab 07/15/23 1133 07/15/23 1147  WBC 6.9  --   NEUTROABS 4.3  --   HGB 9.8* 10.2*  HCT 30.5* 30.0*  MCV 96.2  --   PLT 252  --    Basic Metabolic Panel: Recent Labs  Lab 07/15/23 1133 07/15/23 1147  NA 137 137  K 3.4* 3.5  CL 95*  --   CO2 27  --   GLUCOSE 89  --   BUN 15  --   CREATININE 8.04*  --   CALCIUM 9.1  --    GFR: Estimated Creatinine Clearance: 5.7 mL/min (A) (by C-G formula based on SCr of 8.04 mg/dL (  H)).  Liver Function Tests: Recent Labs  Lab 07/15/23 1133  AST 22  ALT 8  ALKPHOS 75  BILITOT 1.5*  PROT 8.0  ALBUMIN 3.4*    Radiological Exams on Admission: DG Chest Port 1 View Result Date: 07/15/2023 IMPRESSION: Moderate right effusion, decreased from previous however. New small left effusion. Increasing lung base opacities and interstitial changes. Enlarged heart. Electronically Signed   By: Adrianna Horde M.D.   On: 07/15/2023 14:05   EKG: Independently reviewed.   Abbe Abate, MD [facility] Office  [phone] Pager - Text Page per Amion as per below:  On-Call/Text Page:      [email or web address]  If 7PM-7AM, please contact night-coverage [email or web address] 07/15/2023, 4:13 PM

## 2023-07-15 NOTE — ED Notes (Signed)
Placed on 2L via NC.

## 2023-07-15 NOTE — ED Notes (Signed)
Help get patient into a gown on the monitor patient is resting with call bell in reach

## 2023-07-15 NOTE — ED Provider Notes (Signed)
 [place] EMERGENCY DEPARTMENT AT [facility] Provider Note  CSN: [id number] Arrival date & time: 07/15/23 1036  Chief Complaint(s) Shortness of Breath  HPI Karen [name] is a 68 y.o. female history of end-stage renal disease on Tuesday, Thursday, Saturday dialysis, hyperlipidemia, hypertension, peripheral vascular disease presenting to the emergency department shortness of breath.  Patient reports shortness of breath which is been worsening over the past few days.  Reports that she has been compliant with dialysis, last dialysis was Saturday, had full session.  Reports that she had persistent shortness of breath as well as developing dry cough, orthopnea.  No fevers.  No chills.  Denies any chest pain.  Reports some nausea.  Has been compliant with all of her other home medications.  She reports she makes very little urine.  She has been weighing herself and feels that she is about her dry weight which is 62.5.   Past Medical History Past Medical History:  Diagnosis Date   Anemia associated with chronic renal failure    Arthritis    Convulsions/seizures (HCC) 01/28/2013   Dyslipidemia    GERD (gastroesophageal reflux disease)    Hyperlipidemia    Hypertension    Kidney transplanted    Peripheral vascular disease (HCC)    Seizure disorder (HCC)    Stroke (HCC)    "possible mini stroke" years ago per patient- patient no longer sees neurologist   Patient Active Problem List   Diagnosis Date Noted   Volume overload 07/15/2023   Critical limb ischemia of left lower extremity (HCC) 04/16/2023   Malnutrition of moderate degree 03/01/2022   Community acquired pneumonia of left lung 02/27/2022   ESRD on hemodialysis (HCC) 08/04/2021   Hypertension 06/15/2021   Dyslipidemia 06/15/2021   Renal transplant recipient 08/13/2015   History of seizure 01/28/2013   Home Medication(s) Prior to Admission medications   Medication Sig Start Date End Date Taking? Authorizing Provider   amoxicillin (AMOXIL) 250 MG capsule Take 250 mg by mouth daily. 07/10/23  Yes [provider]  aspirin EC 81 MG tablet Take 81 mg by mouth daily.   Yes [provider]  calcitRIOL (ROCALTROL) 0.25 MCG capsule Take 0.25 mcg by mouth every Monday, Wednesday, and Friday.   Yes [provider]  clopidogrel (PLAVIX) 75 MG tablet Take 1 tablet (75 mg total) by mouth daily. 04/25/23 04/24/24 Yes Young Hensen, MD  Doxercalciferol (HECTOROL IV) Doxercalciferol (Hectorol) 02/16/23 02/15/24 Yes [provider]  hydrALAZINE (APRESOLINE) 100 MG tablet Take 100 mg by mouth 3 (three) times daily.   Yes [provider]  magnesium oxide (MAG-OX) 400 MG tablet Take 400 mg by mouth 2 (two) times daily.    Yes [provider]  omeprazole (PRILOSEC) 20 MG capsule Take 20 mg by mouth daily.   Yes [provider]  predniSONE (DELTASONE) 2.5 MG tablet Take 2.5 mg by mouth daily.   Yes [provider]  sevelamer carbonate (RENVELA) 800 MG tablet Take 800 mg by mouth 3 (three) times daily with meals. Take 800 mg tablet with snacks 02/21/22  Yes [provider]  simvastatin (ZOCOR) 20 MG tablet Take 20 mg by mouth every evening.   Yes [provider]  VENTOLIN HFA 108 (90 Base) MCG/ACT inhaler Inhale 1 puff into the lungs 3 times/day as needed-between meals & bedtime. Patient taking differently: Inhale 1 puff into the lungs 3 times/day as needed-between meals & bedtime for shortness of breath. 06/21/21  Yes Arrien, Curlee Doss, MD  Past Surgical History Past Surgical History:  Procedure Laterality Date   A/V FISTULAGRAM N/A 03/15/2023   Procedure: A/V Fistulagram;  Surgeon: Ethelene Hal, MD;  Location: [facility] INVASIVE CV LAB;  Service: Cardiovascular;  Laterality: N/A;   ABDOMINAL AORTOGRAM W/LOWER  EXTREMITY Left 04/25/2023   Procedure: ABDOMINAL AORTOGRAM W/LOWER EXTREMITY;  Surgeon: Cephus Shelling, MD;  Location: [facility] INVASIVE CV LAB;  Service: Cardiovascular;  Laterality: Left;   CATHETER REMOVAL     COLONOSCOPY     GALLBLADDER SURGERY     INSERTION OF DIALYSIS CATHETER Left 08/11/2021   Procedure: INSERTION LEFT INTERNAL JUGULAR TUNNELED DIALYSIS CATHETER;  Surgeon: Cephus Shelling, MD;  Location: [facility] OR;  Service: Vascular;  Laterality: Left;   KIDNEY TRANSPLANT     04/10/2009   PERIPHERAL VASCULAR BALLOON ANGIOPLASTY Left 04/25/2023   Procedure: PERIPHERAL VASCULAR BALLOON ANGIOPLASTY;  Surgeon: Cephus Shelling, MD;  Location: [facility] INVASIVE CV LAB;  Service: Cardiovascular;  Laterality: Left;   REVISON OF ARTERIOVENOUS FISTULA Right 08/11/2021   Procedure: RIGHT ARM ARTERIOVENOUS FISTULA REVISION WITH ANEURYSM REMOVAL;  Surgeon: Cephus Shelling, MD;  Location: [facility] OR;  Service: Vascular;  Laterality: Right;   Family History Family History  Problem Relation Age of Onset   Stroke Mother    Seizures Mother    Migraines Mother    Diabetes Brother    Colon cancer Neg Hx    Esophageal cancer Neg Hx    Stomach cancer Neg Hx    Rectal cancer Neg Hx     Social History Social History   Tobacco Use   Smoking status: Former    Types: Cigarettes    Passive exposure: Never   Smokeless tobacco: Never   Tobacco comments:    quit smoking 25 yrs ago  Vaping Use   Vaping status: Never Used  Substance Use Topics   Alcohol use: No    Comment: quit 25 years ago   Drug use: No   Allergies Patient has no known allergies.  Review of Systems Review of Systems  All other systems reviewed and are negative.   Physical Exam Vital Signs  I have reviewed the triage vital signs BP (!) 168/74   Pulse 88   Temp 97.7 F (36.5 C) (Oral)   Resp 20   Ht 5' (1.524 m)   Wt 62.8 kg   SpO2 98%   BMI 27.04 kg/m  Physical Exam Vitals and nursing note reviewed.   Constitutional:      General: She is in acute distress.     Appearance: She is well-developed.  HENT:     Head: Normocephalic and atraumatic.     Mouth/Throat:     Mouth: Mucous membranes are moist.  Eyes:     Pupils: Pupils are equal, round, and reactive to light.  Neck:     Vascular: JVD present.  Cardiovascular:     Rate and Rhythm: Normal rate and regular rhythm.     Heart sounds: No murmur heard. Pulmonary:     Effort: Tachypnea, accessory muscle usage and respiratory distress (mild) present.     Breath sounds: Examination of the right-middle field reveals rales. Examination of the left-middle field reveals rales. Examination of the right-lower field reveals rales. Examination of the left-lower field reveals rales. Rales present.  Abdominal:     General: Abdomen is flat.     Palpations: Abdomen is soft.     Tenderness: There is no abdominal tenderness.  Musculoskeletal:  General: No tenderness.     Right lower leg: No edema.     Left lower leg: No edema.  Skin:    General: Skin is warm and dry.  Neurological:     General: No focal deficit present.     Mental Status: She is alert. Mental status is at baseline.  Psychiatric:        Mood and Affect: Mood normal.        Behavior: Behavior normal.     ED Results and Treatments Labs (all labs ordered are listed, but only abnormal results are displayed) Labs Reviewed  COMPREHENSIVE METABOLIC PANEL WITH GFR - Abnormal; Notable for the following components:      Result Value   Potassium 3.4 (*)    Chloride 95 (*)    Creatinine, Ser 8.04 (*)    Albumin 3.4 (*)    Total Bilirubin 1.5 (*)    GFR, Estimated 5 (*)    All other components within normal limits  CBC WITH DIFFERENTIAL/PLATELET - Abnormal; Notable for the following components:   RBC 3.17 (*)    Hemoglobin 9.8 (*)    HCT 30.5 (*)    Eosinophils Absolute 1.4 (*)    All other components within normal limits  BRAIN NATRIURETIC PEPTIDE - Abnormal; Notable  for the following components:   B Natriuretic Peptide 2,865.9 (*)    All other components within normal limits  D-DIMER, QUANTITATIVE - Abnormal; Notable for the following components:   D-Dimer, Quant 2.39 (*)    All other components within normal limits  I-STAT VENOUS BLOOD GAS, ED - Abnormal; Notable for the following components:   pH, Ven 7.641 (*)    pCO2, Ven 27.9 (*)    pO2, Ven 101 (*)    Bicarbonate 30.1 (*)    Acid-Base Excess 9.0 (*)    Calcium, Ion 0.97 (*)    HCT 30.0 (*)    Hemoglobin 10.2 (*)    All other components within normal limits  TROPONIN I (HIGH SENSITIVITY) - Abnormal; Notable for the following components:   Troponin I (High Sensitivity) 32 (*)    All other components within normal limits  TROPONIN I (HIGH SENSITIVITY) - Abnormal; Notable for the following components:   Troponin I (High Sensitivity) 26 (*)    All other components within normal limits  HEPATITIS B SURFACE ANTIGEN  HEPATITIS B SURFACE ANTIBODY, QUANTITATIVE                                                                                                                          Radiology DG Chest Port 1 View Result Date: 07/15/2023 CLINICAL DATA:  Shortness of breath EXAM: PORTABLE CHEST 1 VIEW COMPARISON:  X-ray 02/27/2022 and older FINDINGS: Persistent moderate right effusion, slightly decreased today from previous. Small left effusion is new. Increasing lung base opacities with some interstitial components. Enlarged cardiopericardial silhouette. Question component of edema. No pneumothorax. Overlapping cardiac leads. Prominent calcifications along the tracheobronchial tree. IMPRESSION: Moderate  right effusion, decreased from previous however. New small left effusion. Increasing lung base opacities and interstitial changes. Enlarged heart. Electronically Signed   By: Karen Kays M.D.   On: 07/15/2023 14:05    Pertinent labs & imaging results that were available during my care of the patient were  reviewed by me and considered in my medical decision making (see MDM for details).  Medications Ordered in ED Medications  aspirin EC tablet 81 mg (has no administration in time range)  calcitRIOL (ROCALTROL) capsule 0.25 mcg (has no administration in time range)  clopidogrel (PLAVIX) tablet 75 mg (has no administration in time range)  hydrALAZINE (APRESOLINE) tablet 100 mg (has no administration in time range)  magnesium oxide (MAG-OX) tablet 400 mg (has no administration in time range)  pantoprazole (PROTONIX) EC tablet 40 mg (has no administration in time range)  predniSONE (DELTASONE) tablet 2.5 mg (has no administration in time range)  sevelamer carbonate (RENVELA) tablet 800 mg (has no administration in time range)  simvastatin (ZOCOR) tablet 20 mg (has no administration in time range)  Chlorhexidine Gluconate Cloth 2 % PADS 6 each (has no administration in time range)                                                                                                                                     Procedures Procedures  (including critical care time)  Medical Decision Making / ED Course   MDM:  68 year old presenting with shortness of breath.  Patient with mild respiratory distress with accessory muscle use, not hypoxic, exam with crackles to mid lung field bilaterally.  Suspect likely volume overloaded, however, unclear cause as patient reports she has been compliant with dialysis, and is roughly at her dry weight.  Differential also includes CHF, pneumonia, pneumothorax.  Given alternative cause of shortness of breath with focal pulmonary findings and signs of volume overload lower concern for other process such as pulmonary embolism.  Will check labs, chest x-ray, reassess.  Given work of breathing anticipate patient will probably need HD or possibly admission. Clinical Course as of 07/15/23 1621  Mon Jul 15, 2023  1455 Discussed with Dr. Valaria Good who wil consult [WS]  1619  D-dimer elevated so CT scan performed.  On my interpretation, patient has bilateral pleural effusions, some bibasilar atelectasis.  No obvious large PE at this time.  Signed out to the hospitalist Dr. Sharon Seller pending final CT report. [WS]    Clinical Course User Index [WS] Lonell Grandchild, MD     Additional history obtained: -Additional history obtained from family and ems -External records from outside source obtained and reviewed including: Chart review including previous notes, labs, imaging, consultation notes including prior notes    Lab Tests: -I ordered, reviewed, and interpreted labs.   The pertinent results include:   Labs Reviewed  COMPREHENSIVE METABOLIC PANEL WITH GFR - Abnormal; Notable for the following components:  Result Value   Potassium 3.4 (*)    Chloride 95 (*)    Creatinine, Ser 8.04 (*)    Albumin 3.4 (*)    Total Bilirubin 1.5 (*)    GFR, Estimated 5 (*)    All other components within normal limits  CBC WITH DIFFERENTIAL/PLATELET - Abnormal; Notable for the following components:   RBC 3.17 (*)    Hemoglobin 9.8 (*)    HCT 30.5 (*)    Eosinophils Absolute 1.4 (*)    All other components within normal limits  BRAIN NATRIURETIC PEPTIDE - Abnormal; Notable for the following components:   B Natriuretic Peptide 2,865.9 (*)    All other components within normal limits  D-DIMER, QUANTITATIVE - Abnormal; Notable for the following components:   D-Dimer, Quant 2.39 (*)    All other components within normal limits  I-STAT VENOUS BLOOD GAS, ED - Abnormal; Notable for the following components:   pH, Ven 7.641 (*)    pCO2, Ven 27.9 (*)    pO2, Ven 101 (*)    Bicarbonate 30.1 (*)    Acid-Base Excess 9.0 (*)    Calcium, Ion 0.97 (*)    HCT 30.0 (*)    Hemoglobin 10.2 (*)    All other components within normal limits  TROPONIN I (HIGH SENSITIVITY) - Abnormal; Notable for the following components:   Troponin I (High Sensitivity) 32 (*)    All other  components within normal limits  TROPONIN I (HIGH SENSITIVITY) - Abnormal; Notable for the following components:   Troponin I (High Sensitivity) 26 (*)    All other components within normal limits  HEPATITIS B SURFACE ANTIGEN  HEPATITIS B SURFACE ANTIBODY, QUANTITATIVE    Notable for elevated BNP, elevated troponin likely demand related   EKG   EKG Interpretation Date/Time:  Monday July 15 2023 13:45:26 EDT Ventricular Rate:  87 PR Interval:  131 QRS Duration:  82 QT Interval:  454 QTC Calculation: 547 R Axis:   74  Text Interpretation: Sinus rhythm Probable left atrial enlargement Probable left ventricular hypertrophy Borderline T abnormalities, lateral leads Prolonged QT interval Confirmed by Hiawatha Lout ([id number]) on 07/15/2023 1:49:59 PM         Imaging Studies ordered: I ordered imaging studies including CTA chest  On my interpretation imaging demonstrates no PE  I independently visualized and interpreted imaging. I agree with the radiologist interpretation   Medicines ordered and prescription drug management: Meds ordered this encounter  Medications   aspirin EC tablet 81 mg   calcitRIOL (ROCALTROL) capsule 0.25 mcg   clopidogrel (PLAVIX) tablet 75 mg   hydrALAZINE (APRESOLINE) tablet 100 mg   magnesium oxide (MAG-OX) tablet 400 mg   pantoprazole (PROTONIX) EC tablet 40 mg   predniSONE (DELTASONE) tablet 2.5 mg   sevelamer carbonate (RENVELA) tablet 800 mg    Take 800 mg tablet with snacks     simvastatin (ZOCOR) tablet 20 mg   Chlorhexidine Gluconate Cloth 2 % PADS 6 each    -I have reviewed the patients home medicines and have made adjustments as needed   Consultations Obtained: I requested consultation with the nephrologist,  and discussed lab and imaging findings as well as pertinent plan - they recommend: plan for HD    Cardiac Monitoring: The patient was maintained on a cardiac monitor.  I personally viewed and interpreted the cardiac monitored  which showed an underlying rhythm of: NSR  Reevaluation: After the interventions noted above, I reevaluated the patient and found that their symptoms  have improved  Co morbidities that complicate the patient evaluation  Past Medical History:  Diagnosis Date   Anemia associated with chronic renal failure    Arthritis    Convulsions/seizures (HCC) 01/28/2013   Dyslipidemia    GERD (gastroesophageal reflux disease)    Hyperlipidemia    Hypertension    Kidney transplanted    Peripheral vascular disease (HCC)    Seizure disorder (HCC)    Stroke (HCC)    "possible mini stroke" years ago per patient- patient no longer sees neurologist      Dispostion: Disposition decision including need for hospitalization was considered, and patient admitted to the hospital.    Final Clinical Impression(s) / ED Diagnoses Final diagnoses:  Hypervolemia associated with renal insufficiency     This chart was dictated using voice recognition software.  Despite best efforts to proofread,  errors can occur which can change the documentation meaning.    Mordecai Applebaum, MD 07/15/23 [phone]

## 2023-07-15 NOTE — ED Triage Notes (Signed)
 From home, t,th,sat has not missed HD. SHOB since Saturday.

## 2023-07-15 NOTE — Consult Note (Signed)
 Renal Service Consult Note [facility]  Karen [name] 07/15/2023 Maree Krabbe, MD Requesting Physician: Dr. Suezanne Jacquet  Reason for Consult: ESRD pt w/ SOB HPI: The patient is a 68 y.o. year-old w/ PMH as below who presented to ED today c/o SOB. Started on Sat, 2 days ago. Had HD last Sat as well. Has not missed. Had dry wt lowered in last few weeks. Doesn't eat a lot per family. In ED BP 1780/90, 2 L [place] required. CXR c/w pulm edema + R effusion. Pt to be admitted. We are asked to see for dialysis.     Pt seen in ED room. Hx as above. Doesn't get swollen legs w/ extra fluid on. Does c/o orthopnea x 1-2 nights. Has to sit up to breath.    ROS - denies CP, no joint pain, no HA, no blurry vision, no rash, no diarrhea, no nausea/ vomiting  PMH: Anemia DJD Seizures HL HTN H/o failed renal transplant PAD H/o CVA  Past Surgical History  Past Surgical History:  Procedure Laterality Date   A/V FISTULAGRAM N/A 03/15/2023   Procedure: A/V Fistulagram;  Surgeon: Ethelene Hal, MD;  Location: [facility] INVASIVE CV LAB;  Service: Cardiovascular;  Laterality: N/A;   ABDOMINAL AORTOGRAM W/LOWER EXTREMITY Left 04/25/2023   Procedure: ABDOMINAL AORTOGRAM W/LOWER EXTREMITY;  Surgeon: Cephus Shelling, MD;  Location: [facility] INVASIVE CV LAB;  Service: Cardiovascular;  Laterality: Left;   CATHETER REMOVAL     COLONOSCOPY     GALLBLADDER SURGERY     INSERTION OF DIALYSIS CATHETER Left 08/11/2021   Procedure: INSERTION LEFT INTERNAL JUGULAR TUNNELED DIALYSIS CATHETER;  Surgeon: Cephus Shelling, MD;  Location: [facility] OR;  Service: Vascular;  Laterality: Left;   KIDNEY TRANSPLANT     04/10/2009   PERIPHERAL VASCULAR BALLOON ANGIOPLASTY Left 04/25/2023   Procedure: PERIPHERAL VASCULAR BALLOON ANGIOPLASTY;  Surgeon: Cephus Shelling, MD;  Location: [facility] INVASIVE CV LAB;  Service: Cardiovascular;  Laterality: Left;   REVISON OF ARTERIOVENOUS FISTULA Right 08/11/2021   Procedure: RIGHT ARM  ARTERIOVENOUS FISTULA REVISION WITH ANEURYSM REMOVAL;  Surgeon: Cephus Shelling, MD;  Location: [facility] OR;  Service: Vascular;  Laterality: Right;   Family History  Family History  Problem Relation Age of Onset   Stroke Mother    Seizures Mother    Migraines Mother    Diabetes Brother    Colon cancer Neg Hx    Esophageal cancer Neg Hx    Stomach cancer Neg Hx    Rectal cancer Neg Hx    Social History  reports that she has quit smoking. Her smoking use included cigarettes. She has never been exposed to tobacco smoke. She has never used smokeless tobacco. She reports that she does not drink alcohol and does not use drugs. Allergies No Known Allergies Home medications Prior to Admission medications   Medication Sig Start Date End Date Taking? Authorizing Provider  amoxicillin (AMOXIL) 250 MG capsule Take 250 mg by mouth daily. 07/10/23  Yes [provider]  aspirin EC 81 MG tablet Take 81 mg by mouth daily.    [provider]  calcitRIOL (ROCALTROL) 0.25 MCG capsule Take 0.25 mcg by mouth every Monday, Wednesday, and Friday.    [provider]  clopidogrel (PLAVIX) 75 MG tablet Take 1 tablet (75 mg total) by mouth daily. 04/25/23 04/24/24  Cephus Shelling, MD  Doxercalciferol (HECTOROL IV) Doxercalciferol (Hectorol) 02/16/23 02/15/24  [provider]  hydrALAZINE (APRESOLINE) 100 MG tablet Take 100 mg by mouth  daily as needed (for systolic blood pressure of 157 and above).    [provider]  magnesium oxide (MAG-OX) 400 MG tablet Take 400 mg by mouth 2 (two) times daily.     [provider]  omeprazole (PRILOSEC) 20 MG capsule Take 20 mg by mouth daily.    [provider]  predniSONE (DELTASONE) 2.5 MG tablet Take 2.5 mg by mouth daily.    [provider]  sevelamer carbonate (RENVELA) 800 MG tablet Take 1,600 mg by mouth 3 (three) times daily with meals. Take 800 mg tablet with snacks 02/21/22   [provider]  simvastatin (ZOCOR) 20 MG tablet Take 20 mg by mouth every evening.    [provider]  VENTOLIN HFA 108 (90 Base) MCG/ACT inhaler Inhale 1 puff into the lungs 3 times/day as needed-between meals & bedtime. 06/21/21   Albertus Alt, MD     Vitals:   07/15/23 1430 07/15/23 1435 07/15/23 1500 07/15/23 1530  BP: (!) 176/74  (!) 170/75 (!) 168/74  Pulse: 87  92 88  Resp: (!) 24  16 20   Temp:  97.7 F (36.5 C)    TempSrc:  Oral    SpO2: 98%  99% 98%  Weight:      Height:       Exam Gen alert, no distress, 2-3 L [place] No rash, cyanosis or gangrene Sclera anicteric, throat clear  +JVD, no bruits Chest bilat basilar soft crackles RRR no MRG Abd soft ntnd no mass or ascites +bs GU defer MS no joint effusions or deformity Ext no LE or UE edema, no other edema Neuro is alert, Ox 3 , nf    RUA AVF+bruit       Renal-related home meds: Rocaltrol 0.25 mcg mwf Hydralazine 100 tid Renvela 800 ac tid Others: ventolin, zocor, prednisone, prilosec, plavix, asa    OP HD: [facility] TTS Dec 2024 --> 4h  B400   55.2kg   2K bath  AVF  Heparin 2400    Assessment/ Plan: SOB: w/ pulm edema on CXR, orthopnea, rales on exam. Pt has poor appetite and recently has had the dry wt lowered. Suspect she continues to lose body wt causing volume overload in esrd pt. Needs HD tonight , max UF.  ESRD: on HD TTS. Last HD Sat, has not missed. HD as above.  HTN: vol overload causing high BP's, get vol down, cont home meds Volume: as above.  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  Secondary hyperparathyroidism: CCa in range, add on phos.       Larry Poag  MD [facility] 07/15/2023, 4:11 PM  Recent Labs  Lab 07/15/23 1133 07/15/23 1147  HGB 9.8* 10.2*  ALBUMIN 3.4*  --   CALCIUM 9.1  --   CREATININE 8.04*  --   K 3.4* 3.5   Inpatient medications:  sodium chloride flush  3 mL Intravenous Q12H    sodium chloride flush

## 2023-07-16 DIAGNOSIS — E8770 Fluid overload, unspecified: Secondary | ICD-10-CM | POA: Diagnosis not present

## 2023-07-16 LAB — CBC
HCT: 28.5 % — ABNORMAL LOW (ref 36.0–46.0)
Hemoglobin: 9.2 g/dL — ABNORMAL LOW (ref 12.0–15.0)
MCH: 30.7 pg (ref 26.0–34.0)
MCHC: 32.3 g/dL (ref 30.0–36.0)
MCV: 95 fL (ref 80.0–100.0)
Platelets: 236 10*3/uL (ref 150–400)
RBC: 3 MIL/uL — ABNORMAL LOW (ref 3.87–5.11)
RDW: 14.4 % (ref 11.5–15.5)
WBC: 6.3 10*3/uL (ref 4.0–10.5)
nRBC: 0 % (ref 0.0–0.2)

## 2023-07-16 LAB — RENAL FUNCTION PANEL
Albumin: 3 g/dL — ABNORMAL LOW (ref 3.5–5.0)
Anion gap: 13 (ref 5–15)
BUN: <5 mg/dL — ABNORMAL LOW (ref 8–23)
CO2: 26 mmol/L (ref 22–32)
Calcium: 8.6 mg/dL — ABNORMAL LOW (ref 8.9–10.3)
Chloride: 95 mmol/L — ABNORMAL LOW (ref 98–111)
Creatinine, Ser: 4.2 mg/dL — ABNORMAL HIGH (ref 0.44–1.00)
GFR, Estimated: 11 mL/min — ABNORMAL LOW (ref >60)
Glucose, Bld: 69 mg/dL — ABNORMAL LOW (ref 70–99)
Phosphorus: 1.8 mg/dL — ABNORMAL LOW (ref 2.5–4.6)
Potassium: 3.4 mmol/L — ABNORMAL LOW (ref 3.5–5.1)
Sodium: 134 mmol/L — ABNORMAL LOW (ref 135–145)

## 2023-07-16 LAB — MRSA NEXT GEN BY PCR, NASAL: MRSA by PCR Next Gen: NOT DETECTED

## 2023-07-16 LAB — HEPATITIS B SURFACE ANTIBODY, QUANTITATIVE: Hep B S AB Quant (Post): 1141 m[IU]/mL

## 2023-07-16 MED ORDER — ISOSORBIDE DINITRATE 5 MG PO TABS
5.0000 mg | ORAL_TABLET | Freq: Two times a day (BID) | ORAL | Status: DC
  Administered 2023-07-16 – 2023-07-18 (×3): 5 mg via ORAL
  Filled 2023-07-16 (×5): qty 1

## 2023-07-16 NOTE — Evaluation (Signed)
 Physical Therapy Evaluation Patient Details Name: Karen [name] MRN: [id number] DOB: [birth year] Today's Date: 07/16/2023  History of Present Illness  69 year old patient with a history of ESRD on HD TTS, renal transplant 2017, HLD, HTN, seizure disorder, and PVD who presented to the ER 07/15/23 with gradually progressive shortness of breath over the past 2 to 3 days  Clinical Impression  Patient received in recliner, brother at bedside. She is agreeable to PT assessment. Patient received on 2 liters O2. Not on home O2. She is at 96% at rest. Patient ambulated ~75 feet with hand held assist on room air with sats dropping to 88% after walk. She was returned to supplemental O2 once returned to room. Patient will continue to benefit from skilled PT to improve strength and endurance.         If plan is discharge home, recommend the following: Assist for transportation   Can travel by private vehicle    yes    Equipment Recommendations None recommended by PT  Recommendations for Other Services    Declines hhpt   Functional Status Assessment Patient has had a recent decline in their functional status and demonstrates the ability to make significant improvements in function in a reasonable and predictable amount of time.     Precautions / Restrictions Precautions Precautions: Fall Precaution/Restrictions Comments: low fall Restrictions Weight Bearing Restrictions Per Provider Order: No      Mobility  Bed Mobility               General bed mobility comments: NT patient in recliner and remained in recliner    Transfers Overall transfer level: Needs assistance Equipment used: None Transfers: Sit to/from Stand Sit to Stand: Contact guard assist                Ambulation/Gait Ambulation/Gait assistance: Contact guard assist Gait Distance (Feet): 75 Feet Assistive device: 1 person hand held assist Gait Pattern/deviations: Step-through pattern, Decreased step length -  right, Decreased step length - left, Decreased stride length Gait velocity: decr     General Gait Details: slightly unsteady with ambulation. Ambualted on room air with sats dropping to 88%. Returned patient to 2 liters at end of session with sats at 93%  Stairs            Wheelchair Mobility     Tilt Bed    Modified Rankin (Stroke Patients Only)       Balance Overall balance assessment: Needs assistance Sitting-balance support: Feet supported Sitting balance-Leahy Scale: Good     Standing balance support: Single extremity supported, During functional activity Standing balance-Leahy Scale: Fair Standing balance comment: slight unsteadiness with mobility, cga                             Pertinent Vitals/Pain Pain Assessment Pain Assessment: No/denies pain    Home Living Family/patient expects to be discharged to:: Private residence Living Arrangements: Alone Available Help at Discharge: Family;Available PRN/intermittently Type of Home: House Home Access: Level entry       Home Layout: One level Home Equipment: Cane - single point      Prior Function Prior Level of Function : Independent/Modified Independent;Driving             Mobility Comments: independent ADLs Comments: independent     Extremity/Trunk Assessment   Upper Extremity Assessment Upper Extremity Assessment: Generalized weakness    Lower Extremity Assessment Lower Extremity Assessment: Generalized weakness    Cervical /  Trunk Assessment Cervical / Trunk Assessment: Normal  Communication   Communication Communication: No apparent difficulties    Cognition Arousal: Alert Behavior During Therapy: WFL for tasks assessed/performed                             Following commands: Intact       Cueing Cueing Techniques: Verbal cues     General Comments      Exercises     Assessment/Plan    PT Assessment Patient needs continued PT services  PT  Problem List Decreased strength;Decreased balance;Decreased mobility;Decreased activity tolerance       PT Treatment Interventions Gait training;Functional mobility training;Therapeutic activities;Therapeutic exercise;Balance training;Patient/family education    PT Goals (Current goals can be found in the Care Plan section)  Acute Rehab PT Goals Patient Stated Goal: to improve and go home PT Goal Formulation: With patient Time For Goal Achievement: 07/20/23 Potential to Achieve Goals: Good    Frequency Min 2X/week     Co-evaluation               AM-PAC PT "6 Clicks" Mobility  Outcome Measure Help needed turning from your back to your side while in a flat bed without using bedrails?: None Help needed moving from lying on your back to sitting on the side of a flat bed without using bedrails?: None Help needed moving to and from a bed to a chair (including a wheelchair)?: A Little Help needed standing up from a chair using your arms (e.g., wheelchair or bedside chair)?: A Little Help needed to walk in hospital room?: A Little Help needed climbing 3-5 steps with a railing? : A Little 6 Click Score: 20    End of Session   Activity Tolerance: Patient limited by fatigue Patient left: in chair;with call bell/phone within reach;with family/visitor present Nurse Communication: Mobility status PT Visit Diagnosis: Other abnormalities of gait and mobility (R26.89);Unsteadiness on feet (R26.81);Muscle weakness (generalized) (M62.81)    Time: 1308-6578 PT Time Calculation (min) (ACUTE ONLY): 10 min   Charges:   PT Evaluation $PT Eval Low Complexity: 1 Low   PT General Charges $$ ACUTE PT VISIT: 1 Visit         Tayen Narang, PT, GCS 07/16/23,1:28 PM

## 2023-07-16 NOTE — Progress Notes (Signed)
 Karen [name]  BJY:[id number] DOB: [birth year] DOA: 07/15/2023 PCP: Bufford Buttner, MD    Brief Narrative:  68 year old patient with a history of ESRD on HD TTS, renal transplant 2017, HLD, HTN, seizure disorder, and PVD who presented to the ER with gradually progressive shortness of breath over the past 2 to 3 days.  She had not missed any dialysis and had full sessions each visit.  She denied dietary noncompliance.  On arrival she was not markedly hypoxic but she was clearly dyspneic with tachypnea up to 33 breaths/min.  Lung exam revealed bilateral crackles.  Goals of Care:   Code Status: Full Code   DVT prophylaxis: heparin injection 5,000 Units Start: 07/15/23 1630   Interim Hx: No acute events recorded overnight.  Underwent dialysis earlier this morning.  Afebrile.  Blood pressure elevated with systolics 144-163.  Vital signs otherwise stable.  The patient is feeling better overall, but not quite back to her baseline as far as dyspnea.  Assessment & Plan:  Volume overload Management per dialysis - Nephrology following - felt to be simple volume overload related to need to adjust her EDW target -ongoing dialysis per nephrology  Consolidative mass right lower lobe Noted incidentally on CTa chest which was obtained to rule out PE -likely atelectasis related to right pleural effusion but radiology reports that mass cannot be entirely excluded -repeat imaging as outpatient indicated -no clinical evidence to suggest an acute infection/pneumonia   ESRD on HD TTS Nephrology following and will attend to dialysis   HTN - uncontrolled likely a consequence of volume overload - adjust medical therapy today and follow trend   HLD Continue usual home medical therapy   Seizure disorder Continue usual home medications   Status post renal transplant 2017 with subsequent failure Is on chronic prednisone   Family Communication: Spoke with patient and family at bedside Disposition:  Anticipate discharge home, hopefully 4/16 depending upon performance with PT/OT and whether or not nephrology feels she needs more dialysis   Objective: Blood pressure (!) 163/65, pulse 88, temperature 98 F (36.7 C), temperature source Oral, resp. rate 16, height 5' (1.524 m), weight 62.8 kg, SpO2 97%.  Intake/Output Summary (Last 24 hours) at 07/16/2023 1029 Last data filed at 07/16/2023 0030 Gross per 24 hour  Intake --  Output 3000 ml  Net -3000 ml   Filed Weights   07/15/23 1045  Weight: 62.8 kg    Examination: General: No acute respiratory distress Lungs: Clear to auscultation bilaterally -no wheezing Cardiovascular: Regular rate and rhythm without murmur gallop or rub normal S1 and S2 Abdomen: Nontender, nondistended, soft, bowel sounds positive, no rebound, no ascites, no appreciable mass Extremities: No significant cyanosis, clubbing, or edema bilateral lower extremities  CBC: Recent Labs  Lab 07/15/23 1133 07/15/23 1147 07/16/23 0445  WBC 6.9  --  6.3  NEUTROABS 4.3  --   --   HGB 9.8* 10.2* 9.2*  HCT 30.5* 30.0* 28.5*  MCV 96.2  --  95.0  PLT 252  --  236   Basic Metabolic Panel: Recent Labs  Lab 07/15/23 1133 07/15/23 1147 07/16/23 0445  NA 137 137 134*  K 3.4* 3.5 3.4*  CL 95*  --  95*  CO2 27  --  26  GLUCOSE 89  --  69*  BUN 15  --  <5*  CREATININE 8.04*  --  4.20*  CALCIUM 9.1  --  8.6*  PHOS  --   --  1.8*   GFR: Estimated Creatinine Clearance:  10.9 mL/min (A) (by C-G formula based on SCr of 4.2 mg/dL (H)).   Scheduled Meds:  aspirin EC  81 mg Oral Daily   calcitRIOL  0.25 mcg Oral Q M,W,F   Chlorhexidine Gluconate Cloth  6 each Topical Q0600   clopidogrel  75 mg Oral Daily   heparin  5,000 Units Subcutaneous Q8H   hydrALAZINE  100 mg Oral TID   magnesium oxide  400 mg Oral BID   pantoprazole  40 mg Oral Daily   predniSONE  2.5 mg Oral QAC breakfast   senna  1 tablet Oral BID   simvastatin  20 mg Oral QPM   sodium chloride flush  3  mL Intravenous Q12H   Continuous Infusions:  sodium chloride       LOS: 1 day   Abbe Abate, MD [facility] Office  [phone] Pager - Text Page per Tilford Foley  If 7PM-7AM, please contact night-coverage per Amion 07/16/2023, 10:29 AM

## 2023-07-16 NOTE — TOC CM/SW Note (Addendum)
 Transition of Care [facility]) - Inpatient Brief Assessment   Patient Details  Name: Karen [name] MRN: [id number] Date of Birth: 09-Oct-1955  Transition of Care [facility]) CM/SW Contact:    Tom-Johnson, Angelique Ken, RN Phone Number: 07/16/2023, 4:04 PM   Clinical Narrative:  Patient presented to the ED with worsening Shortness of Breath. Patient has Hx of ESRD and on TTS Outpatient HD. States she has not missed any Dialysis and has had full sessions each visit. Admitted with Volume Overload, Nephrology following for inpatient HD.   From home alone, has one daughter and three supportive siblings. Independent with care and drive self to and from outpatient HD. Has a cane at home.  PCP is Leandra Pro, MD and uses [facility] on [address]   No PT f/u, no TOC needs or recommendations noted at this time.  Patient not Medically ready for discharge.  CM will continue to follow as patient progresses with care towards discharge.                Transition of Care Asessment: Insurance and Status: Insurance coverage has been reviewed Patient has primary care physician: Yes Home environment has been reviewed: Yes Prior level of function:: Independent Prior/Current Home Services: No current home services Social Drivers of Health Review: SDOH reviewed no interventions necessary Readmission risk has been reviewed: Yes Transition of care needs: no transition of care needs at this time

## 2023-07-16 NOTE — Progress Notes (Signed)
   07/16/23 0030  Vitals  Temp 98.2 F (36.8 C)  Temp Source Oral  BP (!) 152/66  MAP (mmHg) 93  BP Location Left Arm  BP Method Automatic  Patient Position (if appropriate) Lying  Pulse Rate 84  Pulse Rate Source Monitor  ECG Heart Rate 85  Resp (!) 21  Oxygen Therapy  SpO2 100 %  O2 Device Nasal Cannula  O2 Flow Rate (L/min) 2 L/min  During Treatment Monitoring  Blood Flow Rate (mL/min) 0 mL/min  Arterial Pressure (mmHg) 16.16 mmHg  Venous Pressure (mmHg) -12.12 mmHg  TMP (mmHg) 16.77 mmHg  Ultrafiltration Rate (mL/min) 948 mL/min  Dialysate Flow Rate (mL/min) 300 ml/min  Dialysate Potassium Concentration 3  Dialysate Calcium Concentration 2.5  Duration of HD Treatment -hour(s) 3.5 hour(s)  Cumulative Fluid Removed (mL) per Treatment  3000.24  HD Safety Checks Performed Yes  Intra-Hemodialysis Comments Tx completed  Post Treatment  Dialyzer Clearance Clear  Liters Processed 73.5  Fluid Removed (mL) 3000 mL  Tolerated HD Treatment Yes  AVG/AVF Arterial Site Held (minutes) 10 minutes  AVG/AVF Venous Site Held (minutes) 10 minutes   Rt fistula needles removed without complications. Pt stable. Pt breathing normal with 2lpm oxygen

## 2023-07-16 NOTE — Progress Notes (Addendum)
 [facility] Progress Note   Subjective: Up in chair. Feels better after HD last night but tired. Finished HD around 0100. Net UF  3.0 Liters.     Objective Vitals:   07/16/23 0030 07/16/23 0139 07/16/23 0531 07/16/23 0817  BP: (!) 152/66 (!) 148/66 (!) 144/76 (!) 163/65  Pulse: 84 82 81 88  Resp: (!) 21 13 16    Temp: 98.2 F (36.8 C) 98.2 F (36.8 C) 98.7 F (37.1 C) 98 F (36.7 C)  TempSrc: Oral Oral Oral Oral  SpO2: 100% 96% 96% 97%  Weight:      Height:       Physical Exam General: Pleasant older female in NAD Heart: S1,S2 RRR no M/R/G Lungs: CTAB A/P Abdomen: NABS, NT Extremities: No LE edema Dialysis Access: R AVF + T/B   Additional Objective Labs: Basic Metabolic Panel: Recent Labs  Lab 07/15/23 1133 07/15/23 1147 07/16/23 0445  NA 137 137 134*  K 3.4* 3.5 3.4*  CL 95*  --  95*  CO2 27  --  26  GLUCOSE 89  --  69*  BUN 15  --  <5*  CREATININE 8.04*  --  4.20*  CALCIUM 9.1  --  8.6*  PHOS  --   --  1.8*   Liver Function Tests: Recent Labs  Lab 07/15/23 1133 07/16/23 0445  AST 22  --   ALT 8  --   ALKPHOS 75  --   BILITOT 1.5*  --   PROT 8.0  --   ALBUMIN 3.4* 3.0*   No results for input(s): "LIPASE", "AMYLASE" in the last 168 hours. CBC: Recent Labs  Lab 07/15/23 1133 07/15/23 1147 07/16/23 0445  WBC 6.9  --  6.3  NEUTROABS 4.3  --   --   HGB 9.8* 10.2* 9.2*  HCT 30.5* 30.0* 28.5*  MCV 96.2  --  95.0  PLT 252  --  236   Blood Culture    Component Value Date/Time   SDES BLOOD SITE NOT SPECIFIED 02/28/2022 0310   SPECREQUEST  02/28/2022 0310    BOTTLES DRAWN AEROBIC AND ANAEROBIC Blood Culture adequate volume   CULT  02/28/2022 0310    NO GROWTH 5 DAYS Performed at [facility], [address] [place], [place] [address]    REPTSTATUS 03/05/2022 FINAL 02/28/2022 0310    Cardiac Enzymes: No results for input(s): "CKTOTAL", "CKMB", "CKMBINDEX", "TROPONINI" in the last 168 hours. CBG: No results for  input(s): "GLUCAP" in the last 168 hours. Iron Studies: No results for input(s): "IRON", "TIBC", "TRANSFERRIN", "FERRITIN" in the last 72 hours. @lablastinr3 @ Studies/Results: CT Angio Chest PE W and/or Wo Contrast Result Date: 07/15/2023 CLINICAL DATA:  Pulmonary embolism (PE) suspected, low to intermediate prob, positive D-dimer EXAM: CT ANGIOGRAPHY CHEST WITH CONTRAST TECHNIQUE: Multidetector CT imaging of the chest was performed using the standard protocol during bolus administration of intravenous contrast. Multiplanar CT image reconstructions and MIPs were obtained to evaluate the vascular anatomy. RADIATION DOSE REDUCTION: This exam was performed according to the departmental dose-optimization program which includes automated exposure control, adjustment of the mA and/or kV according to patient size and/or use of iterative reconstruction technique. CONTRAST:  75mL OMNIPAQUE IOHEXOL 350 MG/ML SOLN COMPARISON:  Chest radiographs dated 07/15/2023 and 02/27/2022. FINDINGS: Cardiovascular: Satisfactory opacification of the pulmonary arteries to the segmental level. No evidence of pulmonary embolism. Heart is enlarged. Mitral annular calcification. Thoracic aorta is normal in caliber with atherosclerotic calcification. Mediastinum/Nodes: Prominent mediastinal and hilar lymph nodes, with  a right hilar lymph node measuring up to 10 mm in short axis (series 5, image 64). No enlarged axillary lymph nodes. Thyroid gland, trachea, and esophagus demonstrate no significant abnormality. Lungs/Pleura: Moderate right and small left pleural effusions with associated bibasilar atelectasis. Consolidative opacities at the right lung base. Bilateral interlobular septal thickening. No pneumothorax. Upper Abdomen: No acute abnormality. Musculoskeletal: Severe degenerative changes of the right glenohumeral joint. Degenerative changes of the thoracic spine. No suspicious osseous lesion. Review of the MIP images confirms the above  findings. IMPRESSION: 1. No evidence of pulmonary embolism. 2. Moderate right and small left pleural effusions with associated bibasilar atelectasis. 3. Consolidative opacity at the right lung base could represent atelectasis or infiltrate, however, underlying mass can not be entirely excluded. 4. Cardiomegaly with findings compatible with pulmonary edema. 5. Prominent nonspecific mediastinal and hilar lymph nodes may be reactive. Aortic Atherosclerosis (ICD10-I70.0). Electronically Signed   By: Mannie Seek M.D.   On: 07/15/2023 18:40   DG Chest Port 1 View Result Date: 07/15/2023 CLINICAL DATA:  Shortness of breath EXAM: PORTABLE CHEST 1 VIEW COMPARISON:  X-ray 02/27/2022 and older FINDINGS: Persistent moderate right effusion, slightly decreased today from previous. Small left effusion is new. Increasing lung base opacities with some interstitial components. Enlarged cardiopericardial silhouette. Question component of edema. No pneumothorax. Overlapping cardiac leads. Prominent calcifications along the tracheobronchial tree. IMPRESSION: Moderate right effusion, decreased from previous however. New small left effusion. Increasing lung base opacities and interstitial changes. Enlarged heart. Electronically Signed   By: Adrianna Horde M.D.   On: 07/15/2023 14:05   Medications:   aspirin EC  81 mg Oral Daily   calcitRIOL  0.25 mcg Oral Q M,W,F   Chlorhexidine Gluconate Cloth  6 each Topical Q0600   clopidogrel  75 mg Oral Daily   heparin  5,000 Units Subcutaneous Q8H   hydrALAZINE  100 mg Oral TID   magnesium oxide  400 mg Oral BID   pantoprazole  40 mg Oral Daily   predniSONE  2.5 mg Oral QAC breakfast   senna  1 tablet Oral BID   simvastatin  20 mg Oral QPM      OP HD: [place] 3.5 hours 180NRe 400/600 62.5 kg 3.0 K/2.0 Ca AVF - Heparin 2400 units IV three times per week - Hectorol 5 mcg IV three times per week - Mircera 50 mcg IV q 2 weeks (last dose 07/06/2023-due 07/20/2023)    Assessment/ Plan: SOB: w/ pulm edema on CXR, orthopnea, rales on exam. Pt has poor appetite and recently has had the dry wt lowered. Suspect she continues to lose body wt causing volume overload in esrd pt. Needs HD 07/15/2023 Net UF 3.5 liters ESRD: on HD TTS. HD off schedule 07/15/2023. Continue MWF for now.  HTN/Volume: BP reasonably controlled after volume removal yesterday. Continue current meds-on hydralazine without BB. UF as tolerated. Optimize volume with dialysis.   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 ESA due 07/20/2023. Will order.   Secondary hyperparathyroidism: CCa in range, add on phos.     Paelyn Smick H. Yeiden Frenkel NP-C 07/16/2023, 11:59 AM  [employer] [phone]

## 2023-07-17 DIAGNOSIS — N289 Disorder of kidney and ureter, unspecified: Secondary | ICD-10-CM | POA: Diagnosis not present

## 2023-07-17 DIAGNOSIS — E8770 Fluid overload, unspecified: Secondary | ICD-10-CM | POA: Diagnosis not present

## 2023-07-17 LAB — CBC
HCT: 28.9 % — ABNORMAL LOW (ref 36.0–46.0)
Hemoglobin: 9.5 g/dL — ABNORMAL LOW (ref 12.0–15.0)
MCH: 30.7 pg (ref 26.0–34.0)
MCHC: 32.9 g/dL (ref 30.0–36.0)
MCV: 93.5 fL (ref 80.0–100.0)
Platelets: 234 10*3/uL (ref 150–400)
RBC: 3.09 MIL/uL — ABNORMAL LOW (ref 3.87–5.11)
RDW: 14.1 % (ref 11.5–15.5)
WBC: 6.2 10*3/uL (ref 4.0–10.5)
nRBC: 0 % (ref 0.0–0.2)

## 2023-07-17 LAB — RENAL FUNCTION PANEL
Albumin: 2.9 g/dL — ABNORMAL LOW (ref 3.5–5.0)
Anion gap: 12 (ref 5–15)
BUN: 15 mg/dL (ref 8–23)
CO2: 28 mmol/L (ref 22–32)
Calcium: 8.9 mg/dL (ref 8.9–10.3)
Chloride: 93 mmol/L — ABNORMAL LOW (ref 98–111)
Creatinine, Ser: 7.46 mg/dL — ABNORMAL HIGH (ref 0.44–1.00)
GFR, Estimated: 6 mL/min — ABNORMAL LOW (ref >60)
Glucose, Bld: 90 mg/dL (ref 70–99)
Phosphorus: 1.9 mg/dL — ABNORMAL LOW (ref 2.5–4.6)
Potassium: 3.6 mmol/L (ref 3.5–5.1)
Sodium: 133 mmol/L — ABNORMAL LOW (ref 135–145)

## 2023-07-17 MED ORDER — LIDOCAINE HCL (PF) 1 % IJ SOLN: 5.0000 mL | INTRAMUSCULAR | Status: DC | PRN

## 2023-07-17 MED ORDER — NEPRO/CARBSTEADY PO LIQD: 237.0000 mL | ORAL | Status: DC | PRN

## 2023-07-17 MED ORDER — HEPARIN SODIUM (PORCINE) 1000 UNIT/ML DIALYSIS
2400.0000 [IU] | Freq: Once | INTRAMUSCULAR | Status: AC
Start: 2023-07-17 — End: 2023-07-17
  Administered 2023-07-17: 2400 [IU] via INTRAVENOUS_CENTRAL
  Filled 2023-07-17: qty 3

## 2023-07-17 MED ORDER — PENTAFLUOROPROP-TETRAFLUOROETH EX AERO: 1.0000 | INHALATION_SPRAY | CUTANEOUS | Status: DC | PRN

## 2023-07-17 MED ORDER — LIDOCAINE-PRILOCAINE 2.5-2.5 % EX CREA: 1.0000 | TOPICAL_CREAM | CUTANEOUS | Status: DC | PRN

## 2023-07-17 NOTE — Progress Notes (Signed)
 Pt receives out-pt HD at [facility] on TTS 5:25 am chair time. Will assist as needed.    Lauraine Polite Renal Navigator [phone]

## 2023-07-17 NOTE — Progress Notes (Signed)
 [facility] Progress Note   Subjective: Seen in room. No C/Os. Denies SOB. Will have HD off schedule 1st shift in AM.      Objective Vitals:   07/16/23 1617 07/16/23 2105 07/17/23 0530 07/17/23 0943  BP: (!) 159/62 (!) 158/66 (!) 147/93 (!) 146/67  Pulse: 78 86 75 87  Resp:  18 18 18   Temp: 98.1 F (36.7 C) 98.1 F (36.7 C) 98.2 F (36.8 C) 98.3 F (36.8 C)  TempSrc: Oral Oral  Oral  SpO2: 100% 100% 100% 99%  Weight:      Height:       Physical Exam General: Pleasant older female in NAD Heart: S1,S2 RRR no M/R/G Lungs: CTAB A/P Abdomen: NABS, NT Extremities: No LE edema Dialysis Access: R AVF + T/B   Additional Objective Labs: Basic Metabolic Panel: Recent Labs  Lab 07/15/23 1133 07/15/23 1147 07/16/23 0445 07/17/23 1202  NA 137 137 134* 133*  K 3.4* 3.5 3.4* 3.6  CL 95*  --  95* 93*  CO2 27  --  26 28  GLUCOSE 89  --  69* 90  BUN 15  --  <5* 15  CREATININE 8.04*  --  4.20* 7.46*  CALCIUM 9.1  --  8.6* 8.9  PHOS  --   --  1.8* 1.9*   Liver Function Tests: Recent Labs  Lab 07/15/23 1133 07/16/23 0445 07/17/23 1202  AST 22  --   --   ALT 8  --   --   ALKPHOS 75  --   --   BILITOT 1.5*  --   --   PROT 8.0  --   --   ALBUMIN 3.4* 3.0* 2.9*   No results for input(s): "LIPASE", "AMYLASE" in the last 168 hours. CBC: Recent Labs  Lab 07/15/23 1133 07/15/23 1147 07/16/23 0445 07/17/23 1202  WBC 6.9  --  6.3 6.2  NEUTROABS 4.3  --   --   --   HGB 9.8* 10.2* 9.2* 9.5*  HCT 30.5* 30.0* 28.5* 28.9*  MCV 96.2  --  95.0 93.5  PLT 252  --  236 234   Blood Culture    Component Value Date/Time   SDES BLOOD SITE NOT SPECIFIED 02/28/2022 0310   SPECREQUEST  02/28/2022 0310    BOTTLES DRAWN AEROBIC AND ANAEROBIC Blood Culture adequate volume   CULT  02/28/2022 0310    NO GROWTH 5 DAYS Performed at [facility], [address], [place], [place] [address]    REPTSTATUS 03/05/2022 FINAL 02/28/2022 0310    Cardiac Enzymes: No  results for input(s): "CKTOTAL", "CKMB", "CKMBINDEX", "TROPONINI" in the last 168 hours. CBG: No results for input(s): "GLUCAP" in the last 168 hours. Iron Studies: No results for input(s): "IRON", "TIBC", "TRANSFERRIN", "FERRITIN" in the last 72 hours. @lablastinr3 @ Studies/Results: CT Angio Chest PE W and/or Wo Contrast Result Date: 07/15/2023 CLINICAL DATA:  Pulmonary embolism (PE) suspected, low to intermediate prob, positive D-dimer EXAM: CT ANGIOGRAPHY CHEST WITH CONTRAST TECHNIQUE: Multidetector CT imaging of the chest was performed using the standard protocol during bolus administration of intravenous contrast. Multiplanar CT image reconstructions and MIPs were obtained to evaluate the vascular anatomy. RADIATION DOSE REDUCTION: This exam was performed according to the departmental dose-optimization program which includes automated exposure control, adjustment of the mA and/or kV according to patient size and/or use of iterative reconstruction technique. CONTRAST:  75mL OMNIPAQUE IOHEXOL 350 MG/ML SOLN COMPARISON:  Chest radiographs dated 07/15/2023 and 02/27/2022. FINDINGS: Cardiovascular: Satisfactory opacification of  the pulmonary arteries to the segmental level. No evidence of pulmonary embolism. Heart is enlarged. Mitral annular calcification. Thoracic aorta is normal in caliber with atherosclerotic calcification. Mediastinum/Nodes: Prominent mediastinal and hilar lymph nodes, with a right hilar lymph node measuring up to 10 mm in short axis (series 5, image 64). No enlarged axillary lymph nodes. Thyroid gland, trachea, and esophagus demonstrate no significant abnormality. Lungs/Pleura: Moderate right and small left pleural effusions with associated bibasilar atelectasis. Consolidative opacities at the right lung base. Bilateral interlobular septal thickening. No pneumothorax. Upper Abdomen: No acute abnormality. Musculoskeletal: Severe degenerative changes of the right glenohumeral joint.  Degenerative changes of the thoracic spine. No suspicious osseous lesion. Review of the MIP images confirms the above findings. IMPRESSION: 1. No evidence of pulmonary embolism. 2. Moderate right and small left pleural effusions with associated bibasilar atelectasis. 3. Consolidative opacity at the right lung base could represent atelectasis or infiltrate, however, underlying mass can not be entirely excluded. 4. Cardiomegaly with findings compatible with pulmonary edema. 5. Prominent nonspecific mediastinal and hilar lymph nodes may be reactive. Aortic Atherosclerosis (ICD10-I70.0). Electronically Signed   By: Mannie Seek M.D.   On: 07/15/2023 18:40   Medications:   aspirin EC  81 mg Oral Daily   calcitRIOL  0.25 mcg Oral Q M,W,F   Chlorhexidine Gluconate Cloth  6 each Topical Q0600   clopidogrel  75 mg Oral Daily   heparin  2,400 Units Dialysis Once in dialysis   heparin  5,000 Units Subcutaneous Q8H   hydrALAZINE  100 mg Oral TID   isosorbide dinitrate  5 mg Oral BID   magnesium oxide  400 mg Oral BID   pantoprazole  40 mg Oral Daily   predniSONE  2.5 mg Oral QAC breakfast   senna  1 tablet Oral BID   simvastatin  20 mg Oral QPM     OP HD: [place] TTS 3.5 hours 180NRe 400/600 62.5 kg 3.0 K/2.0 Ca AVF - Heparin 2400 units IV three times per week - Hectorol 5 mcg IV three times per week - Mircera 50 mcg IV q 2 weeks (last dose 07/06/2023-due 07/20/2023)   Assessment/ Plan: SOB: w/ pulm edema on CXR, orthopnea, rales on exam. Pt has poor appetite and recently has had the dry wt lowered. Suspect she continues to lose body wt causing volume overload in esrd pt. Needs HD 07/15/2023 Net UF 3.5 liters ESRD: on HD TTS. HD off schedule 07/15/2023 finished HD early morning 07/16/2023. HD tomorrow on schedule.  HTN/Volume: BP reasonably controlled after volume removal yesterday. Continue current meds-on hydralazine without BB. UF as tolerated. Optimize volume with dialysis.   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 ESA due 07/20/2023. Will order.   Secondary hyperparathyroidism: CCa in range, PO4 very low. Hold binders. K+ and PO4 both low. Liberalize diet. Zulma Hitt. Jamayah Myszka NP-C 07/17/2023, 2:17 PM  [employer] [phone]

## 2023-07-17 NOTE — Hospital Course (Signed)
 68 year old patient with a history of ESRD on HD TTS, renal transplant 2017, HLD, HTN, seizure disorder, and PVD who presented to the ER with gradually progressive shortness of breath over the past 2 to 3 days. She had not missed any dialysis and had full sessions each visit. She denied dietary noncompliance. On arrival she was not markedly hypoxic but she was clearly dyspneic with tachypnea up to 33 breaths/min.

## 2023-07-17 NOTE — Plan of Care (Signed)

## 2023-07-17 NOTE — Evaluation (Signed)
 Occupational Therapy Evaluation Patient Details Name: Karen [name] MRN: [id number] DOB: 02/21/1956 Today's Date: 07/17/2023   History of Present Illness   68 year old patient with a history of ESRD on HD TTS, renal transplant 2017, HLD, HTN, seizure disorder, and PVD who presented to the ER 07/15/23 with gradually progressive shortness of breath over the past 2 to 3 days     Clinical Impressions Pt resting in recliner, feeling much better than at admission, no complaints. Pt lives alone, has several family/friends who can assist at home if needed throughout each day, PLOF independent, drives to HD, uses cane only after HD days, reports no falls. Pt at this time close to baseline, mod I with ADLs, increased time, able to ambulate around room unassisted but due to frequent LOB due to stiffness/weakness in BLEs Pt has poor balance recovery requiring OT assistance or wall to grab for support. Pt says as long as she has something to grab when she loses her balance she will be okay. Pt educated on use of RW and keeping it close at all times, positioning when performing ADLs and transfers, hand placement. Pt stated RW made her feel more stable and safe, recommending RW for return home. At this time Pt has no acute OT needs, no OT follow up needs, would benefit from OP therapy to address balance and BLE strength to maximize safety and balance.      If plan is discharge home, recommend the following:   A little help with walking and/or transfers;Assistance with cooking/housework     Functional Status Assessment   Patient has had a recent decline in their functional status and demonstrates the ability to make significant improvements in function in a reasonable and predictable amount of time.     Equipment Recommendations   Other (comment) (RW)     Recommendations for Other Services         Precautions/Restrictions   Precautions Precautions: Fall Recall of  Precautions/Restrictions: Intact Restrictions Weight Bearing Restrictions Per Provider Order: No     Mobility Bed Mobility               General bed mobility comments: in recliner    Transfers Overall transfer level: Needs assistance Equipment used: Rolling walker (2 wheels) Transfers: Sit to/from Stand, Bed to chair/wheelchair/BSC Sit to Stand: Contact guard assist     Step pivot transfers: Contact guard assist     General transfer comment: CGA for safety, LOB several times ambulating in room      Balance Overall balance assessment: Needs assistance Sitting-balance support: No upper extremity supported, Feet supported Sitting balance-Leahy Scale: Good     Standing balance support: Single extremity supported, During functional activity Standing balance-Leahy Scale: Poor Standing balance comment: Pt able to ambualte unassisted but due to frequent LOB and stiffness in BLEs, Pt has poor balance recovery, RW for support                           ADL either performed or assessed with clinical judgement   ADL Overall ADL's : Needs assistance/impaired                                       General ADL Comments: supervision for safety, CGA for ambulation using RW.     Vision Baseline Vision/History: 1 Wears glasses Ability to See in Adequate Light: 0 Adequate Patient  Visual Report: No change from baseline       Perception         Praxis         Pertinent Vitals/Pain Pain Assessment Pain Assessment: No/denies pain     Extremity/Trunk Assessment Upper Extremity Assessment Upper Extremity Assessment: Overall WFL for tasks assessed           Communication Communication Communication: No apparent difficulties   Cognition Arousal: Alert Behavior During Therapy: WFL for tasks assessed/performed Cognition: No apparent impairments                               Following commands: Intact        Cueing  General Comments   Cueing Techniques: Verbal cues      Exercises     Shoulder Instructions      Home Living Family/patient expects to be discharged to:: Private residence Living Arrangements: Alone Available Help at Discharge: Family;Available PRN/intermittently Type of Home: House Home Access: Level entry     Home Layout: One level     Bathroom Shower/Tub: Chief Strategy Officer: Standard     Home Equipment: Cane - single point   Additional Comments: Pt lives alone, brothers/daughters who can assist daily if needed.      Prior Functioning/Environment Prior Level of Function : Independent/Modified Independent;Driving             Mobility Comments: ocassional use of cane when coming back from HD.      OT Problem List: Decreased strength;Decreased range of motion;Impaired balance (sitting and/or standing)   OT Treatment/Interventions:        OT Goals(Current goals can be found in the care plan section)   Acute Rehab OT Goals Patient Stated Goal: to return home OT Goal Formulation: With patient Time For Goal Achievement: 07/31/23 Potential to Achieve Goals: Good   OT Frequency:       Co-evaluation              AM-PAC OT "6 Clicks" Daily Activity     Outcome Measure Help from another person eating meals?: None Help from another person taking care of personal grooming?: A Little Help from another person toileting, which includes using toliet, bedpan, or urinal?: A Little Help from another person bathing (including washing, rinsing, drying)?: A Little Help from another person to put on and taking off regular upper body clothing?: A Little Help from another person to put on and taking off regular lower body clothing?: A Little 6 Click Score: 19   End of Session Equipment Utilized During Treatment: Gait belt;Rolling walker (2 wheels) Nurse Communication: Mobility status  Activity Tolerance: Patient tolerated treatment  well Patient left: in chair;with call bell/phone within reach;with family/visitor present  OT Visit Diagnosis: Unsteadiness on feet (R26.81);Other abnormalities of gait and mobility (R26.89)                Time: 0454-0981 OT Time Calculation (min): 28 min Charges:  OT General Charges $OT Visit: 1 Visit OT Evaluation $OT Eval Low Complexity: 1 Low OT Treatments $Self Care/Home Management : 8-22 mins  [address] OTR/L   Scherry Curtis 07/17/2023, 1:28 PM

## 2023-07-17 NOTE — Progress Notes (Signed)
  Progress Note   Patient: Karen [name] MWU:[id number] DOB: 1955/09/11 DOA: 07/15/2023     2 DOS: the patient was seen and examined on 07/17/2023   Brief hospital course: 68 year old patient with a history of ESRD on HD TTS, renal transplant 2017, HLD, HTN, seizure disorder, and PVD who presented to the ER with gradually progressive shortness of breath over the past 2 to 3 days. She had not missed any dialysis and had full sessions each visit. She denied dietary noncompliance. On arrival she was not markedly hypoxic but she was clearly dyspneic with tachypnea up to 33 breaths/min.   Assessment and Plan: Volume overload Management per dialysis - Nephrology following - felt to be simple volume overload related to need to adjust her EDW target -Going to HD today   Consolidative mass right lower lobe Noted incidentally on CTa chest which was obtained to rule out PE -likely atelectasis related to right pleural effusion but radiology reports that mass cannot be entirely excluded -repeat imaging as outpatient indicated -no clinical evidence to suggest an acute infection/pneumonia   ESRD on HD TTS Nephrology following for HD   HTN - BP stable   HLD Continue usual home medical therapy   Seizure disorder Continue usual home medications   Status post renal transplant 2017 with subsequent failure -Continue chronic prednisone   Subjective: Without complaints  Physical Exam: Vitals:   07/17/23 1715 07/17/23 1745 07/17/23 1815 07/17/23 1823  BP: (!) 152/71 (!) 145/66 (!) 130/59 (!) 144/78  Pulse: 75 82 (!) 59 71  Resp: 16 (!) 22 19 20   Temp:    (!) 97.5 F (36.4 C)  TempSrc:      SpO2: 100% 98% 92% 98%  Weight:      Height:       General exam: Awake, laying in bed, in nad Respiratory system: Normal respiratory effort, no audible wheezing Cardiovascular system: Perfused, no notable  Gastrointestinal system: Soft, nondistended, positive BS Central nervous system: CN2-12 grossly  intact, strength intact Extremities: no clubbing Skin: Normal skin turgor, no notable skin lesions seen Psychiatry: mood appears normal, affect appears normal  Data Reviewed:  Labs reviewed: Na 133, K 3.6, Cr 7.46, WBC 6.2, Hgb 9.5  Family Communication: Pt in room, family not at bedside  Disposition: Status is: Inpatient Remains inpatient appropriate because: severity of illness  Planned Discharge Destination: Home    Author: Cherylle Corwin, MD 07/17/2023 6:44 PM  For on call review [email or web address].

## 2023-07-17 NOTE — Progress Notes (Signed)
 Mobility Specialist Progress Note:   07/17/23 0900  Oxygen Therapy  O2 Device Nasal Cannula  O2 Flow Rate (L/min) 2 L/min  Mobility  Activity Ambulated with assistance in hallway  Level of Assistance Contact guard assist, steadying assist  Assistive Device None  Distance Ambulated (ft) 70 ft  Activity Response Tolerated well  Mobility Referral Yes  Mobility visit 1 Mobility  Mobility Specialist Start Time (ACUTE ONLY) 0844  Mobility Specialist Stop Time (ACUTE ONLY) 0850  Mobility Specialist Time Calculation (min) (ACUTE ONLY) 6 min    During Mobility: 96% SpO2  Pt received in bed and agreeable. Required only minG throughout. Took x1 standing rest break d/t fatigue and SOB. SpO2 levels in 90s throughout on 2L O2. Pt left in bed with call bell and all needs met.  D'Vante Nolon Baxter Mobility Specialist Please contact via Special educational needs teacher or Rehab office at [phone]

## 2023-07-17 NOTE — Procedures (Signed)
 Received patient in bed to unit.  Alert and oriented.  Informed consent signed and in chart.   TX duration: 3 hours  Patient tolerated well.  Transported back to the room  Alert, without acute distress.  Hand-off given to patient's nurse.   Access used: right fistula Access issues: none  Total UF removed: 2.5 liters Medication(s) given: heparin   Clover Dao, RN Kidney Dialysis Unit

## 2023-07-18 DIAGNOSIS — E8770 Fluid overload, unspecified: Secondary | ICD-10-CM

## 2023-07-18 DIAGNOSIS — N289 Disorder of kidney and ureter, unspecified: Secondary | ICD-10-CM

## 2023-07-18 DIAGNOSIS — R531 Weakness: Secondary | ICD-10-CM | POA: Diagnosis not present

## 2023-07-18 LAB — COMPREHENSIVE METABOLIC PANEL WITH GFR
ALT: 7 U/L (ref 0–44)
AST: 19 U/L (ref 15–41)
Albumin: 3.1 g/dL — ABNORMAL LOW (ref 3.5–5.0)
Alkaline Phosphatase: 72 U/L (ref 38–126)
Anion gap: 14 (ref 5–15)
BUN: 9 mg/dL (ref 8–23)
CO2: 27 mmol/L (ref 22–32)
Calcium: 9.2 mg/dL (ref 8.9–10.3)
Chloride: 93 mmol/L — ABNORMAL LOW (ref 98–111)
Creatinine, Ser: 5.05 mg/dL — ABNORMAL HIGH (ref 0.44–1.00)
GFR, Estimated: 9 mL/min — ABNORMAL LOW (ref >60)
Glucose, Bld: 88 mg/dL (ref 70–99)
Potassium: 3.3 mmol/L — ABNORMAL LOW (ref 3.5–5.1)
Sodium: 134 mmol/L — ABNORMAL LOW (ref 135–145)
Total Bilirubin: 0.7 mg/dL (ref 0.0–1.2)
Total Protein: 7.7 g/dL (ref 6.5–8.1)

## 2023-07-18 LAB — CBC
HCT: 30.2 % — ABNORMAL LOW (ref 36.0–46.0)
Hemoglobin: 10 g/dL — ABNORMAL LOW (ref 12.0–15.0)
MCH: 31 pg (ref 26.0–34.0)
MCHC: 33.1 g/dL (ref 30.0–36.0)
MCV: 93.5 fL (ref 80.0–100.0)
Platelets: 255 10*3/uL (ref 150–400)
RBC: 3.23 MIL/uL — ABNORMAL LOW (ref 3.87–5.11)
RDW: 14 % (ref 11.5–15.5)
WBC: 5.6 10*3/uL (ref 4.0–10.5)
nRBC: 0 % (ref 0.0–0.2)

## 2023-07-18 MED ORDER — LIDOCAINE-PRILOCAINE 2.5-2.5 % EX CREA: 1.0000 | TOPICAL_CREAM | CUTANEOUS | Status: DC | PRN

## 2023-07-18 MED ORDER — ALTEPLASE 2 MG IJ SOLR: 2.0000 mg | Freq: Once | INTRAMUSCULAR | Status: DC | PRN

## 2023-07-18 MED ORDER — ANTICOAGULANT SODIUM CITRATE 4% (200MG/5ML) IV SOLN: 5.0000 mL | Status: DC | PRN

## 2023-07-18 MED ORDER — HEPARIN SODIUM (PORCINE) 1000 UNIT/ML DIALYSIS
2400.0000 [IU] | Freq: Once | INTRAMUSCULAR | Status: DC
  Filled 2023-07-18: qty 3

## 2023-07-18 MED ORDER — PENTAFLUOROPROP-TETRAFLUOROETH EX AERO: 1.0000 | INHALATION_SPRAY | CUTANEOUS | Status: DC | PRN

## 2023-07-18 MED ORDER — HEPARIN SODIUM (PORCINE) 1000 UNIT/ML DIALYSIS: 1000.0000 [IU] | INTRAMUSCULAR | Status: DC | PRN

## 2023-07-18 MED ORDER — NEPRO/CARBSTEADY PO LIQD: 237.0000 mL | ORAL | Status: DC | PRN

## 2023-07-18 MED ORDER — POTASSIUM CHLORIDE CRYS ER 20 MEQ PO TBCR
40.0000 meq | EXTENDED_RELEASE_TABLET | Freq: Once | ORAL | Status: AC
  Administered 2023-07-18: 40 meq via ORAL
  Filled 2023-07-18: qty 2

## 2023-07-18 NOTE — Progress Notes (Signed)
 D/C order noted. Contacted [facility] to be advised of pt's d/c today and that pt should resume care on Saturday.   Olivia Canter Renal Navigator [phone]

## 2023-07-18 NOTE — TOC Transition Note (Signed)
 Transition of Care [facility]) - Discharge Note   Patient Details  Name: Karen [name] MRN: [id number] Date of Birth: [birth year]  [facility]) CM/SW Contact:  Tom-Johnson, Angelique Ken, RN Phone Number: 07/18/2023, 3:11 PM   Clinical Narrative:     Patient is scheduled for discharge today.  Readmission Risk Assessment done. Outpatient PT info, hospital f/u and discharge instructions on AVS. RW delivered to patient at bedside. [employer] to deliver portable Home O2 tank to patient prior discharge. Patient can wait for her ride at the discharge lounge for portable tank.  Niece, Janie Rose to transport at discharge.  No further TOC needs noted.      Final next level of care: OP Rehab Barriers to Discharge: Barriers Resolved   Patient Goals and CMS Choice Patient states their goals for this hospitalization and ongoing recovery are:: To return home CMS Medicare.gov Compare Post Acute Care list provided to:: Patient Choice offered to / list presented to : Patient      Discharge Placement                Patient to be transferred to facility by: Niece Name of family member notified: Janie Rose    Discharge Plan and Services Additional resources added to the After Visit Summary for                  DME Arranged: Oxygen, Walker rolling DME Agency: [employer] Date DME Agency Contacted: 07/18/23 Time DME Agency Contacted: [phone] Representative spoke with at DME Agency: Raechel Bulla HH Arranged: NA HH Agency: NA        Social Drivers of Health (SDOH) Interventions SDOH Screenings   Food Insecurity: No Food Insecurity (07/15/2023)  Housing: Low Risk  (07/15/2023)  Transportation Needs: No Transportation Needs (07/15/2023)  Utilities: Not At Risk (07/15/2023)  Social Connections: Moderately Integrated (07/15/2023)  Tobacco Use: Medium Risk (07/15/2023)     Readmission Risk Interventions    07/16/2023    4:04 PM 06/20/2021    3:30 PM  Readmission Risk Prevention  Plan  Transportation Screening Complete Complete  PCP or Specialist Appt within 5-7 Days Complete   PCP or Specialist Appt within 3-5 Days  Complete  Home Care Screening Complete   Medication Review (RN CM) Referral to Pharmacy   HRI or Home Care Consult  Complete  Social Work Consult for Recovery Care Planning/Counseling  Complete  Palliative Care Screening  Not Applicable  Medication Review Oceanographer)  Complete

## 2023-07-18 NOTE — TOC Progression Note (Signed)
 Transition of Care [facility]) - Progression Note    Patient Details  Name: Karen [name] MRN: [id number] Date of Birth: May 25, 1955  Transition of Care [facility]) CM/SW Contact  Tom-Johnson, Margrit Minner Daphne, RN Phone Number: 07/18/2023, 9:54 AM  Clinical Narrative:     Outpatient PT recommended by PT, patient chose [facility], info on AVS. RW and home O2 ordered from [employer] and [facility] to deliver to patient at bedside prior discharge.   CM will continue to follow as patient progresses with care towards discharge.        Expected Discharge Plan and Services                                               Social Determinants of Health (SDOH) Interventions SDOH Screenings   Food Insecurity: No Food Insecurity (07/15/2023)  Housing: Low Risk  (07/15/2023)  Transportation Needs: No Transportation Needs (07/15/2023)  Utilities: Not At Risk (07/15/2023)  Social Connections: Moderately Integrated (07/15/2023)  Tobacco Use: Medium Risk (07/15/2023)    Readmission Risk Interventions    07/16/2023    4:04 PM 06/20/2021    3:30 PM  Readmission Risk Prevention Plan  Transportation Screening Complete Complete  PCP or Specialist Appt within 5-7 Days Complete   PCP or Specialist Appt within 3-5 Days  Complete  Home Care Screening Complete   Medication Review (RN CM) Referral to Pharmacy   HRI or Home Care Consult  Complete  Social Work Consult for Recovery Care Planning/Counseling  Complete  Palliative Care Screening  Not Applicable  Medication Review Oceanographer)  Complete

## 2023-07-18 NOTE — Progress Notes (Signed)
 Mobility Specialist Progress Note:   07/18/23 0900  Mobility  Activity Ambulated with assistance in hallway  Level of Assistance Contact guard assist, steadying assist  Assistive Device None  Distance Ambulated (ft) 70 ft  Activity Response Tolerated well  Mobility Referral Yes  Mobility visit 1 Mobility  Mobility Specialist Start Time (ACUTE ONLY) 0839  Mobility Specialist Stop Time (ACUTE ONLY) 0846  Mobility Specialist Time Calculation (min) (ACUTE ONLY) 7 min    Pre Mobility: 91% SpO2 RA During Mobility:  84% SpO2 RA                            95% SpO2 1L Post Mobility:  95% SpO2 RA  Pt  received in chair and agreeable. Desat to 84% SpO2 during ambulation, quickly recovering to 95% on 1L O2. Denied any SOB. Pt left in chair with call bell and all needs met. SpO2 levels at 95% on room air at rest.  D'Vante Nolon Baxter Mobility Specialist Please contact via SecureChat or Rehab office at [phone]

## 2023-07-18 NOTE — Discharge Summary (Signed)
 Physician Discharge Summary   Patient: Karen [name] MRN: [id number] DOB: March 21, 1956  Admit date:     07/15/2023  Discharge date: 07/18/23  Discharge Physician: Rickey Barbara   PCP: Bufford Buttner, MD   Recommendations at discharge:    Follow up with PCP in 1-2 weeks  Discharge Diagnoses: Principal Problem:   Volume overload  Resolved Problems:   * No resolved hospital problems. [facility] Course: 68 year old patient with a history of ESRD on HD TTS, renal transplant 2017, HLD, HTN, seizure disorder, and PVD who presented to the ER with gradually progressive shortness of breath over the past 2 to 3 days. She had not missed any dialysis and had full sessions each visit. She denied dietary noncompliance. On arrival she was not markedly hypoxic but she was clearly dyspneic with tachypnea up to 33 breaths/min.   Assessment and Plan: Volume overload Management per dialysis - Nephrology following - felt to be simple volume overload related to need to adjust her EDW target -Improved with dialysis -EDW was adjusted on d/c   Consolidative mass right lower lobe Noted incidentally on CTa chest which was obtained to rule out PE -likely atelectasis related to right pleural effusion but radiology reports that mass cannot be entirely excluded -repeat imaging as outpatient indicated  -no clinical evidence to suggest an acute infection/pneumonia   ESRD on HD TTS Nephrology had been following for HD   HTN - BP stable   HLD Continue usual home medical therapy   Seizure disorder Continue usual home medications   Status post renal transplant 2017 with subsequent failure -Continue chronic prednisone    Consultants: Nephrology Procedures performed:   Disposition: Home Diet recommendation:  Renal diet DISCHARGE MEDICATION: Allergies as of 07/18/2023   No Known Allergies      Medication List     STOP taking these medications    amoxicillin 250 MG capsule Commonly known as:  AMOXIL       TAKE these medications    aspirin EC 81 MG tablet Take 81 mg by mouth daily.   calcitRIOL 0.25 MCG capsule Commonly known as: ROCALTROL Take 0.25 mcg by mouth every Monday, Wednesday, and Friday.   clopidogrel 75 MG tablet Commonly known as: Plavix Take 1 tablet (75 mg total) by mouth daily.   HECTOROL IV Doxercalciferol (Hectorol)   hydrALAZINE 100 MG tablet Commonly known as: APRESOLINE Take 100 mg by mouth 3 (three) times daily.   magnesium oxide 400 MG tablet Commonly known as: MAG-OX Take 400 mg by mouth 2 (two) times daily.   omeprazole 20 MG capsule Commonly known as: PRILOSEC Take 20 mg by mouth daily.   predniSONE 2.5 MG tablet Commonly known as: DELTASONE Take 2.5 mg by mouth daily.   sevelamer carbonate 800 MG tablet Commonly known as: RENVELA Take 800 mg by mouth 3 (three) times daily with meals. Take 800 mg tablet with snacks   simvastatin 20 MG tablet Commonly known as: ZOCOR Take 20 mg by mouth every evening.   Ventolin HFA 108 (90 Base) MCG/ACT inhaler Generic drug: albuterol Inhale 1 puff into the lungs 3 times/day as needed-between meals & bedtime. What changed: reasons to take this               Durable Medical Equipment  (From admission, onward)           Start     Ordered   07/18/23 0948  For home use only DME Walker rolling  Once  Question Answer Comment  Walker: With 5 Inch Wheels   Patient needs a walker to treat with the following condition Gait instability      07/18/23 0947            Follow-up Information     [facility] Follow up.   Specialty: Rehabilitation Why: Call to schedule appointment. Contact information: [address] [place]  [address] [phone]        Leandra Pro, MD Follow up in 2 week(s).   Specialty: Nephrology Why: Hospital follow up Contact information: [address] [place]  [address] [phone]                Discharge Exam: Cleavon Curls Weights   07/15/23 1045 07/17/23 1840  Weight: 62.8 kg 58.2 kg   General exam: Awake, laying in bed, in nad Respiratory system: Normal respiratory effort, no wheezing Cardiovascular system: regular rate, s1, s2 Gastrointestinal system: Soft, nondistended, positive BS Central nervous system: CN2-12 grossly intact, strength intact Extremities: Perfused, no clubbing Skin: Normal skin turgor, no notable skin lesions seen Psychiatry: Mood normal // no visual hallucinations   Condition at discharge: fair  The results of significant diagnostics from this hospitalization (including imaging, microbiology, ancillary and laboratory) are listed below for reference.   Imaging Studies: CT Angio Chest PE W and/or Wo Contrast Result Date: 07/15/2023 CLINICAL DATA:  Pulmonary embolism (PE) suspected, low to intermediate prob, positive D-dimer EXAM: CT ANGIOGRAPHY CHEST WITH CONTRAST TECHNIQUE: Multidetector CT imaging of the chest was performed using the standard protocol during bolus administration of intravenous contrast. Multiplanar CT image reconstructions and MIPs were obtained to evaluate the vascular anatomy. RADIATION DOSE REDUCTION: This exam was performed according to the departmental dose-optimization program which includes automated exposure control, adjustment of the mA and/or kV according to patient size and/or use of iterative reconstruction technique. CONTRAST:  75mL OMNIPAQUE IOHEXOL 350 MG/ML SOLN COMPARISON:  Chest radiographs dated 07/15/2023 and 02/27/2022. FINDINGS: Cardiovascular: Satisfactory opacification of the pulmonary arteries to the segmental level. No evidence of pulmonary embolism. Heart is enlarged. Mitral annular calcification. Thoracic aorta is normal in caliber with atherosclerotic calcification. Mediastinum/Nodes: Prominent mediastinal and hilar lymph nodes, with a right hilar lymph node measuring up to 10 mm  in short axis (series 5, image 64). No enlarged axillary lymph nodes. Thyroid gland, trachea, and esophagus demonstrate no significant abnormality. Lungs/Pleura: Moderate right and small left pleural effusions with associated bibasilar atelectasis. Consolidative opacities at the right lung base. Bilateral interlobular septal thickening. No pneumothorax. Upper Abdomen: No acute abnormality. Musculoskeletal: Severe degenerative changes of the right glenohumeral joint. Degenerative changes of the thoracic spine. No suspicious osseous lesion. Review of the MIP images confirms the above findings. IMPRESSION: 1. No evidence of pulmonary embolism. 2. Moderate right and small left pleural effusions with associated bibasilar atelectasis. 3. Consolidative opacity at the right lung base could represent atelectasis or infiltrate, however, underlying mass can not be entirely excluded. 4. Cardiomegaly with findings compatible with pulmonary edema. 5. Prominent nonspecific mediastinal and hilar lymph nodes may be reactive. Aortic Atherosclerosis (ICD10-I70.0). Electronically Signed   By: Mannie Seek M.D.   On: 07/15/2023 18:40   DG Chest Port 1 View Result Date: 07/15/2023 CLINICAL DATA:  Shortness of breath EXAM: PORTABLE CHEST 1 VIEW COMPARISON:  X-ray 02/27/2022 and older FINDINGS: Persistent moderate right effusion, slightly decreased today from previous. Small left effusion is new. Increasing lung base opacities with some interstitial components. Enlarged cardiopericardial silhouette. Question component  of edema. No pneumothorax. Overlapping cardiac leads. Prominent calcifications along the tracheobronchial tree. IMPRESSION: Moderate right effusion, decreased from previous however. New small left effusion. Increasing lung base opacities and interstitial changes. Enlarged heart. Electronically Signed   By: Adrianna Horde M.D.   On: 07/15/2023 14:05    Microbiology: Results for orders placed or performed during the  hospital encounter of 07/15/23  MRSA Next Gen by PCR, Nasal     Status: None   Collection Time: 07/16/23  5:50 AM   Specimen: Nasal Mucosa; Nasal Swab  Result Value Ref Range Status   MRSA by PCR Next Gen NOT DETECTED NOT DETECTED Final    Comment: (NOTE) The GeneXpert MRSA Assay (FDA approved for NASAL specimens only), is one component of a comprehensive MRSA colonization surveillance program. It is not intended to diagnose MRSA infection nor to guide or monitor treatment for MRSA infections. Test performance is not FDA approved in patients less than 68 years old. Performed at [facility], [address], [place], [place] [address]     Labs: CBC: Recent Labs  Lab 07/15/23 1133 07/15/23 1147 07/16/23 0445 07/17/23 1202 07/18/23 0439  WBC 6.9  --  6.3 6.2 5.6  NEUTROABS 4.3  --   --   --   --   HGB 9.8* 10.2* 9.2* 9.5* 10.0*  HCT 30.5* 30.0* 28.5* 28.9* 30.2*  MCV 96.2  --  95.0 93.5 93.5  PLT 252  --  236 234 255   Basic Metabolic Panel: Recent Labs  Lab 07/15/23 1133 07/15/23 1147 07/16/23 0445 07/17/23 1202 07/18/23 0439  NA 137 137 134* 133* 134*  K 3.4* 3.5 3.4* 3.6 3.3*  CL 95*  --  95* 93* 93*  CO2 27  --  26 28 27   GLUCOSE 89  --  69* 90 88  BUN 15  --  <5* 15 9  CREATININE 8.04*  --  4.20* 7.46* 5.05*  CALCIUM 9.1  --  8.6* 8.9 9.2  PHOS  --   --  1.8* 1.9*  --    Liver Function Tests: Recent Labs  Lab 07/15/23 1133 07/16/23 0445 07/17/23 1202 07/18/23 0439  AST 22  --   --  19  ALT 8  --   --  7  ALKPHOS 75  --   --  72  BILITOT 1.5*  --   --  0.7  PROT 8.0  --   --  7.7  ALBUMIN 3.4* 3.0* 2.9* 3.1*   CBG: No results for input(s): "GLUCAP" in the last 168 hours.  Discharge time spent: less than 30 minutes.  Signed: Cherylle Corwin, MD [facility] 07/18/2023

## 2023-07-18 NOTE — Discharge Planning (Signed)
 [facility] Patient Discharge Orders- [facility] CLINIC: [facility]  Patient's name: Karen [name] Admit/DC Dates: 07/15/2023 - 07/18/2023  Discharge Diagnoses: Volume Overload     Aranesp: Given: No   Date and amount of last dose: NA  PRBC's Given: NA Date/# of units: NA Last Hgb: 10.0 ESA dose for discharge: mircera 150 mcg IV q 2 weeks  IV Iron dose at discharge: Per protocol  Heparin change: No  EDW Change: Yes New EDW: 60.5   Bath Change: No  Access intervention/Change: No Details:  Hectorol/Calcitriol change: No. Phosphorous 1.9. Hold binders. Liberalize diet. Draw weekly phosphorous X 4  Discharge Labs: Calcium 9.2 Phosphorus 1.9 Albumin 3.2 K+ 3.3  IV Antibiotics: NA Details:  On Coumadin?: No Last INR: Next INR: Managed By:   OTHER/APPTS/LAB ORDERS: [facility] [phone]     D/C Meds to be reconciled by nurse after every discharge.  Completed By:   Reviewed by: MD:______ RN_______

## 2023-07-18 NOTE — Progress Notes (Addendum)
 [facility] Progress Note   Subjective: Up in chair, wants to go home. Will do HD tomorrow if she is not discharged, otherwise HD at OP center 07/20/2023.     Objective Vitals:   07/17/23 1823 07/17/23 1840 07/17/23 1900 07/18/23 0814  BP: (!) 144/78 (!) 161/70 138/70 (!) 149/75  Pulse: 71 89 92 (!) 53  Resp: 20 16 18 18   Temp: (!) 97.5 F (36.4 C)  (!) 97.5 F (36.4 C) 98.2 F (36.8 C)  TempSrc:   Axillary Oral  SpO2: 98% 100% 100%   Weight:  58.2 kg    Height:       Physical Exam General: Pleasant older female in NAD Heart: S1,S2 RRR no M/R/G Lungs: CTAB A/P Abdomen: NABS, NT Extremities: No LE edema Dialysis Access: R AVF + T/B  Additional Objective Labs: Basic Metabolic Panel: Recent Labs  Lab 07/16/23 0445 07/17/23 1202 07/18/23 0439  NA 134* 133* 134*  K 3.4* 3.6 3.3*  CL 95* 93* 93*  CO2 26 28 27   GLUCOSE 69* 90 88  BUN <5* 15 9  CREATININE 4.20* 7.46* 5.05*  CALCIUM 8.6* 8.9 9.2  PHOS 1.8* 1.9*  --    Liver Function Tests: Recent Labs  Lab 07/15/23 1133 07/16/23 0445 07/17/23 1202 07/18/23 0439  AST 22  --   --  19  ALT 8  --   --  7  ALKPHOS 75  --   --  72  BILITOT 1.5*  --   --  0.7  PROT 8.0  --   --  7.7  ALBUMIN 3.4* 3.0* 2.9* 3.1*   No results for input(s): "LIPASE", "AMYLASE" in the last 168 hours. CBC: Recent Labs  Lab 07/15/23 1133 07/15/23 1147 07/16/23 0445 07/17/23 1202 07/18/23 0439  WBC 6.9  --  6.3 6.2 5.6  NEUTROABS 4.3  --   --   --   --   HGB 9.8*   < > 9.2* 9.5* 10.0*  HCT 30.5*   < > 28.5* 28.9* 30.2*  MCV 96.2  --  95.0 93.5 93.5  PLT 252  --  236 234 255   < > = values in this interval not displayed.   Blood Culture    Component Value Date/Time   SDES BLOOD SITE NOT SPECIFIED 02/28/2022 0310   SPECREQUEST  02/28/2022 0310    BOTTLES DRAWN AEROBIC AND ANAEROBIC Blood Culture adequate volume   CULT  02/28/2022 0310    NO GROWTH 5 DAYS Performed at [facility], [address] [place], [place] [address]    REPTSTATUS 03/05/2022 FINAL 02/28/2022 0310    Cardiac Enzymes: No results for input(s): "CKTOTAL", "CKMB", "CKMBINDEX", "TROPONINI" in the last 168 hours. CBG: No results for input(s): "GLUCAP" in the last 168 hours. Iron Studies: No results for input(s): "IRON", "TIBC", "TRANSFERRIN", "FERRITIN" in the last 72 hours. @lablastinr3 @ Studies/Results: No results found. Medications:  anticoagulant sodium citrate      aspirin EC  81 mg Oral Daily   calcitRIOL  0.25 mcg Oral Q M,W,F   Chlorhexidine Gluconate Cloth  6 each Topical Q0600   clopidogrel  75 mg Oral Daily   heparin  2,400 Units Dialysis Once in dialysis   heparin  5,000 Units Subcutaneous Q8H   hydrALAZINE  100 mg Oral TID   isosorbide dinitrate  5 mg Oral BID   magnesium oxide  400 mg Oral BID   pantoprazole  40 mg Oral Daily   predniSONE  2.5 mg Oral QAC breakfast   senna  1 tablet Oral BID   simvastatin  20 mg Oral QPM     OP HD: [place] [facility] 3.5 hours 180NRe 400/600 62.5 kg 3.0 K/2.0 Ca AVF - Heparin 2400 units IV three times per week - Hectorol 5 mcg IV three times per week - Mircera 50 mcg IV q 2 weeks (last dose 07/06/2023-due 07/20/2023)   Assessment/ Plan: SOB: w/ pulm edema on CXR, orthopnea, rales on exam. Pt has poor appetite and recently has had the dry wt lowered. Suspect she continues to lose body wt causing volume overload in esrd pt. Needs HD 07/15/2023 Net UF 3.5 liters ESRD: on HD TTS. HD off schedule 07/15/2023. Has been on MWF this week. Next HD 07/19/2023 if still in hospital, otherwise resume T,Th,S Schedule at OP center 07/20/2023 HTN/Volume: BP reasonably controlled after volume removal yesterday. Continue current meds-on hydralazine without BB. UF as tolerated. Optimize volume with dialysis.   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 ESA due 07/20/2023. Will order.   Secondary hyperparathyroidism: CCa in range, PO4 very low. Hold binders. K+ and PO4 both low. Liberalize  diet. Zulma Hitt. Kaci Dillie NP-C 07/18/2023, 11:56 AM  [employer] [phone]

## 2023-07-18 NOTE — Progress Notes (Signed)
 SATURATION QUALIFICATIONS: (This note is used to comply with regulatory documentation for home oxygen)   Patient Saturations on Room Air at Rest = 88%   Patient Saturations on Room Air while Ambulating = 84%   Patient Saturations on 1 Liters of oxygen while Ambulating = 94%   Please briefly explain why patient needs home oxygen: Pt desats when ambulating. Pt also desats when sleeping. Pt would benefit from oxygen

## 2023-07-19 ENCOUNTER — Telehealth: Payer: Self-pay | Admitting: Nurse Practitioner

## 2023-07-19 NOTE — Telephone Encounter (Signed)
 Transition of Care - Initial Contact after Hospitalization  Date of discharge:  07/18/23 Date of contact: 07/19/23  Method: Phone Spoke to: Patient  Patient contacted to discuss transition of care from recent inpatient hospitalization. Patient was admitted to [facility] from 07/15/23 to 07/18/23... with discharge diagnosis of acute volume overload  .  The discharge medication list was reviewed. Patient understands the changes and has no concerns.   Patient will return to his/her outpatient HD unit on: 07/20/23  No other concerns at this time

## 2023-07-22 ENCOUNTER — Ambulatory Visit: Payer: Medicare Other | Admitting: Podiatry

## 2023-07-31 ENCOUNTER — Other Ambulatory Visit: Payer: Self-pay

## 2023-07-31 ENCOUNTER — Encounter: Payer: Self-pay | Admitting: Physical Therapy

## 2023-07-31 ENCOUNTER — Ambulatory Visit: Attending: Internal Medicine | Admitting: Physical Therapy

## 2023-07-31 DIAGNOSIS — M6281 Muscle weakness (generalized): Secondary | ICD-10-CM | POA: Diagnosis present

## 2023-07-31 DIAGNOSIS — R531 Weakness: Secondary | ICD-10-CM | POA: Insufficient documentation

## 2023-07-31 DIAGNOSIS — R2689 Other abnormalities of gait and mobility: Secondary | ICD-10-CM | POA: Diagnosis present

## 2023-07-31 NOTE — Therapy (Signed)
 OUTPATIENT PHYSICAL THERAPY EVALUATION   Patient Name: Karen [name] MRN: [id number] DOB:04-02-1956, 68 y.o., female Today's Date: 07/31/2023  END OF SESSION:  PT End of Session - 07/31/23 1246     Visit Number 1    Number of Visits 13    Date for PT Re-Evaluation 09/11/23    PT Start Time 1209    PT Stop Time 1239    PT Time Calculation (min) 30 min    Activity Tolerance Patient limited by fatigue    Behavior During Therapy [facility] for tasks assessed/performed             Past Medical History:  Diagnosis Date   Anemia associated with chronic renal failure    Arthritis    Convulsions/seizures (HCC) 01/28/2013   Dyslipidemia    GERD (gastroesophageal reflux disease)    Hyperlipidemia    Hypertension    Kidney transplanted    Peripheral vascular disease (HCC)    Seizure disorder (HCC)    Stroke (HCC)    "possible mini stroke" years ago per patient- patient no longer sees neurologist   Past Surgical History:  Procedure Laterality Date   A/V FISTULAGRAM N/A 03/15/2023   Procedure: A/V Fistulagram;  Surgeon: Patrick Boor, MD;  Location: [facility];  Service: Cardiovascular;  Laterality: N/A;   ABDOMINAL AORTOGRAM W/LOWER EXTREMITY Left 04/25/2023   Procedure: ABDOMINAL AORTOGRAM W/LOWER EXTREMITY;  Surgeon: Young Hensen, MD;  Location: [facility];  Service: Cardiovascular;  Laterality: Left;   CATHETER REMOVAL     COLONOSCOPY     GALLBLADDER SURGERY     INSERTION OF DIALYSIS CATHETER Left 08/11/2021   Procedure: INSERTION LEFT INTERNAL JUGULAR TUNNELED DIALYSIS CATHETER;  Surgeon: Young Hensen, MD;  Location: [facility] OR;  Service: Vascular;  Laterality: Left;   KIDNEY TRANSPLANT     04/10/2009   PERIPHERAL VASCULAR BALLOON ANGIOPLASTY Left 04/25/2023   Procedure: PERIPHERAL VASCULAR BALLOON ANGIOPLASTY;  Surgeon: Young Hensen, MD;  Location: [facility];  Service: Cardiovascular;  Laterality: Left;   REVISON OF ARTERIOVENOUS  FISTULA Right 08/11/2021   Procedure: RIGHT ARM ARTERIOVENOUS FISTULA REVISION WITH ANEURYSM REMOVAL;  Surgeon: Young Hensen, MD;  Location: [facility] OR;  Service: Vascular;  Laterality: Right;   Patient Active Problem List   Diagnosis Date Noted   Volume overload 07/15/2023   Critical limb ischemia of left lower extremity (HCC) 04/16/2023   Malnutrition of moderate degree 03/01/2022   Community acquired pneumonia of left lung 02/27/2022   ESRD on hemodialysis (HCC) 08/04/2021   Hypertension 06/15/2021   Dyslipidemia 06/15/2021   Renal transplant recipient 08/13/2015   History of seizure 01/28/2013    PCP: Leandra Pro, MD  REFERRING PROVIDER: Oral Billings, MD  REFERRING DIAG: Weakness [R53.1]   Rationale for Evaluation and Treatment: Rehabilitation  THERAPY DIAG:  Muscle weakness (generalized)  Other abnormalities of gait and mobility  PERTINENT HISTORY: Hx of seizure, HTN, ESRD with dialysis, hx of stroke  WEIGHT BEARING RESTRICTIONS: No  FALLS:  Has patient fallen in last 6 months? No  LIVING ENVIRONMENT: Lives with: lives alone Lives in: House/apartment Stairs: No Has following equipment at home: Single point cane  OCCUPATION: retired   PRECAUTIONS: None ---------------------------------------------------------------------------------------------  SUBJECTIVE:  SUBJECTIVE STATEMENT: Feels like she needs to strengthen her legs, walks slower than her family during vacations. Occasionally has to take rest breaks when walking, has the most pain in her knees, 6/10 pain on a bad day. Has the most energy the days after dialysis. Dialysis is done on Tues, Thurs, and Saturday  Pt accompanied by: self  RED FLAGS: None   PLOF: Independent  PATIENT GOALS: get  stronger ---------------------------------------------------------------------------------------------  OBJECTIVE:  Note: Objective measures were completed at Evaluation unless otherwise noted. COGNITION: Overall cognitive status: Within functional limits for tasks assessed   SENSATION: WFL  COORDINATION: WFL  POSTURE: rounded shoulders and forward head  LOWER EXTREMITY ROM:     Passive  Right Eval Left Eval  Hip flexion    Hip extension    Hip abduction    Hip adduction    Hip internal rotation    Hip external rotation    Knee flexion    Knee extension    Ankle dorsiflexion    Ankle plantarflexion    Ankle inversion    Ankle eversion     (Blank rows = not tested)  LOWER EXTREMITY MMT:    MMT Right Eval Left Eval  Hip flexion 4+ 4-  Hip extension 4 4  Hip abduction 4 4  Hip adduction    Hip internal rotation    Hip external rotation    Knee flexion 4- 4-  Knee extension 4- 4-  Ankle dorsiflexion    Ankle plantarflexion    Ankle inversion    Ankle eversion    (Blank rows = not tested)  BED MOBILITY:  Sit to supine Complete Independence Supine to sit Complete Independence Rolling to Right Complete Independence Rolling to Left Complete Independence  TRANSFERS: Assistive device utilized: None  Sit to stand: Complete Independence Stand to sit: Complete Independence Chair to chair: Modified independence  STAIRS: Level of Assistance: Min A Stair Negotiation Technique: Step to Pattern with Bilateral Rails Number of Stairs: 4   GAIT: Gait pattern: decreased stance time- Right, decreased stride length, decreased hip/knee flexion- Right, decreased ankle dorsiflexion- Right, and decreased ankle dorsiflexion- Left Distance walked: 257ft Assistive device utilized: None Level of assistance: SBA Comments: weight shift right  FUNCTIONAL TESTS:  30 seconds chair stand test: 5 reps 2 minute walk test: 195 feet, (d/c at 1:30 d/t pt fatigue)  PATIENT  SURVEYS:  Patient-specific activity scoring scheme (Point to one number): (unable) 0-10 (able at or beyond PLOF)  Activity Initial 1.Stooping :3 2.Climbing stairs: 5 3.Walking long distances: 3    sum of the activity scores/number of activities; Total score =11  Minimum detectable change (90%CI) for average score = 2 points Minimum detectable change (90%CI) for single activity score = 3 points    Baseline vitals: BP: 130/60 MmHg HR: 80 bpm SPO2: 97% Respirations: 18  [facility] Adult PT Treatment:                                                DATE: 07/31/2023 Self Care: POC discussion Pt education    PATIENT EDUCATION: Education details:Pt received education regarding HEP performance, ADL performance, functional activity tolerance, impairment education, appropriate performance of therapeutic activities.  Person educated: Patient Education method: Explanation, Demonstration, Tactile cues, Verbal cues, and Handouts Education comprehension: verbalized understanding and returned demonstration  HOME EXERCISE PROGRAM: TBD next visit, want to review safe performance prior to HEP administration.  ---------------------------------------------------------------------------------------------  ASSESSMENT:  CLINICAL IMPRESSION: Eval impression (07/31/2023): Pt. attended today's physical therapy session for evaluation of generalized weakness. Pt has complaints of difficulties with stooping, walking long distances and climbing stairs. Pt has notable deficits with BLE weakness, gait quality, gait/activity tolerance.   Pt would benefit from therapeutic focus on global BLE strengthening, functional technique and strengthening of transfers, gait quality practice, and gradual progression of activity tolerance.  Treatment performed today focused on pt education detailed in obj.  Pt demonstrated good understanding of education provided. required minimal verbal/tactile cues and min assistance for appropriate performance with today's activities. Pt requires the intervention of skilled outpatient physical therapy to address the aforementioned deficits and progress towards a functional level in line with therapeutic goals.   OBJECTIVE IMPAIRMENTS: Abnormal gait, cardiopulmonary status limiting activity, decreased activity tolerance, decreased balance, decreased knowledge of use of DME, difficulty walking, decreased strength, decreased safety awareness, improper body mechanics, and pain.   ACTIVITY LIMITATIONS: carrying, bending, squatting, stairs, transfers, and locomotion level  PARTICIPATION LIMITATIONS: cleaning, laundry, interpersonal relationship, community activity, and church  PERSONAL FACTORS: Age, Behavior pattern, Fitness, and 3+ comorbidities: previous stroke, HTN, and ESRD  are also affecting patient's functional outcome.   REHAB POTENTIAL: Fair    CLINICAL DECISION MAKING: Stable/uncomplicated  EVALUATION COMPLEXITY: Moderate --------------------------------------------------------------------------------------------- GOALS: Goals reviewed with patient? Yes  SHORT TERM GOALS: Target date: 08/21/2023  Pt will be independent with administered HEP to demonstrate the competency necessary for long term managemnet of symptoms at home. Baseline: Goal status: INITIAL  LONG TERM GOALS: Target date: 09/11/2023   Pt will independently ambulate 460ft with no AD or LOB to demonstrate improved weightbearing tolerance, BLE strength, and functional capacity for community ambulation. Baseline: 219ft Goal status: INITIAL  2.  Pt will improve 30s STS to 7 reps to demonstrate improved functional strength with transfers. Baseline: 5 Goal status: INITIAL  3.  Pt will tolerate ( and ambulate at least 25ft with independence and no LOB to demonstrate improved  capacity for prolonged gait in the community. Baseline: d/c test at 1:30 Goal status: INITIAL  4.  Pt will improve global BLE strength to 4+/5 to dmeosntrate strength necessary for high quliaty movement during ADLs and functional movements such as stooping Baseline: see obj chart Goal status: INITIAL  5.  Pt will improve PSFS to 13 with initial 3 activities to demonstrate improved perceived function with common daily activities. Baseline: 11 Goal status: INITIAL   PLAN:  PT FREQUENCY: 1-2x/week  PT DURATION: 6 weeks  PLANNED INTERVENTIONS: 97110-Therapeutic exercises, 97530- Therapeutic activity, [id number]- Neuromuscular re-education, 97535- Self Care, 30865- Manual therapy, [phone]- Gait training, [phone]- Electrical stimulation (manual), Patient/Family education, Balance training, Stair training, and DME instructions  PLAN FOR NEXT SESSION: Review HEP, Continue with POC as detailed in assessment.  Albesa Huguenin, PT, DPT 07/31/2023, 12:58 PM  For all possible CPT codes, reference the Planned Interventions line above.     Check all conditions that are expected to impact treatment: {Conditions expected to impact treatment:Ongoing dialysis or cancer treatment   If treatment provided at initial evaluation, no treatment charged due to lack of authorization.

## 2023-08-05 ENCOUNTER — Ambulatory Visit (INDEPENDENT_AMBULATORY_CARE_PROVIDER_SITE_OTHER): Admitting: Podiatry

## 2023-08-05 ENCOUNTER — Encounter: Payer: Self-pay | Admitting: Podiatry

## 2023-08-05 VITALS — Ht 60.0 in | Wt 128.3 lb

## 2023-08-05 DIAGNOSIS — M79675 Pain in left toe(s): Secondary | ICD-10-CM

## 2023-08-05 DIAGNOSIS — B351 Tinea unguium: Secondary | ICD-10-CM

## 2023-08-05 DIAGNOSIS — M79674 Pain in right toe(s): Secondary | ICD-10-CM

## 2023-08-08 NOTE — Progress Notes (Signed)
  Subjective:  Patient ID: Karen [name], female    DOB: [birth year],  MRN: [id number]  Chief Complaint  Patient presents with   Nail Problem    Pt is her for RFC.     Doing much better after the last visit.  Her nails are thick and elongated causing pain and discomfort again.   Objective:    Physical Exam   EXTREMITIES: Nonpalpable pulses.  Capillary fill time is slow.  Thin shiny atrophic skin with minimal pedal hair growth.  Thickened elongated      No images are attached to the encounter.    Results   Angiography 04/25/2023 with PTA of posterior tibial artery with improved flow, images reviewed and has good flow to the midfoot but significant microvascular disease     Assessment:   Encounter Diagnosis  Name Primary?   Pain due to onychomycosis of toenails of both feet Yes       Plan:  Patient was evaluated and treated and all questions answered.  Assessment and Plan    Discussed the etiology and treatment options for the condition in detail with the patient. Recommended debridement of the nails today. Sharp and mechanical debridement performed of all painful and mycotic nails today. Nails debrided in length and thickness using a nail nipper to level of comfort.  With her PAD regular at risk footcare is recommended.

## 2023-08-14 ENCOUNTER — Ambulatory Visit: Attending: Internal Medicine | Admitting: Physical Therapy

## 2023-08-14 ENCOUNTER — Encounter: Payer: Self-pay | Admitting: Physical Therapy

## 2023-08-14 DIAGNOSIS — R2689 Other abnormalities of gait and mobility: Secondary | ICD-10-CM | POA: Diagnosis present

## 2023-08-14 DIAGNOSIS — M6281 Muscle weakness (generalized): Secondary | ICD-10-CM | POA: Diagnosis present

## 2023-08-14 NOTE — Therapy (Unsigned)
 OUTPATIENT PHYSICAL THERAPY EVALUATION   Patient Name: Karen [name] MRN: [id number] DOB:July 03, 1955, 68 y.o., female Today's Date: 08/14/2023  END OF SESSION:    Past Medical History:  Diagnosis Date   Anemia associated with chronic renal failure    Arthritis    Convulsions/seizures (HCC) 01/28/2013   Dyslipidemia    GERD (gastroesophageal reflux disease)    Hyperlipidemia    Hypertension    Kidney transplanted    Peripheral vascular disease (HCC)    Seizure disorder (HCC)    Stroke (HCC)    "possible mini stroke" years ago per patient- patient no longer sees neurologist   Past Surgical History:  Procedure Laterality Date   A/V FISTULAGRAM N/A 03/15/2023   Procedure: A/V Fistulagram;  Surgeon: Patrick Boor, MD;  Location: [facility];  Service: Cardiovascular;  Laterality: N/A;   ABDOMINAL AORTOGRAM W/LOWER EXTREMITY Left 04/25/2023   Procedure: ABDOMINAL AORTOGRAM W/LOWER EXTREMITY;  Surgeon: Young Hensen, MD;  Location: [facility];  Service: Cardiovascular;  Laterality: Left;   CATHETER REMOVAL     COLONOSCOPY     GALLBLADDER SURGERY     INSERTION OF DIALYSIS CATHETER Left 08/11/2021   Procedure: INSERTION LEFT INTERNAL JUGULAR TUNNELED DIALYSIS CATHETER;  Surgeon: Young Hensen, MD;  Location: [facility] OR;  Service: Vascular;  Laterality: Left;   KIDNEY TRANSPLANT     04/10/2009   PERIPHERAL VASCULAR BALLOON ANGIOPLASTY Left 04/25/2023   Procedure: PERIPHERAL VASCULAR BALLOON ANGIOPLASTY;  Surgeon: Young Hensen, MD;  Location: [facility];  Service: Cardiovascular;  Laterality: Left;   REVISON OF ARTERIOVENOUS FISTULA Right 08/11/2021   Procedure: RIGHT ARM ARTERIOVENOUS FISTULA REVISION WITH ANEURYSM REMOVAL;  Surgeon: Young Hensen, MD;  Location: [facility] OR;  Service: Vascular;  Laterality: Right;   Patient Active Problem List   Diagnosis Date Noted   Volume overload 07/15/2023   Critical limb ischemia of left lower  extremity (HCC) 04/16/2023   Malnutrition of moderate degree 03/01/2022   Community acquired pneumonia of left lung 02/27/2022   ESRD on hemodialysis (HCC) 08/04/2021   Hypertension 06/15/2021   Dyslipidemia 06/15/2021   Renal transplant recipient 08/13/2015   History of seizure 01/28/2013    PCP: Leandra Pro, MD  REFERRING PROVIDER: Oral Billings, MD  REFERRING DIAG: Weakness [R53.1]   Rationale for Evaluation and Treatment: Rehabilitation  THERAPY DIAG:  No diagnosis found.  PERTINENT HISTORY: Hx of seizure, HTN, ESRD with dialysis, hx of stroke  WEIGHT BEARING RESTRICTIONS: No  FALLS:  Has patient fallen in last 6 months? No  LIVING ENVIRONMENT: Lives with: lives alone Lives in: House/apartment Stairs: No Has following equipment at home: Single point cane  OCCUPATION: retired   PRECAUTIONS: None ---------------------------------------------------------------------------------------------  SUBJECTIVE:  SUBJECTIVE STATEMENT: Feels like she needs to strengthen her legs, walks slower than her family during vacations. Occasionally has to take rest breaks when walking, has the most pain in her knees, 6/10 pain on a bad day. Has the most energy the days after dialysis. Dialysis is done on Tues, Thurs, and Saturday  Pt accompanied by: self  RED FLAGS: None   PLOF: Independent  PATIENT GOALS: get stronger ---------------------------------------------------------------------------------------------  OBJECTIVE:  Note: Objective measures were completed at Evaluation unless otherwise noted. COGNITION: Overall cognitive status: Within functional limits for tasks assessed   SENSATION: WFL  COORDINATION: WFL  POSTURE: rounded shoulders and forward head  LOWER EXTREMITY  ROM:     Passive  Right Eval Left Eval  Hip flexion    Hip extension    Hip abduction    Hip adduction    Hip internal rotation    Hip external rotation    Knee flexion    Knee extension    Ankle dorsiflexion    Ankle plantarflexion    Ankle inversion    Ankle eversion     (Blank rows = not tested)  LOWER EXTREMITY MMT:    MMT Right Eval Left Eval  Hip flexion 4+ 4-  Hip extension 4 4  Hip abduction 4 4  Hip adduction    Hip internal rotation    Hip external rotation    Knee flexion 4- 4-  Knee extension 4- 4-  Ankle dorsiflexion    Ankle plantarflexion    Ankle inversion    Ankle eversion    (Blank rows = not tested)  BED MOBILITY:  Sit to supine Complete Independence Supine to sit Complete Independence Rolling to Right Complete Independence Rolling to Left Complete Independence  TRANSFERS: Assistive device utilized: None  Sit to stand: Complete Independence Stand to sit: Complete Independence Chair to chair: Modified independence  STAIRS: Level of Assistance: Min A Stair Negotiation Technique: Step to Pattern with Bilateral Rails Number of Stairs: 4   GAIT: Gait pattern: decreased stance time- Right, decreased stride length, decreased hip/knee flexion- Right, decreased ankle dorsiflexion- Right, and decreased ankle dorsiflexion- Left Distance walked: 214ft Assistive device utilized: None Level of assistance: SBA Comments: weight shift right  FUNCTIONAL TESTS:  30 seconds chair stand test: 5 reps 2 minute walk test: 195 feet, (d/c at 1:30 d/t pt fatigue)  PATIENT SURVEYS:  Patient-specific activity scoring scheme (Point to one number): (unable) 0-10 (able at or beyond PLOF)  Activity Initial 1.Stooping :3 2.Climbing stairs: 5 3.Walking long distances: 3    sum of the activity scores/number of activities; Total score =11  Minimum detectable change (90%CI) for average score = 2 points Minimum detectable change (90%CI) for single activity  score = 3 points    Baseline vitals: BP: 130/60 MmHg HR: 80 bpm SPO2: 97% Respirations: 18  [facility] Adult PT Treatment:                                                DATE: 07/31/2023 Self Care: POC discussion Pt education    PATIENT EDUCATION: Education details:Pt received education regarding HEP performance, ADL performance, functional activity tolerance, impairment education, appropriate performance of therapeutic activities.  Person educated: Patient Education method: Explanation, Demonstration, Tactile cues, Verbal cues, and Handouts Education comprehension: verbalized understanding and returned demonstration  HOME EXERCISE PROGRAM: TBD next visit, want to review safe performance prior to HEP administration.  ---------------------------------------------------------------------------------------------  ASSESSMENT:  CLINICAL IMPRESSION: Eval impression (07/31/2023): Pt. attended today's physical therapy session for evaluation of generalized weakness. Pt has complaints of difficulties with stooping, walking long distances and climbing stairs. Pt has notable deficits with BLE weakness, gait quality, gait/activity tolerance.   Pt would benefit from therapeutic focus on global BLE strengthening, functional technique and strengthening of transfers, gait quality practice, and gradual progression of activity tolerance.  Treatment performed today focused on pt education detailed in obj. Pt demonstrated good understanding of education provided. required minimal verbal/tactile cues and min assistance for appropriate performance with today's activities. Pt requires the intervention of skilled outpatient physical therapy to address the aforementioned deficits and progress towards a functional level in line with therapeutic goals.   OBJECTIVE IMPAIRMENTS: Abnormal gait,  cardiopulmonary status limiting activity, decreased activity tolerance, decreased balance, decreased knowledge of use of DME, difficulty walking, decreased strength, decreased safety awareness, improper body mechanics, and pain.   ACTIVITY LIMITATIONS: carrying, bending, squatting, stairs, transfers, and locomotion level  PARTICIPATION LIMITATIONS: cleaning, laundry, interpersonal relationship, community activity, and church  PERSONAL FACTORS: Age, Behavior pattern, Fitness, and 3+ comorbidities: previous stroke, HTN, and ESRD are also affecting patient's functional outcome.   REHAB POTENTIAL: Fair    CLINICAL DECISION MAKING: Stable/uncomplicated  EVALUATION COMPLEXITY: Moderate --------------------------------------------------------------------------------------------- GOALS: Goals reviewed with patient? Yes  SHORT TERM GOALS: Target date: 08/21/2023  Pt will be independent with administered HEP to demonstrate the competency necessary for long term managemnet of symptoms at home. Baseline: Goal status: INITIAL  LONG TERM GOALS: Target date: 09/11/2023   Pt will independently ambulate 449ft with no AD or LOB to demonstrate improved weightbearing tolerance, BLE strength, and functional capacity for community ambulation. Baseline: 231ft Goal status: INITIAL  2.  Pt will improve 30s STS to 7 reps to demonstrate improved functional strength with transfers. Baseline: 5 Goal status: INITIAL  3.  Pt will tolerate ( and ambulate at least 279ft with independence and no LOB to demonstrate improved capacity for prolonged gait in the community. Baseline: d/c test at 1:30 Goal status: INITIAL  4.  Pt will improve global BLE strength to 4+/5 to dmeosntrate strength necessary for high quliaty movement during ADLs and functional movements such as stooping Baseline: see obj chart Goal status: INITIAL  5.  Pt will improve PSFS to 13 with initial 3 activities to demonstrate improved  perceived function with common daily activities. Baseline: 11 Goal status: INITIAL   PLAN:  PT FREQUENCY: 1-2x/week  PT DURATION: 6 weeks  PLANNED INTERVENTIONS: 97110-Therapeutic exercises, 97530- Therapeutic activity, [id number]- Neuromuscular re-education, 97535- Self Care, 40981- Manual therapy, [phone]- Gait training, [phone]- Electrical stimulation (manual), Patient/Family education, Balance training, Stair training, and DME instructions  PLAN FOR NEXT SESSION: Review HEP, Continue with POC as detailed in assessment.  Albesa Huguenin, PT, DPT 08/14/2023, 1:31 PM  For all possible CPT codes, reference the Planned Interventions line above.     Check all conditions that are expected to impact treatment: {Conditions expected to impact treatment:Ongoing dialysis or cancer treatment   If treatment provided at initial evaluation, no treatment charged due to lack of authorization.

## 2023-08-14 NOTE — Therapy (Signed)
 OUTPATIENT PHYSICAL THERAPY TREATMENT NOTE   Patient Name: Karen [name] MRN: [id number] DOB:12/19/1955, 68 y.o., female Today's Date: 08/14/2023  END OF SESSION:  PT End of Session - 08/14/23 1529     Visit Number 2    Number of Visits 13    Date for PT Re-Evaluation 09/11/23    PT Start Time 1530    PT Stop Time 1608    PT Time Calculation (min) 38 min    Activity Tolerance Patient limited by fatigue    Behavior During Therapy [facility] for tasks assessed/performed              Past Medical History:  Diagnosis Date   Anemia associated with chronic renal failure    Arthritis    Convulsions/seizures (HCC) 01/28/2013   Dyslipidemia    GERD (gastroesophageal reflux disease)    Hyperlipidemia    Hypertension    Kidney transplanted    Peripheral vascular disease (HCC)    Seizure disorder (HCC)    Stroke (HCC)    "possible mini stroke" years ago per patient- patient no longer sees neurologist   Past Surgical History:  Procedure Laterality Date   A/V FISTULAGRAM N/A 03/15/2023   Procedure: A/V Fistulagram;  Surgeon: Patrick Boor, MD;  Location: [facility];  Service: Cardiovascular;  Laterality: N/A;   ABDOMINAL AORTOGRAM W/LOWER EXTREMITY Left 04/25/2023   Procedure: ABDOMINAL AORTOGRAM W/LOWER EXTREMITY;  Surgeon: Young Hensen, MD;  Location: [facility];  Service: Cardiovascular;  Laterality: Left;   CATHETER REMOVAL     COLONOSCOPY     GALLBLADDER SURGERY     INSERTION OF DIALYSIS CATHETER Left 08/11/2021   Procedure: INSERTION LEFT INTERNAL JUGULAR TUNNELED DIALYSIS CATHETER;  Surgeon: Young Hensen, MD;  Location: [facility] OR;  Service: Vascular;  Laterality: Left;   KIDNEY TRANSPLANT     04/10/2009   PERIPHERAL VASCULAR BALLOON ANGIOPLASTY Left 04/25/2023   Procedure: PERIPHERAL VASCULAR BALLOON ANGIOPLASTY;  Surgeon: Young Hensen, MD;  Location: [facility];  Service: Cardiovascular;  Laterality: Left;   REVISON OF  ARTERIOVENOUS FISTULA Right 08/11/2021   Procedure: RIGHT ARM ARTERIOVENOUS FISTULA REVISION WITH ANEURYSM REMOVAL;  Surgeon: Young Hensen, MD;  Location: [facility] OR;  Service: Vascular;  Laterality: Right;   Patient Active Problem List   Diagnosis Date Noted   Volume overload 07/15/2023   Critical limb ischemia of left lower extremity (HCC) 04/16/2023   Malnutrition of moderate degree 03/01/2022   Community acquired pneumonia of left lung 02/27/2022   ESRD on hemodialysis (HCC) 08/04/2021   Hypertension 06/15/2021   Dyslipidemia 06/15/2021   Renal transplant recipient 08/13/2015   History of seizure 01/28/2013    PCP: Leandra Pro, MD  REFERRING PROVIDER: Oral Billings, MD  REFERRING DIAG: Weakness [R53.1]   Rationale for Evaluation and Treatment: Rehabilitation  THERAPY DIAG:  Muscle weakness (generalized)  Other abnormalities of gait and mobility  PERTINENT HISTORY: Hx of seizure, HTN, ESRD with dialysis, hx of stroke  WEIGHT BEARING RESTRICTIONS: No  FALLS:  Has patient fallen in last 6 months? No  LIVING ENVIRONMENT: Lives with: lives alone Lives in: House/apartment Stairs: No Has following equipment at home: Single point cane  OCCUPATION: retired   PRECAUTIONS: None ---------------------------------------------------------------------------------------------  SUBJECTIVE:  SUBJECTIVE STATEMENT: Pt attended today's session with reports of 0/10 pain. Feeling good today, just had dialysis yesterday.   RED FLAGS: None   PLOF: Independent  PATIENT GOALS: get stronger ---------------------------------------------------------------------------------------------  OBJECTIVE:  Note: Objective measures were completed at Evaluation unless otherwise  noted. COGNITION: Overall cognitive status: Within functional limits for tasks assessed   SENSATION: WFL  COORDINATION: WFL  POSTURE: rounded shoulders and forward head  LOWER EXTREMITY ROM:     Passive  Right Eval Left Eval  Hip flexion    Hip extension    Hip abduction    Hip adduction    Hip internal rotation    Hip external rotation    Knee flexion    Knee extension    Ankle dorsiflexion    Ankle plantarflexion    Ankle inversion    Ankle eversion     (Blank rows = not tested)  LOWER EXTREMITY MMT:    MMT Right Eval Left Eval  Hip flexion 4+ 4-  Hip extension 4 4  Hip abduction 4 4  Hip adduction    Hip internal rotation    Hip external rotation    Knee flexion 4- 4-  Knee extension 4- 4-  Ankle dorsiflexion    Ankle plantarflexion    Ankle inversion    Ankle eversion    (Blank rows = not tested)  BED MOBILITY:  Sit to supine Complete Independence Supine to sit Complete Independence Rolling to Right Complete Independence Rolling to Left Complete Independence  TRANSFERS: Assistive device utilized: None  Sit to stand: Complete Independence Stand to sit: Complete Independence Chair to chair: Modified independence  STAIRS: Level of Assistance: Min A Stair Negotiation Technique: Step to Pattern with Bilateral Rails Number of Stairs: 4   GAIT: Gait pattern: decreased stance time- Right, decreased stride length, decreased hip/knee flexion- Right, decreased ankle dorsiflexion- Right, and decreased ankle dorsiflexion- Left Distance walked: 234ft Assistive device utilized: None Level of assistance: SBA Comments: weight shift right  FUNCTIONAL TESTS:  30 seconds chair stand test: 5 reps 2 minute walk test: 195 feet, (d/c at 1:30 d/t pt fatigue)  PATIENT SURVEYS:  Patient-specific activity scoring scheme (Point to one number): (unable) 0-10 (able at or beyond PLOF)  Activity Initial 1.Stooping :3 2.Climbing stairs: 5 3.Walking long  distances: 3    sum of the activity scores/number of activities; Total score =11  Minimum detectable change (90%CI) for average score = 2 points Minimum detectable change (90%CI) for single activity score = 3 points    Baseline vitals: BP: 130/60 MmHg HR: 80 bpm SPO2: 97% Respirations: 18  [facility] Adult PT Treatment:                                                DATE: 08/14/2023 Therapeutic Activity: NuStep 8' for activity tolerance STS  2x12, GTB for knee valgus cue Standing hip ext/abd 2x10 Forward/lat step up 2x10, lead w/LLE HEP review and update  [facility] Adult PT Treatment:                                                DATE: 07/31/2023 Self Care: POC discussion Pt education    PATIENT EDUCATION: Education details:Pt received education regarding HEP performance, ADL performance, functional activity tolerance, impairment education, appropriate performance of therapeutic activities.  Person educated: Patient Education method: Explanation, Demonstration, Tactile cues, Verbal cues, and Handouts Education comprehension: verbalized understanding and returned demonstration  HOME EXERCISE PROGRAM: Access Code: [email or web address] URL: [email or web address] Date: 08/14/2023 Prepared by: Albesa Huguenin  Exercises - Walking  - 2 x daily - 7 x weekly - 1 sets - 1 reps - 94m hold - Sit to Stand Without Arm Support  - 1 x daily - 7 x weekly - 2-3 sets - 12 reps - Standing Hip Abduction with Counter Support  - 1 x daily - 7 x weekly - 3 sets - 10 reps - 2 hold - Standing Hip Extension with Counter Support  - 1 x daily - 7 x weekly - 3 sets - 10 reps - 2 hold  ---------------------------------------------------------------------------------------------  ASSESSMENT:  CLINICAL IMPRESSION: Pt attended physical therapy session for continuation of treatment  regarding generalized weakness and activity tolerance. Today's treatment focused on HEP education/performance, improvement of  functional hip strength, stair ambulation quality, and activity tolerance. Pt showed  great tolerance to administered treatment with no adverse effects by the end of session. Skilled intervention was utilized via activity modification for pt tolerance with task completion, functional progression/regression promoting best outcomes inline with current rehab goals, as well as moderate verbal/tactile cuing alongside no physical assistance for safe and appropriate performance of today's activities. Continue with therapeutic focus on activity tolerance, stair/ambulation quality, and functional hip strength/endurance.    Eval impression (07/31/2023): Pt. attended today's physical therapy session for evaluation of generalized weakness. Pt has complaints of difficulties with stooping, walking long distances and climbing stairs. Pt has notable deficits with BLE weakness, gait quality, gait/activity tolerance.   Pt would benefit from therapeutic focus on global BLE strengthening, functional technique and strengthening of transfers, gait quality practice, and gradual progression of activity tolerance.  Treatment performed today focused on pt education detailed in obj. Pt demonstrated good understanding of education provided. required minimal verbal/tactile cues and min assistance for appropriate performance with today's activities. Pt requires the intervention of skilled outpatient physical therapy to address the aforementioned deficits and progress towards a functional level in line with therapeutic goals.   OBJECTIVE IMPAIRMENTS: Abnormal gait, cardiopulmonary status limiting activity, decreased activity tolerance, decreased balance, decreased knowledge of use of DME, difficulty walking, decreased strength, decreased safety awareness, improper body mechanics, and pain.   ACTIVITY LIMITATIONS:  carrying, bending, squatting, stairs, transfers, and locomotion level  PARTICIPATION LIMITATIONS: cleaning, laundry, interpersonal relationship, community activity, and church  PERSONAL FACTORS: Age, Behavior pattern, Fitness, and 3+ comorbidities: previous stroke, HTN, and ESRD are also affecting patient's functional outcome.   REHAB POTENTIAL: Fair    CLINICAL DECISION MAKING: Stable/uncomplicated  EVALUATION COMPLEXITY: Moderate --------------------------------------------------------------------------------------------- GOALS: Goals reviewed with patient? Yes  SHORT TERM GOALS: Target date: 08/21/2023  Pt will be independent with administered HEP to demonstrate the competency necessary for long term managemnet of symptoms at home. Baseline: Goal status: INITIAL  LONG TERM GOALS: Target date: 09/11/2023   Pt will independently ambulate 440ft with no AD or  LOB to demonstrate improved weightbearing tolerance, BLE strength, and functional capacity for community ambulation. Baseline: 228ft Goal status: INITIAL  2.  Pt will improve 30s STS to 7 reps to demonstrate improved functional strength with transfers. Baseline: 5 Goal status: INITIAL  3.  Pt will tolerate ( and ambulate at least 2109ft with independence and no LOB to demonstrate improved capacity for prolonged gait in the community. Baseline: d/c test at 1:30 Goal status: INITIAL  4.  Pt will improve global BLE strength to 4+/5 to dmeosntrate strength necessary for high quliaty movement during ADLs and functional movements such as stooping Baseline: see obj chart Goal status: INITIAL  5.  Pt will improve PSFS to 13 with initial 3 activities to demonstrate improved perceived function with common daily activities. Baseline: 11 Goal status: INITIAL   PLAN:  PT FREQUENCY: 1-2x/week  PT DURATION: 6 weeks  PLANNED INTERVENTIONS: 97110-Therapeutic exercises, 97530- Therapeutic activity, [id number]- Neuromuscular  re-education, 97535- Self Care, 16109- Manual therapy, [phone]- Gait training, [phone]- Electrical stimulation (manual), Patient/Family education, Balance training, Stair training, and DME instructions  PLAN FOR NEXT SESSION: Continue with therapeutic focus on activity tolerance, stair/ambulation quality, and functional hip strength/endurance. F/u with HEP next session   Albesa Huguenin, PT, DPT 08/14/2023, 4:08 PM  For all possible CPT codes, reference the Planned Interventions line above.     Check all conditions that are expected to impact treatment: {Conditions expected to impact treatment:Ongoing dialysis or cancer treatment   If treatment provided at initial evaluation, no treatment charged due to lack of authorization.

## 2023-08-15 NOTE — Therapy (Unsigned)
 OUTPATIENT PHYSICAL THERAPY TREATMENT NOTE   Patient Name: Karen [name] MRN: [id number] DOB:1955/12/17, 68 y.o., female Today's Date: 08/16/2023  END OF SESSION:  PT End of Session - 08/16/23 1259     Visit Number 3    Number of Visits 13    Date for PT Re-Evaluation 09/11/23    Progress Note Due on Visit 10    PT Start Time 1300    PT Stop Time 1338    PT Time Calculation (min) 38 min    Activity Tolerance Patient limited by fatigue    Behavior During Therapy [facility] for tasks assessed/performed               Past Medical History:  Diagnosis Date   Anemia associated with chronic renal failure    Arthritis    Convulsions/seizures (HCC) 01/28/2013   Dyslipidemia    GERD (gastroesophageal reflux disease)    Hyperlipidemia    Hypertension    Kidney transplanted    Peripheral vascular disease (HCC)    Seizure disorder (HCC)    Stroke (HCC)    "possible mini stroke" years ago per patient- patient no longer sees neurologist   Past Surgical History:  Procedure Laterality Date   A/V FISTULAGRAM N/A 03/15/2023   Procedure: A/V Fistulagram;  Surgeon: Patrick Boor, MD;  Location: [facility] INVAS[facility] CV [facility];  Service: Cardiovascular;  Laterality: N/A;   ABDOMINAL AORTOGRAM W/LOWER EXTREMITY Left 04/25/2023   Procedure: ABDOMINAL AORTOGRAM W/LOWER EXTREMITY;  Surgeon: Young Hensen, MD;  Location: [facility] INV[facility] CV [facility];  Service: Cardiovascular;  Laterality: Left;   CATHETER REMOVAL     COLONOSCOPY     GALLBLADDER SURGERY     INSERTION OF DIALYSIS CATHETER Left 08/11/2021   Procedure: INSERTION LEFT INTERNAL JUGULAR TUNNELED DIALYSIS CATHETER;  Surgeon: Young Hensen, MD;  Location: [facility] OR;  Service: Vascular;  Laterality: Left;   KIDNEY TRANSPLANT     04/10/2009   PERIPHERAL VASCULAR BALLOON ANGIOPLASTY Left 04/25/2023   Procedure: PERIPHERAL VASCULAR BALLOON ANGIOPLASTY;  Surgeon: Young Hensen, MD;  Location: [facility];  Service: Cardiovascular;   Laterality: Left;   REVISON OF ARTERIOVENOUS FISTULA Right 08/11/2021   Procedure: RIGHT ARM ARTERIOVENOUS FISTULA REVISION WITH ANEURYSM REMOVAL;  Surgeon: Young Hensen, MD;  Location: [facility] OR;  Service: Vascular;  Laterality: Right;   Patient Active Problem List   Diagnosis Date Noted   Volume overload 07/15/2023   Critical limb ischemia of left lower extremity (HCC) 04/16/2023   Malnutrition of moderate degree 03/01/2022   Community acquired pneumonia of left lung 02/27/2022   ESRD on hemodialysis (HCC) 08/04/2021   Hypertension 06/15/2021   Dyslipidemia 06/15/2021   Renal transplant recipient 08/13/2015   History of seizure 01/28/2013    PCP: Leandra Pro, MD  REFERRING PROVIDER: Oral Billings, MD  REFERRING DIAG: Weakness [R53.1]   Rationale for Evaluation and Treatment: Rehabilitation  THERAPY DIAG:  Muscle weakness (generalized)  Other abnormalities of gait and mobility  PERTINENT HISTORY: Hx of seizure, HTN, ESRD with dialysis, hx of stroke  WEIGHT BEARING RESTRICTIONS: No  FALLS:  Has patient fallen in last 6 months? No  LIVING ENVIRONMENT: Lives with: lives alone Lives in: House/apartment Stairs: No Has following equipment at home: Single point cane  OCCUPATION: retired   PRECAUTIONS: None ---------------------------------------------------------------------------------------------  SUBJECTIVE:  SUBJECTIVE STATEMENT: Pt attended today's session with reports of 0/10 pain. Feeling good today, just had dialysis yesterday.   RED FLAGS: None   PLOF: Independent  PATIENT GOALS: get stronger ---------------------------------------------------------------------------------------------  OBJECTIVE:  Note: Objective measures were completed at Evaluation  unless otherwise noted. COGNITION: Overall cognitive status: Within functional limits for tasks assessed   SENSATION: WFL  COORDINATION: WFL  POSTURE: rounded shoulders and forward head  LOWER EXTREMITY ROM:     Passive  Right Eval Left Eval  Hip flexion    Hip extension    Hip abduction    Hip adduction    Hip internal rotation    Hip external rotation    Knee flexion    Knee extension    Ankle dorsiflexion    Ankle plantarflexion    Ankle inversion    Ankle eversion     (Blank rows = not tested)  LOWER EXTREMITY MMT:    MMT Right Eval Left Eval  Hip flexion 4+ 4-  Hip extension 4 4  Hip abduction 4 4  Hip adduction    Hip internal rotation    Hip external rotation    Knee flexion 4- 4-  Knee extension 4- 4-  Ankle dorsiflexion    Ankle plantarflexion    Ankle inversion    Ankle eversion    (Blank rows = not tested)  BED MOBILITY:  Sit to supine Complete Independence Supine to sit Complete Independence Rolling to Right Complete Independence Rolling to Left Complete Independence  TRANSFERS: Assistive device utilized: None  Sit to stand: Complete Independence Stand to sit: Complete Independence Chair to chair: Modified independence  STAIRS: Level of Assistance: Min A Stair Negotiation Technique: Step to Pattern with Bilateral Rails Number of Stairs: 4   GAIT: Gait pattern: decreased stance time- Right, decreased stride length, decreased hip/knee flexion- Right, decreased ankle dorsiflexion- Right, and decreased ankle dorsiflexion- Left Distance walked: 29ft Assistive device utilized: None Level of assistance: SBA Comments: weight shift right  FUNCTIONAL TESTS:  30 seconds chair stand test: 5 reps 2 minute walk test: 195 feet, (d/c at 1:30 d/t pt fatigue)  PATIENT SURVEYS:  Patient-specific activity scoring scheme (Point to one number): (unable) 0-10 (able at or beyond PLOF)  Activity Initial 1.Stooping :3 2.Climbing stairs:  5 3.Walking long distances: 3    sum of the activity scores/number of activities; Total score =11  Minimum detectable change (90%CI) for average score = 2 points Minimum detectable change (90%CI) for single activity score = 3 points    Baseline vitals: BP: 130/60 MmHg HR: 80 bpm SPO2: 97% Respirations: 18  [facility] Adult PT Treatment:                                                DATE: 08/16/23 Therapeutic Exercise: Nustep L3 8 min Neuromuscular re-ed: STS from airex 10x no UE Support FAQs w/adduction 15x Standing heel/toe with UE support 10x Therapeutic Activity: Supine hip fallouts GTB 10x B, 10/10 unilaterally S/L clams GTB 10/10 Bridge against GTB 10x Standing hamstring curls 10/10  [facility] Adult PT Treatment:                                                DATE: 08/14/2023 Therapeutic  Activity: NuStep 8' for activity tolerance STS  2x12, GTB for knee valgus cue Standing hip ext/abd 2x10 Forward/lat step up 2x10, lead w/LLE HEP review and update                                                                                                                             [facility] Adult PT Treatment:                                                DATE: 07/31/2023 Self Care: POC discussion Pt education    PATIENT EDUCATION: Education details:Pt received education regarding HEP performance, ADL performance, functional activity tolerance, impairment education, appropriate performance of therapeutic activities.  Person educated: Patient Education method: Explanation, Demonstration, Tactile cues, Verbal cues, and Handouts Education comprehension: verbalized understanding and returned demonstration  HOME EXERCISE PROGRAM: Access Code: [email or web address] URL: [email or web address] Date: 08/14/2023 Prepared by: Albesa Huguenin  Exercises - Walking  - 2 x daily - 7 x weekly - 1 sets - 1 reps - 70m hold - Sit to Stand Without Arm Support  - 1 x daily - 7 x weekly - 2-3 sets -  12 reps - Standing Hip Abduction with Counter Support  - 1 x daily - 7 x weekly - 3 sets - 10 reps - 2 hold - Standing Hip Extension with Counter Support  - 1 x daily - 7 x weekly - 3 sets - 10 reps - 2 hold  ---------------------------------------------------------------------------------------------  ASSESSMENT:  CLINICAL IMPRESSION: Today's focus was aerobic work followed by strength training of large LE muscle groups to challenge patient endurance and stamina.  Introduced STS from compliant surface to challenge balance and proprioception.  Patient able to complete all requested tasks with onlymild fatigue reported   Pt attended physical therapy session for continuation of treatment regarding generalized weakness and activity tolerance. Today's treatment focused on HEP education/performance, improvement of  functional hip strength, stair ambulation quality, and activity tolerance. Pt showed  great tolerance to administered treatment with no adverse effects by the end of session. Skilled intervention was utilized via activity modification for pt tolerance with task completion, functional progression/regression promoting best outcomes inline with current rehab goals, as well as moderate verbal/tactile cuing alongside no physical assistance for safe and appropriate performance of today's activities. Continue with therapeutic focus on activity tolerance, stair/ambulation quality, and functional hip strength/endurance.    Eval impression (07/31/2023): Pt. attended today's physical therapy session for evaluation of generalized weakness. Pt has complaints of difficulties with stooping, walking long distances and climbing stairs. Pt has notable deficits with BLE weakness, gait quality, gait/activity tolerance.   Pt would benefit from therapeutic focus on global BLE strengthening, functional technique and strengthening of transfers, gait quality practice, and gradual progression of activity tolerance.   Treatment performed today focused on pt education detailed  in obj. Pt demonstrated good understanding of education provided. required minimal verbal/tactile cues and min assistance for appropriate performance with today's activities. Pt requires the intervention of skilled outpatient physical therapy to address the aforementioned deficits and progress towards a functional level in line with therapeutic goals.   OBJECTIVE IMPAIRMENTS: Abnormal gait, cardiopulmonary status limiting activity, decreased activity tolerance, decreased balance, decreased knowledge of use of DME, difficulty walking, decreased strength, decreased safety awareness, improper body mechanics, and pain.   ACTIVITY LIMITATIONS: carrying, bending, squatting, stairs, transfers, and locomotion level  PARTICIPATION LIMITATIONS: cleaning, laundry, interpersonal relationship, community activity, and church  PERSONAL FACTORS: Age, Behavior pattern, Fitness, and 3+ comorbidities: previous stroke, HTN, and ESRD are also affecting patient's functional outcome.   REHAB POTENTIAL: Fair    CLINICAL DECISION MAKING: Stable/uncomplicated  EVALUATION COMPLEXITY: Moderate --------------------------------------------------------------------------------------------- GOALS: Goals reviewed with patient? Yes  SHORT TERM GOALS: Target date: 08/21/2023  Pt will be independent with administered HEP to demonstrate the competency necessary for long term managemnet of symptoms at home. Baseline: Goal status: INITIAL  LONG TERM GOALS: Target date: 09/11/2023   Pt will independently ambulate 494ft with no AD or LOB to demonstrate improved weightbearing tolerance, BLE strength, and functional capacity for community ambulation. Baseline: 285ft Goal status: INITIAL  2.  Pt will improve 30s STS to 7 reps to demonstrate improved functional strength with transfers. Baseline: 5 Goal status: INITIAL  3.  Pt will tolerate ( and ambulate at least  240ft with independence and no LOB to demonstrate improved capacity for prolonged gait in the community. Baseline: d/c test at 1:30 Goal status: INITIAL  4.  Pt will improve global BLE strength to 4+/5 to dmeosntrate strength necessary for high quliaty movement during ADLs and functional movements such as stooping Baseline: see obj chart Goal status: INITIAL  5.  Pt will improve PSFS to 13 with initial 3 activities to demonstrate improved perceived function with common daily activities. Baseline: 11 Goal status: INITIAL   PLAN:  PT FREQUENCY: 1-2x/week  PT DURATION: 6 weeks  PLANNED INTERVENTIONS: 97110-Therapeutic exercises, 97530- Therapeutic activity, [id number]- Neuromuscular re-education, 97535- Self Care, [id number]- Manual therapy, [phone]- Gait training, [phone]- Electrical stimulation (manual), Patient/Family education, Balance training, Stair training, and DME instructions  PLAN FOR NEXT SESSION: Continue with therapeutic focus on activity tolerance, stair/ambulation quality, and functional hip strength/endurance. F/u with HEP next session   Albesa Huguenin, PT, DPT 08/16/2023, 1:38 PM  For all possible CPT codes, reference the Planned Interventions line above.     Check all conditions that are expected to impact treatment: {Conditions expected to impact treatment:Ongoing dialysis or cancer treatment   If treatment provided at initial evaluation, no treatment charged due to lack of authorization.

## 2023-08-16 ENCOUNTER — Ambulatory Visit

## 2023-08-16 DIAGNOSIS — M6281 Muscle weakness (generalized): Secondary | ICD-10-CM

## 2023-08-16 DIAGNOSIS — R2689 Other abnormalities of gait and mobility: Secondary | ICD-10-CM

## 2023-08-19 ENCOUNTER — Ambulatory Visit

## 2023-08-19 DIAGNOSIS — M6281 Muscle weakness (generalized): Secondary | ICD-10-CM | POA: Diagnosis not present

## 2023-08-19 DIAGNOSIS — R2689 Other abnormalities of gait and mobility: Secondary | ICD-10-CM

## 2023-08-19 NOTE — Therapy (Signed)
 OUTPATIENT PHYSICAL THERAPY TREATMENT NOTE   Patient Name: Karen [name] MRN: [id number] DOB:[birth year], 68 y.o., female Today's Date: 08/19/2023  END OF SESSION:  PT End of Session - 08/19/23 1356     Visit Number 4    Number of Visits 13    Date for PT Re-Evaluation 09/11/23    Authorization Type MCR    Progress Note Due on Visit 10    PT Start Time 1400    PT Stop Time 1438    PT Time Calculation (min) 38 min    Activity Tolerance Patient limited by fatigue    Behavior During Therapy [facility] for tasks assessed/performed                Past Medical History:  Diagnosis Date   Anemia associated with chronic renal failure    Arthritis    Convulsions/seizures (HCC) 01/28/2013   Dyslipidemia    GERD (gastroesophageal reflux disease)    Hyperlipidemia    Hypertension    Kidney transplanted    Peripheral vascular disease (HCC)    Seizure disorder (HCC)    Stroke (HCC)    "possible mini stroke" years ago per patient- patient no longer sees neurologist   Past Surgical History:  Procedure Laterality Date   A/V FISTULAGRAM N/A 03/15/2023   Procedure: A/V Fistulagram;  Surgeon: Patrick Boor, MD;  Location: [facility];  Service: Cardiovascular;  Laterality: N/A;   ABDOMINAL AORTOGRAM W/LOWER EXTREMITY Left 04/25/2023   Procedure: ABDOMINAL AORTOGRAM W/LOWER EXTREMITY;  Surgeon: Young Hensen, MD;  Location: [facility];  Service: Cardiovascular;  Laterality: Left;   CATHETER REMOVAL     COLONOSCOPY     GALLBLADDER SURGERY     INSERTION OF DIALYSIS CATHETER Left 08/11/2021   Procedure: INSERTION LEFT INTERNAL JUGULAR TUNNELED DIALYSIS CATHETER;  Surgeon: Young Hensen, MD;  Location: [facility] OR;  Service: Vascular;  Laterality: Left;   KIDNEY TRANSPLANT     04/10/2009   PERIPHERAL VASCULAR BALLOON ANGIOPLASTY Left 04/25/2023   Procedure: PERIPHERAL VASCULAR BALLOON ANGIOPLASTY;  Surgeon: Young Hensen, MD;  Location: [facility];   Service: Cardiovascular;  Laterality: Left;   REVISON OF ARTERIOVENOUS FISTULA Right 08/11/2021   Procedure: RIGHT ARM ARTERIOVENOUS FISTULA REVISION WITH ANEURYSM REMOVAL;  Surgeon: Young Hensen, MD;  Location: [facility] OR;  Service: Vascular;  Laterality: Right;   Patient Active Problem List   Diagnosis Date Noted   Volume overload 07/15/2023   Critical limb ischemia of left lower extremity (HCC) 04/16/2023   Malnutrition of moderate degree 03/01/2022   Community acquired pneumonia of left lung 02/27/2022   ESRD on hemodialysis (HCC) 08/04/2021   Hypertension 06/15/2021   Dyslipidemia 06/15/2021   Renal transplant recipient 08/13/2015   History of seizure 01/28/2013    PCP: Leandra Pro, MD  REFERRING PROVIDER: Oral Billings, MD  REFERRING DIAG: Weakness [R53.1]   Rationale for Evaluation and Treatment: Rehabilitation  THERAPY DIAG:  Muscle weakness (generalized)  Other abnormalities of gait and mobility  PERTINENT HISTORY: Hx of seizure, HTN, ESRD with dialysis, hx of stroke  WEIGHT BEARING RESTRICTIONS: No  FALLS:  Has patient fallen in last 6 months? No  LIVING ENVIRONMENT: Lives with: lives alone Lives in: House/apartment Stairs: No Has following equipment at home: Single point cane  OCCUPATION: retired   PRECAUTIONS: None ---------------------------------------------------------------------------------------------  SUBJECTIVE:  SUBJECTIVE STATEMENT: No pain reported    RED FLAGS: None   PLOF: Independent  PATIENT GOALS: get stronger ---------------------------------------------------------------------------------------------  OBJECTIVE:  Note: Objective measures were completed at Evaluation unless otherwise noted. COGNITION: Overall cognitive status:  Within functional limits for tasks assessed   SENSATION: WFL  COORDINATION: WFL  POSTURE: rounded shoulders and forward head  LOWER EXTREMITY ROM:     Passive  Right Eval Left Eval  Hip flexion    Hip extension    Hip abduction    Hip adduction    Hip internal rotation    Hip external rotation    Knee flexion    Knee extension    Ankle dorsiflexion    Ankle plantarflexion    Ankle inversion    Ankle eversion     (Blank rows = not tested)  LOWER EXTREMITY MMT:    MMT Right Eval Left Eval  Hip flexion 4+ 4-  Hip extension 4 4  Hip abduction 4 4  Hip adduction    Hip internal rotation    Hip external rotation    Knee flexion 4- 4-  Knee extension 4- 4-  Ankle dorsiflexion    Ankle plantarflexion    Ankle inversion    Ankle eversion    (Blank rows = not tested)  BED MOBILITY:  Sit to supine Complete Independence Supine to sit Complete Independence Rolling to Right Complete Independence Rolling to Left Complete Independence  TRANSFERS: Assistive device utilized: None  Sit to stand: Complete Independence Stand to sit: Complete Independence Chair to chair: Modified independence  STAIRS: Level of Assistance: Min A Stair Negotiation Technique: Step to Pattern with Bilateral Rails Number of Stairs: 4   GAIT: Gait pattern: decreased stance time- Right, decreased stride length, decreased hip/knee flexion- Right, decreased ankle dorsiflexion- Right, and decreased ankle dorsiflexion- Left Distance walked: 257ft Assistive device utilized: None Level of assistance: SBA Comments: weight shift right  FUNCTIONAL TESTS:  30 seconds chair stand test: 5 reps 2 minute walk test: 195 feet, (d/c at 1:30 d/t pt fatigue)  PATIENT SURVEYS:  Patient-specific activity scoring scheme (Point to one number): (unable) 0-10 (able at or beyond PLOF)  Activity Initial 1.Stooping :3 2.Climbing stairs: 5 3.Walking long distances: 3    sum of the activity scores/number  of activities; Total score =11  Minimum detectable change (90%CI) for average score = 2 points Minimum detectable change (90%CI) for single activity score = 3 points    Baseline vitals: BP: 130/60 MmHg HR: 80 bpm SPO2: 97% Respirations: 18  [facility] Adult PT Treatment:                                                DATE: 08/19/23 Therapeutic Exercise: Nustep L4 8 min Seated hamstring stretch 30s x2 Slant board stretch 30s x2 Neuromuscular re-ed: STS from airex 10x no UE Support FAQs w/adduction 15x Standing heel/toe with UE support 12/12 Therapeutic Activity: Supine hip fallouts GTB 12x B, 12/12 unilaterally S/L clams GTB 12/12 Bridge against GTB 12x Seated hamstring curls GTB 12/12  [facility] Adult PT Treatment:                                                DATE: 08/16/23 Therapeutic Exercise:  Nustep L3 8 min Neuromuscular re-ed: STS from airex 10x no UE Support FAQs w/adduction 15x Standing heel/toe with UE support 10x Therapeutic Activity: Supine hip fallouts GTB 10x B, 10/10 unilaterally S/L clams GTB 10/10 Bridge against GTB 10x Standing hamstring curls 10/10  [facility] Adult PT Treatment:                                                DATE: 08/14/2023 Therapeutic Activity: NuStep 8' for activity tolerance STS  2x12, GTB for knee valgus cue Standing hip ext/abd 2x10 Forward/lat step up 2x10, lead w/LLE HEP review and update                                                                                                                             [facility] Adult PT Treatment:                                                DATE: 07/31/2023 Self Care: POC discussion Pt education    PATIENT EDUCATION: Education details:Pt received education regarding HEP performance, ADL performance, functional activity tolerance, impairment education, appropriate performance of therapeutic activities.  Person educated: Patient Education method: Explanation, Demonstration, Tactile cues,  Verbal cues, and Handouts Education comprehension: verbalized understanding and returned demonstration  HOME EXERCISE PROGRAM: Access Code: [email or web address] URL: [email or web address] Date: 08/14/2023 Prepared by: Albesa Huguenin  Exercises - Walking  - 2 x daily - 7 x weekly - 1 sets - 1 reps - 13m hold - Sit to Stand Without Arm Support  - 1 x daily - 7 x weekly - 2-3 sets - 12 reps - Standing Hip Abduction with Counter Support  - 1 x daily - 7 x weekly - 3 sets - 10 reps - 2 hold - Standing Hip Extension with Counter Support  - 1 x daily - 7 x weekly - 3 sets - 10 reps - 2 hold  ---------------------------------------------------------------------------------------------  ASSESSMENT:  CLINICAL IMPRESSION: Continued to address strength and endurance deficits.  Increased reps and resistance as noted.  Added additional functional strength tasks as tolerated.  Patient able to complete all requested tasks at a rapid pace with cuing needed to pace.   Pt attended physical therapy session for continuation of treatment regarding generalized weakness and activity tolerance. Today's treatment focused on HEP education/performance, improvement of  functional hip strength, stair ambulation quality, and activity tolerance. Pt showed  great tolerance to administered treatment with no adverse effects by the end of session. Skilled intervention was utilized via activity modification for pt tolerance with task completion, functional progression/regression promoting best outcomes inline with current rehab goals, as well as moderate verbal/tactile cuing alongside no physical assistance for safe and appropriate  performance of today's activities. Continue with therapeutic focus on activity tolerance, stair/ambulation quality, and functional hip strength/endurance.    Eval impression (07/31/2023): Pt. attended today's physical therapy session for evaluation of generalized weakness. Pt has complaints of  difficulties with stooping, walking long distances and climbing stairs. Pt has notable deficits with BLE weakness, gait quality, gait/activity tolerance.   Pt would benefit from therapeutic focus on global BLE strengthening, functional technique and strengthening of transfers, gait quality practice, and gradual progression of activity tolerance.  Treatment performed today focused on pt education detailed in obj. Pt demonstrated good understanding of education provided. required minimal verbal/tactile cues and min assistance for appropriate performance with today's activities. Pt requires the intervention of skilled outpatient physical therapy to address the aforementioned deficits and progress towards a functional level in line with therapeutic goals.   OBJECTIVE IMPAIRMENTS: Abnormal gait, cardiopulmonary status limiting activity, decreased activity tolerance, decreased balance, decreased knowledge of use of DME, difficulty walking, decreased strength, decreased safety awareness, improper body mechanics, and pain.   ACTIVITY LIMITATIONS: carrying, bending, squatting, stairs, transfers, and locomotion level  PARTICIPATION LIMITATIONS: cleaning, laundry, interpersonal relationship, community activity, and church  PERSONAL FACTORS: Age, Behavior pattern, Fitness, and 3+ comorbidities: previous stroke, HTN, and ESRD are also affecting patient's functional outcome.   REHAB POTENTIAL: Fair    CLINICAL DECISION MAKING: Stable/uncomplicated  EVALUATION COMPLEXITY: Moderate --------------------------------------------------------------------------------------------- GOALS: Goals reviewed with patient? Yes  SHORT TERM GOALS: Target date: 08/21/2023  Pt will be independent with administered HEP to demonstrate the competency necessary for long term managemnet of symptoms at home. Baseline: Goal status: INITIAL  LONG TERM GOALS: Target date: 09/11/2023   Pt will independently ambulate 48ft with no AD  or LOB to demonstrate improved weightbearing tolerance, BLE strength, and functional capacity for community ambulation. Baseline: 241ft Goal status: INITIAL  2.  Pt will improve 30s STS to 7 reps to demonstrate improved functional strength with transfers. Baseline: 5 Goal status: INITIAL  3.  Pt will tolerate ( and ambulate at least 246ft with independence and no LOB to demonstrate improved capacity for prolonged gait in the community. Baseline: d/c test at 1:30 Goal status: INITIAL  4.  Pt will improve global BLE strength to 4+/5 to dmeosntrate strength necessary for high quliaty movement during ADLs and functional movements such as stooping Baseline: see obj chart Goal status: INITIAL  5.  Pt will improve PSFS to 13 with initial 3 activities to demonstrate improved perceived function with common daily activities. Baseline: 11 Goal status: INITIAL   PLAN:  PT FREQUENCY: 1-2x/week  PT DURATION: 6 weeks  PLANNED INTERVENTIONS: 97110-Therapeutic exercises, 97530- Therapeutic activity, [id number]- Neuromuscular re-education, 97535- Self Care, [id number]- Manual therapy, [phone]- Gait training, [phone]- Electrical stimulation (manual), Patient/Family education, Balance training, Stair training, and DME instructions  PLAN FOR NEXT SESSION: Continue with therapeutic focus on activity tolerance, stair/ambulation quality, and functional hip strength/endurance. F/u with HEP next session   Edwina Gram PT  08/19/2023, 2:38 PM  For all possible CPT codes, reference the Planned Interventions line above.     Check all conditions that are expected to impact treatment: {Conditions expected to impact treatment:Ongoing dialysis or cancer treatment   If treatment provided at initial evaluation, no treatment charged due to lack of authorization.

## 2023-08-21 ENCOUNTER — Encounter: Payer: Self-pay | Admitting: Physical Therapy

## 2023-08-21 ENCOUNTER — Ambulatory Visit: Admitting: Physical Therapy

## 2023-08-21 DIAGNOSIS — R2689 Other abnormalities of gait and mobility: Secondary | ICD-10-CM

## 2023-08-21 DIAGNOSIS — M6281 Muscle weakness (generalized): Secondary | ICD-10-CM | POA: Diagnosis not present

## 2023-08-21 NOTE — Therapy (Signed)
 OUTPATIENT PHYSICAL THERAPY TREATMENT NOTE   Patient Name: Karen [name] MRN: [id number] DOB:[birth year], 68 y.o., female Today's Date: 08/21/2023  END OF SESSION:  PT End of Session - 08/21/23 1523     Visit Number 5    Number of Visits 13    Date for PT Re-Evaluation 09/11/23    Authorization Type MCR    Progress Note Due on Visit 10    PT Start Time 1425    PT Stop Time 1505    PT Time Calculation (min) 40 min    Activity Tolerance Patient tolerated treatment well    Behavior During Therapy [facility] for tasks assessed/performed                Past Medical History:  Diagnosis Date   Anemia associated with chronic renal failure    Arthritis    Convulsions/seizures (HCC) 01/28/2013   Dyslipidemia    GERD (gastroesophageal reflux disease)    Hyperlipidemia    Hypertension    Kidney transplanted    Peripheral vascular disease (HCC)    Seizure disorder (HCC)    Stroke (HCC)    "possible mini stroke" years ago per patient- patient no longer sees neurologist   Past Surgical History:  Procedure Laterality Date   A/V FISTULAGRAM N/A 03/15/2023   Procedure: A/V Fistulagram;  Surgeon: Patrick Boor, MD;  Location: [facility];  Service: Cardiovascular;  Laterality: N/A;   ABDOMINAL AORTOGRAM W/LOWER EXTREMITY Left 04/25/2023   Procedure: ABDOMINAL AORTOGRAM W/LOWER EXTREMITY;  Surgeon: Young Hensen, MD;  Location: [facility];  Service: Cardiovascular;  Laterality: Left;   CATHETER REMOVAL     COLONOSCOPY     GALLBLADDER SURGERY     INSERTION OF DIALYSIS CATHETER Left 08/11/2021   Procedure: INSERTION LEFT INTERNAL JUGULAR TUNNELED DIALYSIS CATHETER;  Surgeon: Young Hensen, MD;  Location: [facility] OR;  Service: Vascular;  Laterality: Left;   KIDNEY TRANSPLANT     04/10/2009   PERIPHERAL VASCULAR BALLOON ANGIOPLASTY Left 04/25/2023   Procedure: PERIPHERAL VASCULAR BALLOON ANGIOPLASTY;  Surgeon: Young Hensen, MD;  Location: [facility];   Service: Cardiovascular;  Laterality: Left;   REVISON OF ARTERIOVENOUS FISTULA Right 08/11/2021   Procedure: RIGHT ARM ARTERIOVENOUS FISTULA REVISION WITH ANEURYSM REMOVAL;  Surgeon: Young Hensen, MD;  Location: [facility] OR;  Service: Vascular;  Laterality: Right;   Patient Active Problem List   Diagnosis Date Noted   Volume overload 07/15/2023   Critical limb ischemia of left lower extremity (HCC) 04/16/2023   Malnutrition of moderate degree 03/01/2022   Community acquired pneumonia of left lung 02/27/2022   ESRD on hemodialysis (HCC) 08/04/2021   Hypertension 06/15/2021   Dyslipidemia 06/15/2021   Renal transplant recipient 08/13/2015   History of seizure 01/28/2013    PCP: Leandra Pro, MD  REFERRING PROVIDER: Oral Billings, MD  REFERRING DIAG: Weakness [R53.1]   Rationale for Evaluation and Treatment: Rehabilitation  THERAPY DIAG:  Muscle weakness (generalized)  Other abnormalities of gait and mobility  PERTINENT HISTORY: Hx of seizure, HTN, ESRD with dialysis, hx of stroke  WEIGHT BEARING RESTRICTIONS: No  FALLS:  Has patient fallen in last 6 months? No  LIVING ENVIRONMENT: Lives with: lives alone Lives in: House/apartment Stairs: No Has following equipment at home: Single point cane  OCCUPATION: retired   PRECAUTIONS: None ---------------------------------------------------------------------------------------------  SUBJECTIVE:  SUBJECTIVE STATEMENT: No pain reported    RED FLAGS: None   PLOF: Independent  PATIENT GOALS: get stronger ---------------------------------------------------------------------------------------------  OBJECTIVE:  Note: Objective measures were completed at Evaluation unless otherwise noted. COGNITION: Overall cognitive status:  Within functional limits for tasks assessed   SENSATION: WFL  COORDINATION: WFL  POSTURE: rounded shoulders and forward head  LOWER EXTREMITY ROM:     Passive  Right Eval Left Eval  Hip flexion    Hip extension    Hip abduction    Hip adduction    Hip internal rotation    Hip external rotation    Knee flexion    Knee extension    Ankle dorsiflexion    Ankle plantarflexion    Ankle inversion    Ankle eversion     (Blank rows = not tested)  LOWER EXTREMITY MMT:    MMT Right Eval Left Eval  Hip flexion 4+ 4-  Hip extension 4 4  Hip abduction 4 4  Hip adduction    Hip internal rotation    Hip external rotation    Knee flexion 4- 4-  Knee extension 4- 4-  Ankle dorsiflexion    Ankle plantarflexion    Ankle inversion    Ankle eversion    (Blank rows = not tested)  BED MOBILITY:  Sit to supine Complete Independence Supine to sit Complete Independence Rolling to Right Complete Independence Rolling to Left Complete Independence  TRANSFERS: Assistive device utilized: None  Sit to stand: Complete Independence Stand to sit: Complete Independence Chair to chair: Modified independence  STAIRS: Level of Assistance: Min A Stair Negotiation Technique: Step to Pattern with Bilateral Rails Number of Stairs: 4   GAIT: Gait pattern: decreased stance time- Right, decreased stride length, decreased hip/knee flexion- Right, decreased ankle dorsiflexion- Right, and decreased ankle dorsiflexion- Left Distance walked: 259ft Assistive device utilized: None Level of assistance: SBA Comments: weight shift right  FUNCTIONAL TESTS:  30 seconds chair stand test: 5 reps 2 minute walk test: 195 feet, (d/c at 1:30 d/t pt fatigue)  PATIENT SURVEYS:  Patient-specific activity scoring scheme (Point to one number): (unable) 0-10 (able at or beyond PLOF)  Activity Initial 1.Stooping :3 2.Climbing stairs: 5 3.Walking long distances: 3    sum of the activity scores/number  of activities; Total score =11  Minimum detectable change (90%CI) for average score = 2 points Minimum detectable change (90%CI) for single activity score = 3 points    Baseline vitals: BP: 130/60 MmHg HR: 80 bpm SPO2: 97% Respirations: 18  [facility] Adult PT Treatment:                                                DATE: 08/21/2023  Therapeutic Exercise: Seated hamstring stretch w/apt 2x1' Slant board stretch  2x1' Standing heel/toe with UE support  2x12B  Therapeutic Activity: NuStep 8' 8' for activity tolerance STS from airex 2x12, no UE support. V/T cues for knee valgus Long lever Supine bridge  2x12, hold 3s   [facility] Adult PT Treatment:                                                DATE: 08/19/23 Therapeutic Exercise: Nustep L4 8 min Seated hamstring stretch  30s x2 Slant board stretch 30s x2 Neuromuscular re-ed: STS from airex 10x no UE Support FAQs w/adduction 15x Standing heel/toe with UE support 12/12 Therapeutic Activity: Supine hip fallouts GTB 12x B, 12/12 unilaterally S/L clams GTB 12/12 Bridge against GTB 12x Seated hamstring curls GTB 12/12   PATIENT EDUCATION: Education details:Pt received education regarding HEP performance, ADL performance, functional activity tolerance, impairment education, appropriate performance of therapeutic activities.  Person educated: Patient Education method: Explanation, Demonstration, Tactile cues, Verbal cues, and Handouts Education comprehension: verbalized understanding and returned demonstration  HOME EXERCISE PROGRAM: Access Code: [email or web address] URL: [email or web address] Date: 08/14/2023 Prepared by: Albesa Huguenin  Exercises - Walking  - 2 x daily - 7 x weekly - 1 sets - 1 reps - 62m hold - Sit to Stand Without Arm Support  - 1 x daily - 7 x weekly - 2-3 sets - 12 reps - Standing Hip Abduction with Counter Support  - 1 x daily - 7 x weekly - 3 sets - 10 reps - 2 hold - Standing Hip Extension with  Counter Support  - 1 x daily - 7 x weekly - 3 sets - 10 reps - 2 hold  ---------------------------------------------------------------------------------------------  ASSESSMENT:  CLINICAL IMPRESSION:  Pt attended physical therapy session for continuation of treatment regarding generalized weakness. Today's treatment focused on improvement of  activity tolerance, global BLE strengthening and dynamic balance with functional activity such as STS. Pt showed  great tolerance to administered treatment with no adverse effects by the end of session. Skilled intervention was utilized via activity modification for pt tolerance with task completion, functional progression/regression promoting best outcomes inline with current rehab goals, as well as minimal verbal/tactile cuing alongside no physical assistance for safe and appropriate performance of today's activities. Continue to progress towards higher level gait endurance, stair ambulation quality and stooping as indicated by PSFS.  Eval impression (07/31/2023): Pt. attended today's physical therapy session for evaluation of generalized weakness. Pt has complaints of difficulties with stooping, walking long distances and climbing stairs. Pt has notable deficits with BLE weakness, gait quality, gait/activity tolerance.   Pt would benefit from therapeutic focus on global BLE strengthening, functional technique and strengthening of transfers, gait quality practice, and gradual progression of activity tolerance.  Treatment performed today focused on pt education detailed in obj. Pt demonstrated good understanding of education provided. required minimal verbal/tactile cues and min assistance for appropriate performance with today's activities. Pt requires the intervention of skilled outpatient physical therapy to address the aforementioned deficits and progress towards a functional level in line with therapeutic goals.   OBJECTIVE IMPAIRMENTS: Abnormal gait,  cardiopulmonary status limiting activity, decreased activity tolerance, decreased balance, decreased knowledge of use of DME, difficulty walking, decreased strength, decreased safety awareness, improper body mechanics, and pain.   ACTIVITY LIMITATIONS: carrying, bending, squatting, stairs, transfers, and locomotion level  PARTICIPATION LIMITATIONS: cleaning, laundry, interpersonal relationship, community activity, and church  PERSONAL FACTORS: Age, Behavior pattern, Fitness, and 3+ comorbidities: previous stroke, HTN, and ESRD are also affecting patient's functional outcome.   REHAB POTENTIAL: Fair    CLINICAL DECISION MAKING: Stable/uncomplicated  EVALUATION COMPLEXITY: Moderate --------------------------------------------------------------------------------------------- GOALS: Goals reviewed with patient? Yes  SHORT TERM GOALS: Target date: 08/21/2023  Pt will be independent with administered HEP to demonstrate the competency necessary for long term managemnet of symptoms at home. Baseline: Goal status: INITIAL  LONG TERM GOALS: Target date: 09/11/2023   Pt will independently ambulate 429ft with no AD or LOB to demonstrate improved weightbearing tolerance, BLE strength, and  functional capacity for community ambulation. Baseline: 281ft Goal status: INITIAL  2.  Pt will improve 30s STS to 7 reps to demonstrate improved functional strength with transfers. Baseline: 5 Goal status: INITIAL  3.  Pt will tolerate ( and ambulate at least 240ft with independence and no LOB to demonstrate improved capacity for prolonged gait in the community. Baseline: d/c test at 1:30 Goal status: INITIAL  4.  Pt will improve global BLE strength to 4+/5 to dmeosntrate strength necessary for high quliaty movement during ADLs and functional movements such as stooping Baseline: see obj chart Goal status: INITIAL  5.  Pt will improve PSFS to 13 with initial 3 activities to demonstrate improved  perceived function with common daily activities. Baseline: 11 Goal status: INITIAL   PLAN:  PT FREQUENCY: 1-2x/week  PT DURATION: 6 weeks  PLANNED INTERVENTIONS: 97110-Therapeutic exercises, 97530- Therapeutic activity, [id number]- Neuromuscular re-education, 97535- Self Care, [id number]- Manual therapy, [phone]- Gait training, [phone]- Electrical stimulation (manual), Patient/Family education, Balance training, Stair training, and DME instructions  PLAN FOR NEXT SESSION:  Continue to progress towards higher level gait endurance, stair ambulation quality and stooping as indicated by PSFS.   Albesa Huguenin, PT, DPT 08/21/2023, 4:06 PM   For all possible CPT codes, reference the Planned Interventions line above.     Check all conditions that are expected to impact treatment: {Conditions expected to impact treatment:Ongoing dialysis or cancer treatment   If treatment provided at initial evaluation, no treatment charged due to lack of authorization.

## 2023-08-28 ENCOUNTER — Ambulatory Visit: Admitting: Physical Therapy

## 2023-08-28 ENCOUNTER — Encounter: Payer: Self-pay | Admitting: Physical Therapy

## 2023-08-28 DIAGNOSIS — M6281 Muscle weakness (generalized): Secondary | ICD-10-CM | POA: Diagnosis not present

## 2023-08-28 DIAGNOSIS — R2689 Other abnormalities of gait and mobility: Secondary | ICD-10-CM

## 2023-08-28 NOTE — Therapy (Signed)
 OUTPATIENT PHYSICAL THERAPY TREATMENT NOTE   Patient Name: Karen [name] MRN: [id number] DOB:1955/12/20, 68 y.o., female Today's Date: 08/28/2023  END OF SESSION:  PT End of Session - 08/28/23 1433     Visit Number 6    Number of Visits 13    Date for PT Re-Evaluation 09/11/23    Authorization Type MCR    PT Start Time 1356    PT Stop Time 1434    PT Time Calculation (min) 38 min    Activity Tolerance Patient tolerated treatment well    Behavior During Therapy [facility] for tasks assessed/performed                 Past Medical History:  Diagnosis Date   Anemia associated with chronic renal failure    Arthritis    Convulsions/seizures (HCC) 01/28/2013   Dyslipidemia    GERD (gastroesophageal reflux disease)    Hyperlipidemia    Hypertension    Kidney transplanted    Peripheral vascular disease (HCC)    Seizure disorder (HCC)    Stroke (HCC)    "possible mini stroke" years ago per patient- patient no longer sees neurologist   Past Surgical History:  Procedure Laterality Date   A/V FISTULAGRAM N/A 03/15/2023   Procedure: A/V Fistulagram;  Surgeon: Patrick Boor, MD;  Location: [facility] INVASIVE [facility];  Service: Cardiovascular;  Laterality: N/A;   ABDOMINAL AORTOGRAM W/LOWER EXTREMITY Left 04/25/2023   Procedure: ABDOMINAL AORTOGRAM W/LOWER EXTREMITY;  Surgeon: Young Hensen, MD;  Location: [facility] INVAS[facility] CV [facility];  Service: Cardiovascular;  Laterality: Left;   CATHETER REMOVAL     COLONOSCOPY     GALLBLADDER SURGERY     INSERTION OF DIALYSIS CATHETER Left 08/11/2021   Procedure: INSERTION LEFT INTERNAL JUGULAR TUNNELED DIALYSIS CATHETER;  Surgeon: Young Hensen, MD;  Location: [facility] OR;  Service: Vascular;  Laterality: Left;   KIDNEY TRANSPLANT     04/10/2009   PERIPHERAL VASCULAR BALLOON ANGIOPLASTY Left 04/25/2023   Procedure: PERIPHERAL VASCULAR BALLOON ANGIOPLASTY;  Surgeon: Young Hensen, MD;  Location: [facility] CV [facility];  Service: Cardiovascular;   Laterality: Left;   REVISON OF ARTERIOVENOUS FISTULA Right 08/11/2021   Procedure: RIGHT ARM ARTERIOVENOUS FISTULA REVISION WITH ANEURYSM REMOVAL;  Surgeon: Young Hensen, MD;  Location: [facility] OR;  Service: Vascular;  Laterality: Right;   Patient Active Problem List   Diagnosis Date Noted   Volume overload 07/15/2023   Critical limb ischemia of left lower extremity (HCC) 04/16/2023   Malnutrition of moderate degree 03/01/2022   Community acquired pneumonia of left lung 02/27/2022   ESRD on hemodialysis (HCC) 08/04/2021   Hypertension 06/15/2021   Dyslipidemia 06/15/2021   Renal transplant recipient 08/13/2015   History of seizure 01/28/2013    PCP: Leandra Pro, MD  REFERRING PROVIDER: Oral Billings, MD  REFERRING DIAG: Weakness [R53.1]   Rationale for Evaluation and Treatment: Rehabilitation  THERAPY DIAG:  Muscle weakness (generalized)  Other abnormalities of gait and mobility  PERTINENT HISTORY: Hx of seizure, HTN, ESRD with dialysis, hx of stroke  WEIGHT BEARING RESTRICTIONS: No  FALLS:  Has patient fallen in last 6 months? No  LIVING ENVIRONMENT: Lives with: lives alone Lives in: House/apartment Stairs: No Has following equipment at home: Single point cane  OCCUPATION: retired   PRECAUTIONS: None ---------------------------------------------------------------------------------------------  SUBJECTIVE:  SUBJECTIVE STATEMENT: Pt attended today's session with reports of 0/10 pain. Pt stated that they have maintained good compliance with current HEP.  Overall feels she has gotten a bit better of all aspects of function.    RED FLAGS: None   PLOF: Independent  PATIENT GOALS: get  stronger ---------------------------------------------------------------------------------------------  OBJECTIVE:  Note: Objective measures were completed at Evaluation unless otherwise noted. COGNITION: Overall cognitive status: Within functional limits for tasks assessed   SENSATION: WFL  COORDINATION: WFL  POSTURE: rounded shoulders and forward head  LOWER EXTREMITY ROM:     Passive  Right Eval Left Eval  Hip flexion    Hip extension    Hip abduction    Hip adduction    Hip internal rotation    Hip external rotation    Knee flexion    Knee extension    Ankle dorsiflexion    Ankle plantarflexion    Ankle inversion    Ankle eversion     (Blank rows = not tested)  LOWER EXTREMITY MMT:    MMT Right Eval 08/28/2023  Left Eval 08/28/2023   Hip flexion 4+ 5 4- 4+  Hip extension 4 4+ 4 4+  Hip abduction 4 4+ 4 4+  Hip adduction      Hip internal rotation      Hip external rotation      Knee flexion 4- 5 4- 5  Knee extension 4- 5 4- 4+  Ankle dorsiflexion      Ankle plantarflexion      Ankle inversion      Ankle eversion      (Blank rows = not tested)  BED MOBILITY:  Sit to supine Complete Independence Supine to sit Complete Independence Rolling to Right Complete Independence Rolling to Left Complete Independence  TRANSFERS: Assistive device utilized: None  Sit to stand: Complete Independence Stand to sit: Complete Independence Chair to chair: Modified independence  STAIRS: Level of Assistance: Min A Stair Negotiation Technique: Step to Pattern with Bilateral Rails Number of Stairs: 4   GAIT: Gait pattern: decreased stance time- Right, decreased stride length, decreased hip/knee flexion- Right, decreased ankle dorsiflexion- Right, and decreased ankle dorsiflexion- Left Distance walked: 23ft Assistive device utilized: None Level of assistance: SBA Comments: weight shift right  FUNCTIONAL TESTS:  30 seconds chair stand test: 5 reps 08/28/2023:  10  2 minute walk test: 195 feet, (d/c at 1:30 d/t pt fatigue) 08/28/2023:   PATIENT SURVEYS:  Patient-specific activity scoring scheme (Point to one number): (unable) 0-10 (able at or beyond PLOF)  Activity Initial 1.Stooping :3 2.Climbing stairs: 5 3.Walking long distances: 3 sum of the activity scores/number of activities; Total score =11  08/28/2023  1.Stooping: 7 2.Climbing stairs: 7 3.Walking long distances: 8 sum of the activity scores/number of activities; Total score =22  Minimum detectable change (90%CI) for average score = 2 points Minimum detectable change (90%CI) for single activity score = 3 points    Baseline vitals: BP: 130/60 MmHg HR: 80 bpm SPO2: 97% Respirations: 18  [facility] Adult PT Treatment:                                                DATE: 08/28/2023 Therapeutic Activity: Re-evaluative measures Self Care: POC discussion Pt education   [facility] Adult PT Treatment:  DATE: 08/21/2023  Therapeutic Exercise: Seated hamstring stretch w/apt 2x1' Slant board stretch  2x1' Standing heel/toe with UE support  2x12B  Therapeutic Activity: NuStep 8' 8' for activity tolerance STS from airex 2x12, no UE support. V/T cues for knee valgus Long lever Supine bridge  2x12, hold 3s   [facility] Adult PT Treatment:                                                DATE: 08/19/23 Therapeutic Exercise: Nustep L4 8 min Seated hamstring stretch 30s x2 Slant board stretch 30s x2 Neuromuscular re-ed: STS from airex 10x no UE Support FAQs w/adduction 15x Standing heel/toe with UE support 12/12 Therapeutic Activity: Supine hip fallouts GTB 12x B, 12/12 unilaterally S/L clams GTB 12/12 Bridge against GTB 12x Seated hamstring curls GTB 12/12   PATIENT EDUCATION: Education details:Pt received education regarding HEP performance, ADL performance, functional activity tolerance, impairment education, appropriate performance  of therapeutic activities.  Person educated: Patient Education method: Explanation, Demonstration, Tactile cues, Verbal cues, and Handouts Education comprehension: verbalized understanding and returned demonstration  HOME EXERCISE PROGRAM: Access Code: [email or web address] URL: [email or web address] Date: 08/14/2023 Prepared by: Albesa Huguenin  Exercises - Walking  - 2 x daily - 7 x weekly - 1 sets - 1 reps - 101m hold - Sit to Stand Without Arm Support  - 1 x daily - 7 x weekly - 2-3 sets - 12 reps - Standing Hip Abduction with Counter Support  - 1 x daily - 7 x weekly - 3 sets - 10 reps - 2 hold - Standing Hip Extension with Counter Support  - 1 x daily - 7 x weekly - 3 sets - 10 reps - 2 hold  ---------------------------------------------------------------------------------------------  ASSESSMENT:  CLINICAL IMPRESSION:  Pt attended physical therapy session for re-evaluation of generalized weakness. Pt has met  all goals and is currently content with functional level.required minimal v/t cuing as well as no assistance for safe and appropriate performance of today's activities. Pt is to be d/c at completion of today's session d/t meeting all rehab goals. Education was given to continue applying ADL education from previous sessions as well as performing HEP as prescribed with freedom to progress as tolerated using previous education on modification and exercise dosage. Pt has displayed and verbalized competence regarding this education.   Eval impression (07/31/2023): Pt. attended today's physical therapy session for evaluation of generalized weakness. Pt has complaints of difficulties with stooping, walking long distances and climbing stairs. Pt has notable deficits with BLE weakness, gait quality, gait/activity tolerance.   Pt would benefit from therapeutic focus on global BLE strengthening, functional technique and strengthening of transfers, gait quality practice, and gradual progression  of activity tolerance.  Treatment performed today focused on pt education detailed in obj. Pt demonstrated good understanding of education provided. required minimal verbal/tactile cues and min assistance for appropriate performance with today's activities. Pt requires the intervention of skilled outpatient physical therapy to address the aforementioned deficits and progress towards a functional level in line with therapeutic goals.   OBJECTIVE IMPAIRMENTS: Abnormal gait, cardiopulmonary status limiting activity, decreased activity tolerance, decreased balance, decreased knowledge of use of DME, difficulty walking, decreased strength, decreased safety awareness, improper body mechanics, and pain.   ACTIVITY LIMITATIONS: carrying, bending, squatting, stairs, transfers, and locomotion level  PARTICIPATION LIMITATIONS: cleaning, laundry, interpersonal relationship, community activity, and church  PERSONAL FACTORS: Age, Behavior pattern, Fitness, and 3+ comorbidities: previous stroke, HTN, and ESRD are also affecting patient's functional outcome.   REHAB POTENTIAL: Fair    CLINICAL DECISION MAKING: Stable/uncomplicated  EVALUATION COMPLEXITY: Moderate --------------------------------------------------------------------------------------------- GOALS: Goals reviewed with patient? Yes  SHORT TERM GOALS: Target date: 08/21/2023  Pt will be independent with administered HEP to demonstrate the competency necessary for long term managemnet of symptoms at home. Baseline: Goal status: MET 08/28/2023   LONG TERM GOALS: Target date: 09/11/2023   Pt will independently ambulate 423ft with no AD or LOB to demonstrate improved weightbearing tolerance, BLE strength, and functional capacity for community ambulation. Baseline: 210ft 08/28/2023: 431ft no ad, no LOB Goal status: MET 08/28/2023   2.  Pt will improve 30s STS to 7 reps to demonstrate improved functional strength with transfers. Baseline:  5 08/28/2023:10 Goal status :MET 08/28/2023  3.  Pt will tolerate ( and ambulate at least 253ft with independence and no LOB to demonstrate improved capacity for prolonged gait in the community. Baseline: d/c test at 1:30 08/28/2023: 358ft Goal status: MET 08/28/2023  4.  Pt will improve global BLE strength to 4+/5 to dmeosntrate strength necessary for high quliaty movement during ADLs and functional movements such as stooping Baseline: see obj chart Goal status: MET 08/28/2023  5.  Pt will improve PSFS to 13 with initial 3 activities to demonstrate improved perceived function with common daily activities. Baseline: 11 08/28/2023:22 Goal status: MET 08/28/2023   PLAN:  PT FREQUENCY: 1-2x/week  PT DURATION: 6 weeks  PLANNED INTERVENTIONS: 97110-Therapeutic exercises, 97530- Therapeutic activity, 97112- Neuromuscular re-education, 97535- Self Care, 25366- Manual therapy, [phone]- Gait training, [phone]- Electrical stimulation (manual), Patient/Family education, Balance training, Stair training, and DME instructions  PLAN FOR NEXT SESSION:  Continue to progress towards higher level gait endurance, stair ambulation quality and stooping as indicated by PSFS.  PHYSICAL THERAPY DISCHARGE SUMMARY  Visits from Start of Care: 5  Current functional level related to goals / functional outcomes: All goals met   Remaining deficits: none   Education / Equipment: See assessment   Patient agrees to discharge. Patient goals were met. Patient is being discharged due to meeting the stated rehab goals.   Albesa Huguenin, PT, DPT 08/28/2023, 2:35 PM   For all possible CPT codes, reference the Planned Interventions line above.     Check all conditions that are expected to impact treatment: {Conditions expected to impact treatment:Ongoing dialysis or cancer treatment   If treatment provided at initial evaluation, no treatment charged due to lack of authorization.

## 2023-09-17 ENCOUNTER — Other Ambulatory Visit: Payer: Self-pay

## 2023-09-17 ENCOUNTER — Emergency Department (HOSPITAL_COMMUNITY)

## 2023-09-17 ENCOUNTER — Emergency Department (HOSPITAL_COMMUNITY)
Admission: EM | Admit: 2023-09-17 | Discharge: 2023-09-17 | Disposition: A | Discharge status: Home / Self Care | Attending: Emergency Medicine | Admitting: Emergency Medicine

## 2023-09-17 ENCOUNTER — Encounter (HOSPITAL_COMMUNITY): Payer: Self-pay

## 2023-09-17 DIAGNOSIS — N186 End stage renal disease: Secondary | ICD-10-CM | POA: Diagnosis not present

## 2023-09-17 DIAGNOSIS — Z7902 Long term (current) use of antithrombotics/antiplatelets: Secondary | ICD-10-CM | POA: Insufficient documentation

## 2023-09-17 DIAGNOSIS — Z992 Dependence on renal dialysis: Secondary | ICD-10-CM | POA: Diagnosis not present

## 2023-09-17 DIAGNOSIS — R310 Gross hematuria: Secondary | ICD-10-CM | POA: Insufficient documentation

## 2023-09-17 DIAGNOSIS — I120 Hypertensive chronic kidney disease with stage 5 chronic kidney disease or end stage renal disease: Secondary | ICD-10-CM | POA: Insufficient documentation

## 2023-09-17 DIAGNOSIS — R319 Hematuria, unspecified: Secondary | ICD-10-CM | POA: Diagnosis present

## 2023-09-17 DIAGNOSIS — Z7982 Long term (current) use of aspirin: Secondary | ICD-10-CM | POA: Diagnosis not present

## 2023-09-17 DIAGNOSIS — Z940 Kidney transplant status: Secondary | ICD-10-CM | POA: Insufficient documentation

## 2023-09-17 LAB — CBC WITH DIFFERENTIAL/PLATELET
Abs Immature Granulocytes: 0.01 10*3/uL (ref 0.00–0.07)
Basophils Absolute: 0.1 10*3/uL (ref 0.0–0.1)
Basophils Relative: 2 %
Eosinophils Absolute: 0.6 10*3/uL — ABNORMAL HIGH (ref 0.0–0.5)
Eosinophils Relative: 15 %
HCT: 37.6 % (ref 36.0–46.0)
Hemoglobin: 11.8 g/dL — ABNORMAL LOW (ref 12.0–15.0)
Immature Granulocytes: 0 %
Lymphocytes Relative: 26 %
Lymphs Abs: 1 10*3/uL (ref 0.7–4.0)
MCH: 28.3 pg (ref 26.0–34.0)
MCHC: 31.4 g/dL (ref 30.0–36.0)
MCV: 90.2 fL (ref 80.0–100.0)
Monocytes Absolute: 0.5 10*3/uL (ref 0.1–1.0)
Monocytes Relative: 12 %
Neutro Abs: 1.7 10*3/uL (ref 1.7–7.7)
Neutrophils Relative %: 45 %
Platelets: 228 10*3/uL (ref 150–400)
RBC: 4.17 MIL/uL (ref 3.87–5.11)
RDW: 15.6 % — ABNORMAL HIGH (ref 11.5–15.5)
WBC: 3.9 10*3/uL — ABNORMAL LOW (ref 4.0–10.5)
nRBC: 0 % (ref 0.0–0.2)

## 2023-09-17 LAB — BASIC METABOLIC PANEL WITH GFR
Anion gap: 11 (ref 5–15)
BUN: 5 mg/dL — ABNORMAL LOW (ref 8–23)
CO2: 28 mmol/L (ref 22–32)
Calcium: 9.4 mg/dL (ref 8.9–10.3)
Chloride: 98 mmol/L (ref 98–111)
Creatinine, Ser: 4 mg/dL — ABNORMAL HIGH (ref 0.44–1.00)
GFR, Estimated: 12 mL/min — ABNORMAL LOW (ref >60)
Glucose, Bld: 86 mg/dL (ref 70–99)
Potassium: 3.7 mmol/L (ref 3.5–5.1)
Sodium: 137 mmol/L (ref 135–145)

## 2023-09-17 LAB — URINALYSIS, W/ REFLEX TO CULTURE (INFECTION SUSPECTED): RBC / HPF: >50 RBC/hpf (ref 0–5)

## 2023-09-17 NOTE — ED Provider Notes (Signed)
 [place] EMERGENCY DEPARTMENT AT [facility] Provider Note   CSN: [id number] Arrival date & time: 09/17/23  4098     Patient presents with: Hematuria   Karen [name] is a 68 y.o. female.   HPI     68 year old female comes in with chief complaint of persistent bloody urine.  Patient has history of ESRD on hemodialysis.  She had failed transplant in 2023.  Her transplant, which was second for her was done in 2011 at [facility].  Patient states that 4 weeks ago she started noticing blood in the urine.  Patient does not urinate a lot.  She discussed this with her nephrologist and she was started on cephalosporin.  She finished a course of antibiotic last week, but the symptoms have continued.  Patient denies any nausea, vomiting, fevers, chills, pain. She was unable to get in touch with her nephrologist, therefore she decided to come to the ER with new sample of urine.  Prior to Admission medications   Medication Sig Start Date End Date Taking? Authorizing Provider  aspirin  EC 81 MG tablet Take 81 mg by mouth daily.    [provider]  calcitRIOL  (ROCALTROL ) 0.25 MCG capsule Take 0.25 mcg by mouth every Monday, Wednesday, and Friday.    [provider]  clopidogrel  (PLAVIX ) 75 MG tablet Take 1 tablet (75 mg total) by mouth daily. 04/25/23 04/24/24  Young Hensen, MD  Doxercalciferol (HECTOROL IV) Doxercalciferol (Hectorol) 02/16/23 02/15/24  [provider]  hydrALAZINE  (APRESOLINE ) 100 MG tablet Take 100 mg by mouth 3 (three) times daily.    [provider]  magnesium  oxide (MAG-OX) 400 MG tablet Take 400 mg by mouth 2 (two) times daily.     [provider]  omeprazole (PRILOSEC) 20 MG capsule Take 20 mg by mouth daily.    [provider]  predniSONE  (DELTASONE ) 2.5 MG tablet Take 2.5 mg by mouth daily.    [provider]  sevelamer  carbonate (RENVELA ) 800 MG tablet Take 800 mg by mouth 3 (three) times daily  with meals. Take 800 mg tablet with snacks 02/21/22   [provider]  simvastatin  (ZOCOR ) 20 MG tablet Take 20 mg by mouth every evening.    [provider]  VENTOLIN  HFA 108 (90 Base) MCG/ACT inhaler Inhale 1 puff into the lungs 3 times/day as needed-between meals & bedtime. Patient taking differently: Inhale 1 puff into the lungs 3 times/day as needed-between meals & bedtime for shortness of breath. 06/21/21   Arrien, Curlee Doss, MD    Allergies: Patient has no known allergies.    Review of Systems  All other systems reviewed and are negative.   Updated Vital Signs BP (!) 158/71   Pulse 81   Temp (!) 96.9 F (36.1 C) (Temporal)   Resp 18   Ht 5' (1.524 m)   Wt 56.3 kg   SpO2 100%   BMI 24.24 kg/m   Physical Exam Vitals and nursing note reviewed.  Constitutional:      Appearance: She is well-developed.  HENT:     Head: Atraumatic.   Eyes:     Extraocular Movements: Extraocular movements intact.     Pupils: Pupils are equal, round, and reactive to light.    Cardiovascular:     Rate and Rhythm: Normal rate.  Pulmonary:     Effort: Pulmonary effort is normal.   Musculoskeletal:     Cervical back: Normal range of motion and neck supple.   Skin:  General: Skin is warm and dry.   Neurological:     Mental Status: She is alert and oriented to person, place, and time.     (all labs ordered are listed, but only abnormal results are displayed) Labs Reviewed  URINALYSIS, W/ REFLEX TO CULTURE (INFECTION SUSPECTED) - Abnormal; Notable for the following components:      Result Value   Color, Urine RED (*)    APPearance TURBID (*)    Glucose, UA   (*)    Value: TEST NOT REPORTED DUE TO COLOR INTERFERENCE OF URINE PIGMENT   Hgb urine dipstick   (*)    Value: TEST NOT REPORTED DUE TO COLOR INTERFERENCE OF URINE PIGMENT   Bilirubin Urine   (*)    Value: TEST NOT REPORTED DUE TO COLOR INTERFERENCE OF URINE PIGMENT   Ketones, ur   (*)    Value:  TEST NOT REPORTED DUE TO COLOR INTERFERENCE OF URINE PIGMENT   Protein, ur   (*)    Value: TEST NOT REPORTED DUE TO COLOR INTERFERENCE OF URINE PIGMENT   Nitrite   (*)    Value: TEST NOT REPORTED DUE TO COLOR INTERFERENCE OF URINE PIGMENT   Leukocytes,Ua   (*)    Value: TEST NOT REPORTED DUE TO COLOR INTERFERENCE OF URINE PIGMENT   Non Squamous Epithelial PRESENT (*)    Bacteria, UA RARE (*)    All other components within normal limits  CBC WITH DIFFERENTIAL/PLATELET - Abnormal; Notable for the following components:   WBC 3.9 (*)    Hemoglobin 11.8 (*)    RDW 15.6 (*)    Eosinophils Absolute 0.6 (*)    All other components within normal limits  BASIC METABOLIC PANEL WITH GFR - Abnormal; Notable for the following components:   BUN 5 (*)    Creatinine, Ser 4.00 (*)    GFR, Estimated 12 (*)    All other components within normal limits  URINE CULTURE    EKG: None  Radiology: CT Renal Stone Study Result Date: 09/17/2023 CLINICAL DATA:  Flank pain.  Hematuria. EXAM: CT ABDOMEN AND PELVIS WITHOUT CONTRAST TECHNIQUE: Multidetector CT imaging of the abdomen and pelvis was performed following the standard protocol without IV contrast. RADIATION DOSE REDUCTION: This exam was performed according to the departmental dose-optimization program which includes automated exposure control, adjustment of the mA and/or kV according to patient size and/or use of iterative reconstruction technique. COMPARISON:  01/08/2021. FINDINGS: Lower chest: Right base consolidation or volume loss. Right-sided small pleural effusion which appears partially loculated laterally. Linear scarring in the lower lobes. Hepatobiliary: No focal liver abnormality is seen. Status post cholecystectomy. No biliary dilatation. Pancreas: Unremarkable. No pancreatic ductal dilatation or surrounding inflammatory changes. Spleen: Normal in size without focal abnormality. Adrenals/Urinary Tract: Native kidneys are diminutive. Left iliac  fossa transplanted kidney is dystrophic with calcifications. Right iliac fossa transplanted kidney demonstrates mild hydronephrosis with no stones or parenchymal lesions. Urinary bladder nearly empty. Stomach/Bowel: Diverticula in the sigmoid. Stomach is within normal limits. Appendix appears normal. No evidence of bowel wall thickening, distention, or inflammatory changes. Vascular/Lymphatic: Aortic atherosclerosis. No enlarged abdominal or pelvic lymph nodes. Reproductive: Status post hysterectomy. No adnexal masses. Other: No abdominal wall hernia or abnormality. No abdominopelvic ascites. Musculoskeletal: No acute or significant osseous findings. IMPRESSION: 1. Right base consolidation or volume loss with a small pleural effusion. 2. Transplanted kidneys in the iliac fossa. The left iliac fossa transplanted kidney is dystrophic with calcifications. The right iliac fossa transplanted kidney demonstrates  mild hydronephrosis. 3. Diverticulosis. 4. Aortic atherosclerosis (ICD10-I70.0). Electronically Signed   By: Sydell Eva M.D.   On: 09/17/2023 13:39     Procedures   Medications Ordered in the ED - No data to display                                  Medical Decision Making Amount and/or Complexity of Data Reviewed Labs: ordered.   68 year old female comes in with chief complaint of bloody urine.  Patient has history of hypertension, hyperlipidemia, ESRD on hemodialysis, renal transplant.  I have reviewed patient's record including discharge summary from 07-18-2023. Patient currently not on any blood thinners.  She continues to take her transplant occasions.  Differential diagnosis for this patient includes acute cystitis/hemorrhagic cystitis, bladder mass, renal mass, evolution of transplant failure.  Clinically, this does not appear to be UTI.  The patient had basic labs, CT completed in triage.  I independently interpreted patient's CT scan.  I do not see any clear hydronephrosis.   Per radiologist, there is mild hydronephrosis.  The transplanted kidneys are dystrophic.  I discussed the case with Dr. Britta Candy, nephrology.  He also suspects right now that this is probably evolution of transplant failure is causing the gross hematuria now.  He still thinks is a good idea for patient to follow-up with urology.  Recommendations discussed with the patient.    Final diagnoses:  Gross hematuria    ED Discharge Orders     None          Deatra Face, MD 09/17/23 1827

## 2023-09-17 NOTE — ED Notes (Signed)
 Paged Nephrology per verbal order by Dr Lewis Red

## 2023-09-17 NOTE — ED Notes (Signed)
 RN sent down repeat urine

## 2023-09-17 NOTE — ED Provider Triage Note (Signed)
 Emergency Medicine Provider Triage Evaluation Note  Karen [name] , a 68 y.o. female  was evaluated in triage.  Pt complains of hematuria.  She has a history of end-stage renal disease, now status post transplant that is subsequently failed.  Dialysis since 2023.  She recently began urinating again, now over the past 3 weeks every daily urination is notable for gross hematuria.  She has discussed this with her kidney team, has not had a sample evaluated yet.  Inconsistent lower abdominal symptoms..  Review of Systems  Positive: Dysuria Negative: Fevers, chills  Physical Exam  BP (!) 169/83 (BP Location: Left Arm)   Pulse (!) 107   Temp 98.3 F (36.8 C)   Resp 15   Ht 5' (1.524 m)   Wt 56.3 kg   SpO2 96%   BMI 24.24 kg/m  Gen:   Awake, no distress speaking clearly Resp:  Normal effort no creased work of breathing MSK:   Moves extremities without difficulty no deformity Other:  Right upper extremity dialysis access unremarkable in appearance  Medical Decision Making  Medically screening exam initiated at 10:55 AM.  Appropriate orders placed.  Antwan Strathman was informed that the remainder of the evaluation will be completed by another provider, this initial triage assessment does not replace that evaluation, and the importance of remaining in the ED until their evaluation is complete.   Dorenda Gandy, MD 09/17/23 1056

## 2023-09-17 NOTE — Discharge Instructions (Addendum)
 You were seen in the ER for bloody urine.  Please call the Urologist for a close follow up. Please reach out to the Transplant team to discuss the bloody urine, as the bloody urine could be due to evolving transplant failure.

## 2023-09-17 NOTE — ED Triage Notes (Signed)
 Pt states she does not usually urinate but a couple of weeks ago she started having blood in the small amount of urine she produces. Pt denies pain or fever.

## 2023-09-18 LAB — URINE CULTURE

## 2023-11-12 ENCOUNTER — Ambulatory Visit (INDEPENDENT_AMBULATORY_CARE_PROVIDER_SITE_OTHER): Admitting: Podiatry

## 2023-11-12 ENCOUNTER — Encounter: Payer: Self-pay | Admitting: Podiatry

## 2023-11-12 DIAGNOSIS — M79674 Pain in right toe(s): Secondary | ICD-10-CM

## 2023-11-12 DIAGNOSIS — M79675 Pain in left toe(s): Secondary | ICD-10-CM

## 2023-11-12 DIAGNOSIS — B351 Tinea unguium: Secondary | ICD-10-CM

## 2023-11-12 DIAGNOSIS — I739 Peripheral vascular disease, unspecified: Secondary | ICD-10-CM

## 2023-11-17 NOTE — Progress Notes (Signed)
  Subjective:  Patient ID: Karen [name], female    DOB: Sep 05, 1955,  MRN: [id number]  68 y.o. female presents to clinic with  at risk foot care. Patient has h/o PAD and painful thick toenails that are difficult to trim. Pain interferes with ambulation. Aggravating factors include wearing enclosed shoe gear. Pain is relieved with periodic professional debridement. She also has ESRD and is on hemodialysis. Followed closely by Vascular. Chief Complaint  Patient presents with   RFC    Rm16 Routine foot care/ dialysis/ Dr. Henry     New problem(s): None   PCP is Henry Ingle, MD.  No Known Allergies  Review of Systems: Negative except as noted in the HPI.   Objective:  Karen [name] is a pleasant 68 y.o. female in NAD. AAO x 3.  Vascular Examination: CFT <4 seconds b/l. DP pulses diminished b/l. PT pulses diminished b/l. Digital hair absent. Skin temperature gradient warm to cool b/l. No ischemia or gangrene. No cyanosis or clubbing noted b/l.    Neurological Examination: Sensation grossly intact b/l with 10 gram monofilament.   Dermatological Examination: Pedal skin thin, shiny and atrophic b/l. No open wounds. No interdigital macerations.   Toenails 1-5 b/l thick, discolored, elongated with subungual debris and pain on dorsal palpation.   No hyperkeratotic nor porokeratotic lesions.  Musculoskeletal Examination: Muscle strength 5/5 to all lower extremity muscle groups bilaterally. No pain, crepitus or joint limitation noted with ROM bilateral LE. No gross bony deformities bilaterally.  Radiographs: None   Assessment:   1. Pain due to onychomycosis of toenails of both feet   2. PAD (peripheral artery disease) (HCC)    Plan:  Patient was evaluated and treated. All patient's and/or POA's questions/concerns addressed on today's visit. Toenails 1-5 debrided in length and girth without incident. Continue soft, supportive shoe gear daily. Report any pedal injuries to  medical professional. Call office if there are any questions/concerns. -Patient/POA to call should there be question/concern in the interim.  Return in about 3 months (around 02/12/2024).  Karen [name], DPM      [place] LOCATION: [address], [place] [address]                   Office [phone]   [facility] LOCATION: [address] [place], [place] [address] Office [phone]

## 2023-12-09 NOTE — Progress Notes (Unsigned)
 HISTORY AND PHYSICAL     CC:  follow up. Requesting Provider:  Gearline Norris, MD  HPI: This is a 68 y.o. female who is here today for follow up for PAD.  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   Pt was last seen 06/11/2023 and at that time, her left great toe wound had healed.  She was on asa, plavix , statin. She had a palpable left PT pulse.  She was scheduled for 6 month follow up.   The pt returns today for follow up.  ***  ESRD with dialysis access hx as follows: -PD catheter placement early 2000's -laparoscopic correction of obstructed chronic ambulatory PD catheter Dr. Lorriane 01/01/2002 - Placement of a Moncrief-Popovich chronic ambulatory PD catheter 01/01/2006 Dr. Lorriane -right BC AVF 01/24/2007 Dr. 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 Gretta -fistulogram with angioplasty of outflow cephalic vein 03/15/2023 Dr. Melia   The pt is on a statin for cholesterol management.    The pt is on an aspirin .    Other AC:  Plavix  The pt is not on medication for hypertension.  The pt is not on diabetic medication. Tobacco hx:  former  Pt does *** have family hx of AAA.  Past Medical History:  Diagnosis Date   Anemia associated with chronic renal failure    Arthritis    Convulsions/seizures (HCC) 01/28/2013   Dyslipidemia    GERD (gastroesophageal reflux disease)    Hyperlipidemia    Hypertension    Kidney transplanted    Peripheral vascular disease (HCC)    Seizure disorder (HCC)    Stroke (HCC)    possible mini stroke years ago per patient- patient no longer sees neurologist    Past Surgical History:  Procedure Laterality Date   A/V FISTULAGRAM N/A 03/15/2023   Procedure: A/V Fistulagram;  Surgeon: Melia Lynwood ORN, MD;  Location: [facility] INVASIVE CV LAB;  Service: Cardiovascular;  Laterality: N/A;   ABDOMINAL AORTOGRAM W/LOWER EXTREMITY Left 04/25/2023    Procedure: ABDOMINAL AORTOGRAM W/LOWER EXTREMITY;  Surgeon: Gretta Lonni PARAS, MD;  Location: [facility] INVASIVE CV LAB;  Service: Cardiovascular;  Laterality: Left;   CATHETER REMOVAL     COLONOSCOPY     GALLBLADDER SURGERY     INSERTION OF DIALYSIS CATHETER Left 08/11/2021   Procedure: INSERTION LEFT INTERNAL JUGULAR TUNNELED DIALYSIS CATHETER;  Surgeon: Gretta Lonni PARAS, MD;  Location: [facility] OR;  Service: Vascular;  Laterality: Left;   KIDNEY TRANSPLANT     04/10/2009   PERIPHERAL VASCULAR BALLOON ANGIOPLASTY Left 04/25/2023   Procedure: PERIPHERAL VASCULAR BALLOON ANGIOPLASTY;  Surgeon: Gretta Lonni PARAS, MD;  Location: [facility] INVASIVE CV LAB;  Service: Cardiovascular;  Laterality: Left;   REVISON OF ARTERIOVENOUS FISTULA Right 08/11/2021   Procedure: RIGHT ARM ARTERIOVENOUS FISTULA REVISION WITH ANEURYSM REMOVAL;  Surgeon: Gretta Lonni PARAS, MD;  Location: [facility] OR;  Service: Vascular;  Laterality: Right;    No Known Allergies  Current Outpatient Medications  Medication Sig Dispense Refill   aspirin  EC 81 MG tablet Take 81 mg by mouth daily.     calcitRIOL  (ROCALTROL ) 0.25 MCG capsule Take 0.25 mcg by mouth every Monday, Wednesday, and Friday.     clopidogrel  (PLAVIX ) 75 MG tablet Take 1 tablet (75 mg total) by mouth daily. 30 tablet 11   Doxercalciferol (HECTOROL IV) Doxercalciferol (Hectorol)     hydrALAZINE  (APRESOLINE ) 100 MG tablet Take 100 mg by mouth  3 (three) times daily.     magnesium  oxide (MAG-OX) 400 MG tablet Take 400 mg by mouth 2 (two) times daily.      omeprazole (PRILOSEC) 20 MG capsule Take 20 mg by mouth daily.     predniSONE  (DELTASONE ) 2.5 MG tablet Take 2.5 mg by mouth daily.     sevelamer  carbonate (RENVELA ) 800 MG tablet Take 800 mg by mouth 3 (three) times daily with meals. Take 800 mg tablet with snacks     simvastatin  (ZOCOR ) 20 MG tablet Take 20 mg by mouth every evening.     VENTOLIN  HFA 108 (90 Base) MCG/ACT inhaler Inhale 1 puff into the lungs 3 times/day as  needed-between meals & bedtime. (Patient taking differently: Inhale 1 puff into the lungs 3 times/day as needed-between meals & bedtime for shortness of breath.)     Current Facility-Administered Medications  Medication Dose Route Frequency Provider Last Rate Last Admin   sodium chloride  flush (NS) 0.9 % injection 3 mL  3 mL Intravenous Q12H Gretta Lonni PARAS, MD       sodium chloride  flush (NS) 0.9 % injection 3 mL  3 mL Intravenous PRN Gretta Lonni PARAS, MD        Family History  Problem Relation Age of Onset   Stroke Mother    Seizures Mother    Migraines Mother    Diabetes Brother    Colon cancer Neg Hx    Esophageal cancer Neg Hx    Stomach cancer Neg Hx    Rectal cancer Neg Hx     Social History   Socioeconomic History   Marital status: Single    Spouse name: Not on file   Number of children: 1   Years of education: 12   Highest education level: Not on file  Occupational History   Occupation: CNA  Tobacco Use   Smoking status: Former    Types: Cigarettes    Passive exposure: Never   Smokeless tobacco: Never   Tobacco comments:    quit smoking 25 yrs ago  Vaping Use   Vaping status: Never Used  Substance and Sexual Activity   Alcohol use: No    Comment: quit 25 years ago   Drug use: No   Sexual activity: Not on file  Other Topics Concern   Not on file  Social History Narrative   Right handed   No caffeine   One story home   Social Drivers of Health   Financial Resource Strain: Not on file  Food Insecurity: No Food Insecurity (07/15/2023)   Hunger Vital Sign    Worried About Running Out of Food in the Last Year: Never true    Ran Out of Food in the Last Year: Never true  Transportation Needs: No Transportation Needs (07/15/2023)   PRAPARE - Administrator, Civil Service (Medical): No    Lack of Transportation (Non-Medical): No  Physical Activity: Not on file  Stress: Not on file  Social Connections: Moderately Integrated (07/15/2023)    Social Connection and Isolation Panel    Frequency of Communication with Friends and Family: More than three times a week    Frequency of Social Gatherings with Friends and Family: Three times a week    Attends Religious Services: More than 4 times per year    Active Member of Clubs or Organizations: Yes    Attends Banker Meetings: More than 4 times per year    Marital Status: Never married  Catering manager  Violence: Not At Risk (07/15/2023)   Humiliation, Afraid, Rape, and Kick questionnaire    Fear of Current or Ex-Partner: No    Emotionally Abused: No    Physically Abused: No    Sexually Abused: No     REVIEW OF SYSTEMS:  *** [X]  denotes positive finding, [ ]  denotes negative finding Cardiac  Comments:  Chest pain or chest pressure:    Shortness of breath upon exertion:    Short of breath when lying flat:    Irregular heart rhythm:        Vascular    Pain in calf, thigh, or hip brought on by ambulation:    Pain in feet at night that wakes you up from your sleep:     Blood clot in your veins:    Leg swelling:         Pulmonary    Oxygen at home:    Productive cough:     Wheezing:         Neurologic    Sudden weakness in arms or legs:     Sudden numbness in arms or legs:     Sudden onset of difficulty speaking or slurred speech:    Temporary loss of vision in one eye:     Problems with dizziness:         Gastrointestinal    Blood in stool:     Vomited blood:         Genitourinary    Burning when urinating:     Blood in urine:        Psychiatric    Major depression:         Hematologic    Bleeding problems:    Problems with blood clotting too easily:        Skin    Rashes or ulcers:        Constitutional    Fever or chills:      PHYSICAL EXAMINATION:  ***  General:  WDWN in NAD; vital signs documented above Gait: Not observed HENT: WNL, normocephalic Pulmonary: normal non-labored breathing , without wheezing Cardiac: {Desc;  regular/irreg:14544} HR, {With/Without:20273} carotid bruit*** Abdomen: soft, NT; aortic pulse is *** palpable Skin: {With/Without:20273} rashes Vascular Exam/Pulses:  Right Left  Radial {Exam; arterial pulse strength 0-4:30167} {Exam; arterial pulse strength 0-4:30167}  Femoral {Exam; arterial pulse strength 0-4:30167} {Exam; arterial pulse strength 0-4:30167}  Popliteal {Exam; arterial pulse strength 0-4:30167} {Exam; arterial pulse strength 0-4:30167}  DP {Exam; arterial pulse strength 0-4:30167} {Exam; arterial pulse strength 0-4:30167}  PT {Exam; arterial pulse strength 0-4:30167} {Exam; arterial pulse strength 0-4:30167}  Peroneal *** ***   Extremities: {With/Without:20273} ischemic changes, {With/Without:20273} Gangrene , {With/Without:20273} cellulitis; {With/Without:20273} open wounds Musculoskeletal: no muscle wasting or atrophy  Neurologic: A&O X 3 Psychiatric:  The pt has {Desc; normal/abnormal:11317::Normal} affect.   Non-Invasive Vascular Imaging:   ABI's/TBI's on 12/10/2023: Right:  *** - Great toe pressure: *** Left:  *** - Great toe pressure: ***  Arterial duplex on 12/10/2023: ***  Previous ABI's/TBI's on 06/11/2023: Right:  [place]/0.68 - Great toe pressure: 91 Left:  [place]/0.69 - Great toe pressure:  92  Previous arterial duplex on 06/11/2023: +-----------+--------+-----+---------------+--------+--------+  LEFT      PSV cm/sRatioStenosis       WaveformComments  +-----------+--------+-----+---------------+--------+--------+  CFA Distal 115                         biphasic          +-----------+--------+-----+---------------+--------+--------+  DFA       199          30-49% stenosisbiphasic          +-----------+--------+-----+---------------+--------+--------+  SFA Prox   124                         biphasic          +-----------+--------+-----+---------------+--------+--------+  SFA Mid    154          30-49% stenosisbiphasic           +-----------+--------+-----+---------------+--------+--------+  SFA Distal 158          30-49% stenosisbiphasic          +-----------+--------+-----+---------------+--------+--------+  POP Prox   85                          biphasic          +-----------+--------+-----+---------------+--------+--------+  POP Distal 100                         biphasic          +-----------+--------+-----+---------------+--------+--------+  ATA Distal 78                          biphasic          +-----------+--------+-----+---------------+--------+--------+  PTA Distal 54                          biphasic          +-----------+--------+-----+---------------+--------+--------+  PERO Distal79                          biphasic          +-----------+--------+-----+---------------+--------+--------+     Summary:  Left:  - Patent femoral-popliteal arteries with 30-49% stenosis at the profunda  artery and mid-distal superficial femoral artery.  - Patent 3-vessel runoff with biphasic waveforms at distal segments.     ASSESSMENT/PLAN:: 68 y.o. 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    -*** -continue asa/statin/plavix  -discussed importance of increased walking daily -pt will f/u in *** with ***.   Lucie Apt, [facility] [phone]  Clinic MD:   Gretta

## 2023-12-10 ENCOUNTER — Other Ambulatory Visit: Payer: Self-pay

## 2023-12-10 ENCOUNTER — Ambulatory Visit (HOSPITAL_BASED_OUTPATIENT_CLINIC_OR_DEPARTMENT_OTHER)
Admission: RE | Admit: 2023-12-10 | Discharge: 2023-12-10 | Discharge status: Home / Self Care | Source: Ambulatory Visit | Attending: Physician Assistant

## 2023-12-10 ENCOUNTER — Ambulatory Visit: Attending: Vascular Surgery

## 2023-12-10 ENCOUNTER — Ambulatory Visit (HOSPITAL_COMMUNITY)
Admission: RE | Admit: 2023-12-10 | Discharge: 2023-12-10 | Disposition: A | Discharge status: Home / Self Care | Source: Ambulatory Visit | Attending: Physician Assistant | Admitting: Physician Assistant

## 2023-12-10 VITALS — BP 137/75 | Temp 98.3°F | Resp 18 | Ht 60.0 in | Wt 121.0 lb

## 2023-12-10 DIAGNOSIS — I739 Peripheral vascular disease, unspecified: Secondary | ICD-10-CM | POA: Diagnosis not present

## 2023-12-10 DIAGNOSIS — N186 End stage renal disease: Secondary | ICD-10-CM | POA: Diagnosis not present

## 2023-12-10 DIAGNOSIS — I70222 Atherosclerosis of native arteries of extremities with rest pain, left leg: Secondary | ICD-10-CM

## 2023-12-10 DIAGNOSIS — Z992 Dependence on renal dialysis: Secondary | ICD-10-CM | POA: Insufficient documentation

## 2023-12-10 LAB — VAS US ABI WITH/WO TBI

## 2023-12-19 ENCOUNTER — Other Ambulatory Visit: Payer: Self-pay

## 2023-12-19 ENCOUNTER — Encounter (HOSPITAL_COMMUNITY)
Admission: RE | Disposition: A | Discharge status: Home / Self Care | Payer: Self-pay | Source: Home / Self Care | Attending: Vascular Surgery

## 2023-12-19 ENCOUNTER — Ambulatory Visit (HOSPITAL_COMMUNITY)
Admission: RE | Admit: 2023-12-19 | Discharge: 2023-12-19 | Disposition: A | Discharge status: Home / Self Care | Attending: Vascular Surgery | Admitting: Vascular Surgery

## 2023-12-19 DIAGNOSIS — Z79899 Other long term (current) drug therapy: Secondary | ICD-10-CM | POA: Diagnosis not present

## 2023-12-19 DIAGNOSIS — Z940 Kidney transplant status: Secondary | ICD-10-CM | POA: Diagnosis not present

## 2023-12-19 DIAGNOSIS — N186 End stage renal disease: Secondary | ICD-10-CM | POA: Insufficient documentation

## 2023-12-19 DIAGNOSIS — Z87891 Personal history of nicotine dependence: Secondary | ICD-10-CM | POA: Diagnosis not present

## 2023-12-19 DIAGNOSIS — T82510A Breakdown (mechanical) of surgically created arteriovenous fistula, initial encounter: Secondary | ICD-10-CM | POA: Diagnosis present

## 2023-12-19 DIAGNOSIS — I120 Hypertensive chronic kidney disease with stage 5 chronic kidney disease or end stage renal disease: Secondary | ICD-10-CM | POA: Diagnosis not present

## 2023-12-19 DIAGNOSIS — T82898A Other specified complication of vascular prosthetic devices, implants and grafts, initial encounter: Secondary | ICD-10-CM | POA: Diagnosis not present

## 2023-12-19 DIAGNOSIS — Z7902 Long term (current) use of antithrombotics/antiplatelets: Secondary | ICD-10-CM | POA: Insufficient documentation

## 2023-12-19 DIAGNOSIS — T82858A Stenosis of vascular prosthetic devices, implants and grafts, initial encounter: Secondary | ICD-10-CM

## 2023-12-19 DIAGNOSIS — Z7982 Long term (current) use of aspirin: Secondary | ICD-10-CM | POA: Diagnosis not present

## 2023-12-19 DIAGNOSIS — Y832 Surgical operation with anastomosis, bypass or graft as the cause of abnormal reaction of the patient, or of later complication, without mention of misadventure at the time of the procedure: Secondary | ICD-10-CM | POA: Diagnosis not present

## 2023-12-19 DIAGNOSIS — Z992 Dependence on renal dialysis: Secondary | ICD-10-CM | POA: Insufficient documentation

## 2023-12-19 HISTORY — PX: A/V FISTULAGRAM: CATH118298

## 2023-12-19 LAB — POCT I-STAT, CHEM 8
BUN: 39 mg/dL — ABNORMAL HIGH (ref 8–23)
Calcium, Ion: 1.26 mmol/L (ref 1.15–1.40)
Chloride: 97 mmol/L — ABNORMAL LOW (ref 98–111)
Creatinine, Ser: 6.3 mg/dL — ABNORMAL HIGH (ref 0.44–1.00)
Glucose, Bld: 71 mg/dL (ref 70–99)
HCT: 49 % — ABNORMAL HIGH (ref 36.0–46.0)
Hemoglobin: 16.7 g/dL — ABNORMAL HIGH (ref 12.0–15.0)
Potassium: 4.7 mmol/L (ref 3.5–5.1)
Sodium: 135 mmol/L (ref 135–145)
TCO2: 33 mmol/L — ABNORMAL HIGH (ref 22–32)

## 2023-12-19 SURGERY — A/V FISTULAGRAM
Anesthesia: LOCAL

## 2023-12-19 MED ORDER — SODIUM CHLORIDE 0.9 % IV SOLN: INTRAVENOUS | Status: DC

## 2023-12-19 MED ORDER — LIDOCAINE HCL (PF) 1 % IJ SOLN
INTRAMUSCULAR | Status: AC
  Filled 2023-12-19: qty 30

## 2023-12-19 MED ORDER — IODIXANOL 320 MG/ML IV SOLN
INTRAVENOUS | Status: DC | PRN
  Administered 2023-12-19: 40 mL

## 2023-12-19 MED ORDER — HEPARIN (PORCINE) IN NACL 1000-0.9 UT/500ML-% IV SOLN
INTRAVENOUS | Status: DC | PRN
  Administered 2023-12-19: 500 mL

## 2023-12-19 SURGICAL SUPPLY — 11 items
BALLOON MUSTANG 12X60X75 (BALLOONS) IMPLANT
BALLOON MUSTANG 7.0X40 75 (BALLOONS) IMPLANT
CATH BEACON 5.038 65CM KMP-01 (CATHETERS) IMPLANT
GUIDEWIRE ANGLED .035X260CM (WIRE) IMPLANT
KIT ENCORE 26 ADVANTAGE (KITS) IMPLANT
KIT MICROPUNCTURE NIT STIFF (SHEATH) IMPLANT
PACK CARDIAC CATHETERIZATION (CUSTOM PROCEDURE TRAY) IMPLANT
SHEATH PINNACLE R/O II 6F 4CM (SHEATH) IMPLANT
SHEATH PINNACLE R/O II 7F 4CM (SHEATH) IMPLANT
SHEATH PROBE COVER 6X72 (BAG) ×2 IMPLANT
TUBING CIL FLEX 10 FLL-RA (TUBING) ×2 IMPLANT

## 2023-12-19 NOTE — H&P (Signed)
 History and Physical Interval Note:  12/19/2023 9:57 AM  Karen [name]  has presented today for surgery, with the diagnosis of esr.  The various methods of treatment have been discussed with the patient and family. After consideration of risks, benefits and other options for treatment, the patient has consented to  Procedure(s): A/V Fistulagram (N/A) as a surgical intervention.  The patient's history has been reviewed, patient examined, no change in status, stable for surgery.  I have reviewed the patient's chart and labs.  Questions were answered to the patient's satisfaction.    Right arm fistulogram  Lonni JINNY Gaskins   HISTORY AND PHYSICAL        CC:  follow up. Requesting Provider:  Gearline Norris, MD   HPI: This is a 68 y.o. female who is here today for follow up for PAD.  Pt has hx of angiogram LLE with left PTA angioplasty 04/25/2023 by Dr. Gaskins for CLI with tissue loss.  She has hx of transplant, but this did fail and she is on dialysis.    Pt was last seen 06/11/2023 and at that time, her left great toe wound had healed.  She was on asa, plavix , statin. She had a palpable left PT pulse.  She was scheduled for 6 month follow up.    The pt returns today for follow up.  She states that her toe wound healed.  She is not having any more rest pain.  She initially thought she had arthritis and would use a heating pad.  She is compliant with her asa/statin/plavix .   She states that she has some pain with her fistula that has gotten larger on her shoulder.  She has to adjust where her bra strap lays and she cannot sleep on her right side due to this.  She states this happened in the past and they did a fistulogram and it improved after ballooning.  She states the fistula is working well.  She dialyzes T/T/S at the [facility] location.     She worked at [employer] and retired in 2023 after 43 years of working there.    ESRD with dialysis access hx as  follows: -PD catheter placement early 2000's -laparoscopic correction of obstructed chronic ambulatory PD catheter Dr. Lorriane 01/01/2002 - Placement of a Moncrief-Popovich chronic ambulatory PD catheter 01/01/2006 Dr. Lorriane -right BC AVF 01/24/2007 Dr. 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 Gaskins -fistulogram with angioplasty of outflow cephalic vein 03/15/2023 Dr. Melia     The pt is on a statin for cholesterol management.    The pt is on an aspirin .    Other AC:  Plavix  The pt is not on medication for hypertension.  The pt is not on diabetic medication. Tobacco hx:  former         Past Medical History:  Diagnosis Date   Anemia associated with chronic renal failure     Arthritis     Convulsions/seizures (HCC) 01/28/2013   Dyslipidemia     GERD (gastroesophageal reflux disease)     Hyperlipidemia     Hypertension     Kidney transplanted     Peripheral vascular disease (HCC)     Seizure disorder (HCC)     Stroke (HCC)      possible mini stroke years ago per patient- patient no longer sees neurologist               Past Surgical History:  Procedure  Laterality Date   A/V FISTULAGRAM N/A 03/15/2023    Procedure: A/V Fistulagram;  Surgeon: Melia Lynwood ORN, MD;  Location: [facility] INVASIVE CV LAB;  Service: Cardiovascular;  Laterality: N/A;   ABDOMINAL AORTOGRAM W/LOWER EXTREMITY Left 04/25/2023    Procedure: ABDOMINAL AORTOGRAM W/LOWER EXTREMITY;  Surgeon: Gretta Lonni PARAS, MD;  Location: [facility] INVASIVE CV LAB;  Service: Cardiovascular;  Laterality: Left;   CATHETER REMOVAL       COLONOSCOPY       GALLBLADDER SURGERY       INSERTION OF DIALYSIS CATHETER Left 08/11/2021    Procedure: INSERTION LEFT INTERNAL JUGULAR TUNNELED DIALYSIS CATHETER;  Surgeon: Gretta Lonni PARAS, MD;  Location: [facility] OR;  Service: Vascular;  Laterality: Left;   KIDNEY TRANSPLANT        04/10/2009   PERIPHERAL VASCULAR BALLOON ANGIOPLASTY Left  04/25/2023    Procedure: PERIPHERAL VASCULAR BALLOON ANGIOPLASTY;  Surgeon: Gretta Lonni PARAS, MD;  Location: [facility] INVASIVE CV LAB;  Service: Cardiovascular;  Laterality: Left;   REVISON OF ARTERIOVENOUS FISTULA Right 08/11/2021    Procedure: RIGHT ARM ARTERIOVENOUS FISTULA REVISION WITH ANEURYSM REMOVAL;  Surgeon: Gretta Lonni PARAS, MD;  Location: [facility] OR;  Service: Vascular;  Laterality: Right;          Allergies  No Known Allergies           Current Outpatient Medications  Medication Sig Dispense Refill   aspirin  EC 81 MG tablet Take 81 mg by mouth daily.       calcitRIOL  (ROCALTROL ) 0.25 MCG capsule Take 0.25 mcg by mouth every Monday, Wednesday, and Friday.       clopidogrel  (PLAVIX ) 75 MG tablet Take 1 tablet (75 mg total) by mouth daily. 30 tablet 11   Doxercalciferol (HECTOROL IV) Doxercalciferol (Hectorol)       hydrALAZINE  (APRESOLINE ) 100 MG tablet Take 100 mg by mouth 3 (three) times daily.       magnesium  oxide (MAG-OX) 400 MG tablet Take 400 mg by mouth 2 (two) times daily.        omeprazole (PRILOSEC) 20 MG capsule Take 20 mg by mouth daily.       predniSONE  (DELTASONE ) 2.5 MG tablet Take 2.5 mg by mouth daily.       sevelamer  carbonate (RENVELA ) 800 MG tablet Take 800 mg by mouth 3 (three) times daily with meals. Take 800 mg tablet with snacks       simvastatin  (ZOCOR ) 20 MG tablet Take 20 mg by mouth every evening.       VENTOLIN  HFA 108 (90 Base) MCG/ACT inhaler Inhale 1 puff into the lungs 3 times/day as needed-between meals & bedtime. (Patient taking differently: Inhale 1 puff into the lungs 3 times/day as needed-between meals & bedtime for shortness of breath.)                   Current Facility-Administered Medications  Medication Dose Route Frequency Provider Last Rate Last Admin   sodium chloride  flush (NS) 0.9 % injection 3 mL  3 mL Intravenous Q12H Gretta Lonni PARAS, MD       sodium chloride  flush (NS) 0.9 % injection 3 mL  3 mL Intravenous PRN Gretta Lonni PARAS, MD                 Family History  Problem Relation Age of Onset   Stroke Mother     Seizures Mother     Migraines Mother     Diabetes Brother     Colon cancer  Neg Hx     Esophageal cancer Neg Hx     Stomach cancer Neg Hx     Rectal cancer Neg Hx            Social History         Socioeconomic History   Marital status: Single      Spouse name: Not on file   Number of children: 1   Years of education: 12   Highest education level: Not on file  Occupational History   Occupation: CNA  Tobacco Use   Smoking status: Former      Types: Cigarettes      Passive exposure: Never   Smokeless tobacco: Never   Tobacco comments:      quit smoking 25 yrs ago  Vaping Use   Vaping status: Never Used  Substance and Sexual Activity   Alcohol use: No      Comment: quit 25 years ago   Drug use: No   Sexual activity: Not on file  Other Topics Concern   Not on file  Social History Narrative    Right handed    No caffeine    One story home    Social Drivers of Health        Financial Resource Strain: Not on file  Food Insecurity: No Food Insecurity (07/15/2023)    Hunger Vital Sign     Worried About Running Out of Food in the Last Year: Never true     Ran Out of Food in the Last Year: Never true  Transportation Needs: No Transportation Needs (07/15/2023)    PRAPARE - Therapist, art (Medical): No     Lack of Transportation (Non-Medical): No  Physical Activity: Not on file  Stress: Not on file  Social Connections: Moderately Integrated (07/15/2023)    Social Connection and Isolation Panel     Frequency of Communication with Friends and Family: More than three times a week     Frequency of Social Gatherings with Friends and Family: Three times a week     Attends Religious Services: More than 4 times per year     Active Member of Clubs or Organizations: Yes     Attends Banker Meetings: More than 4 times per year      Marital Status: Never married  Intimate Partner Violence: Not At Risk (07/15/2023)    Humiliation, Afraid, Rape, and Kick questionnaire     Fear of Current or Ex-Partner: No     Emotionally Abused: No     Physically Abused: No     Sexually Abused: No        REVIEW OF SYSTEMS:    [X]  denotes positive finding, [ ]  denotes negative finding Cardiac   Comments:  Chest pain or chest pressure:      Shortness of breath upon exertion:      Short of breath when lying flat:      Irregular heart rhythm:             Vascular      Pain in calf, thigh, or hip brought on by ambulation:      Pain in feet at night that wakes you up from your sleep:       Blood clot in your veins:      Leg swelling:              Pulmonary      Oxygen at home:  Productive cough:       Wheezing:              Neurologic      Sudden weakness in arms or legs:       Sudden numbness in arms or legs:       Sudden onset of difficulty speaking or slurred speech:      Temporary loss of vision in one eye:       Problems with dizziness:              Gastrointestinal      Blood in stool:       Vomited blood:              Genitourinary      Burning when urinating:       Blood in urine:             Psychiatric      Major depression:              Hematologic      Bleeding problems:      Problems with blood clotting too easily:             Skin      Rashes or ulcers:             Constitutional      Fever or chills:          PHYSICAL EXAMINATION:      Today's Vitals    12/10/23 1423  BP: 137/75  Resp: 18  Temp: 98.3 F (36.8 C)  TempSrc: Temporal  Weight: 121 lb 0.5 oz (54.9 kg)  Height: 5' (1.524 m)  PainSc: 0-No pain    Body mass index is 23.64 kg/m.     General:  WDWN in NAD; vital signs documented above Gait: Not observed HENT: WNL, normocephalic Pulmonary: normal non-labored breathing , without wheezing Cardiac: regular HR, right carotid bruit most likely radiating from fistula  right arm/shoulder Abdomen: soft, NT; aortic pulse is not palpable Skin: without rashes Vascular Exam/Pulses: Bilateral radial arteries palpable; left DP pulse is palpable with brisk doppler flow left DP/PT; brisk right DP>PT; right arm fistula with aneurysmal changes on the right shoulder.  Does not feel pulsatile and has a good thrill present.  Extremities: without ischemic changes, without Gangrene , without cellulitis; without open wounds Musculoskeletal: no muscle wasting or atrophy       Neurologic: A&O X 3 Psychiatric:  The pt has Normal affect.     Non-Invasive Vascular Imaging:   ABI's/TBI's on 12/10/2023: Right:  [place]/0.60 - Great toe pressure: 68 Left:  [place]/0.51 - Great toe pressure: 58   Arterial duplex on 12/10/2023: +-----------+--------+-----+--------+--------+-------------------+  LEFT      PSV cm/sRatioStenosisWaveformComments             +-----------+--------+-----+--------+--------+-------------------+  CFA Mid    149                  biphasic                     +-----------+--------+-----+--------+--------+-------------------+  DFA       109                  biphasic                     +-----------+--------+-----+--------+--------+-------------------+  SFA Prox   127  biphasic                     +-----------+--------+-----+--------+--------+-------------------+  SFA Mid    90                   biphasic                     +-----------+--------+-----+--------+--------+-------------------+  SFA Distal 91                   biphasic                     +-----------+--------+-----+--------+--------+-------------------+  POP Prox   70                   biphasic                     +-----------+--------+-----+--------+--------+-------------------+  POP Distal 82                   biphasic                     +-----------+--------+-----+--------+--------+-------------------+  ATA Prox                                 Not visualized well  +-----------+--------+-----+--------+--------+-------------------+  ATA Mid                                 Not visualized well  +-----------+--------+-----+--------+--------+-------------------+  ATA Distal                              Not visualized well  +-----------+--------+-----+--------+--------+-------------------+  PTA Prox                                Not visualized well  +-----------+--------+-----+--------+--------+-------------------+  PTA Mid                                 Not visualized well  +-----------+--------+-----+--------+--------+-------------------+  PTA Distal                              Not visualized well  +-----------+--------+-----+--------+--------+-------------------+  PERO Prox                               Not visualized well  +-----------+--------+-----+--------+--------+-------------------+  PERO Mid                                Not visualized well  +-----------+--------+-----+--------+--------+-------------------+  PERO Distal                             Not visualized well  +-----------+--------+-----+--------+--------+-------------------+     Summary:  Left: Largely biphasic flow is visualized from the groin to knee. Calf vessels are not well visualized. Atherosclerosis is seen throughout the left lower extremity.    Previous ABI's/TBI's on 06/11/2023: Right:  [place]/0.68 - Great toe pressure: 91 Left:  [place]/0.69 - Great  toe pressure:  92   Previous arterial duplex on 06/11/2023: +-----------+--------+-----+---------------+--------+--------+  LEFT      PSV cm/sRatioStenosis       WaveformComments  +-----------+--------+-----+---------------+--------+--------+  CFA Distal 115                         biphasic          +-----------+--------+-----+---------------+--------+--------+  DFA       199          30-49% stenosisbiphasic           +-----------+--------+-----+---------------+--------+--------+  SFA Prox   124                         biphasic          +-----------+--------+-----+---------------+--------+--------+  SFA Mid    154          30-49% stenosisbiphasic          +-----------+--------+-----+---------------+--------+--------+  SFA Distal 158          30-49% stenosisbiphasic          +-----------+--------+-----+---------------+--------+--------+  POP Prox   85                          biphasic          +-----------+--------+-----+---------------+--------+--------+  POP Distal 100                         biphasic          +-----------+--------+-----+---------------+--------+--------+  ATA Distal 78                          biphasic          +-----------+--------+-----+---------------+--------+--------+  PTA Distal 54                          biphasic          +-----------+--------+-----+---------------+--------+--------+  PERO Distal79                          biphasic          +-----------+--------+-----+---------------+--------+--------+     Summary:  Left:  - Patent femoral-popliteal arteries with 30-49% stenosis at the profunda  artery and mid-distal superficial femoral artery.  - Patent 3-vessel runoff with biphasic waveforms at distal segments.        ASSESSMENT/PLAN:: 68 y.o. 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    PAD -pt left great toe wound has healed.  She does not have rest pain or claudication -continue asa/statin/plavix  -pt will f/u in 6 months with ABI and LLE arterial duplex.  If no issues, she can then follow up yearly.    ESRD -right arm fistula with aneurysmal areas at the shoulder.  She has had this in the past and improved with ballooning of the cephalic outflow vein.  Pt seen with Dr. Gretta and will proceed with fistulogram.  He  discussed with her that if there is an area to balloon, he would do it at that time.  If there is not, may need surgical plication.  She will be set up with Dr. Gretta on a Thursday in the [facility] lab.  She knows she  will need to change her dialysis day.   Of note, she is right handed.      Lucie Apt, [facility] [phone]   Clinic MD:   Gretta

## 2023-12-19 NOTE — Op Note (Signed)
    Patient name: Karen [name] MRN: [id number] DOB: 1955-06-16 Sex: female  12/19/2023 Pre-operative Diagnosis: Malfunctioning right arm AV fistula Post-operative diagnosis:  Same Surgeon:  Lonni DOROTHA Gaskins, MD Procedure Performed: 1.  Ultrasound-guided access right arm AV fistula 2.  Right upper extremity fistulogram including central venogram 3.  Right central angioplasty innominate vein (12 mm Mustang) 4.  Right peripheral angioplasty cephalic arch (7 mm and 12 mm Mustang)  Indications: 68 year old female presents for right arm fistulogram due to malfunction of right brachiocephalic AV fistula.  Risk-benefits discussed.  Findings:   Ultrasound guided access right arm AV fistula.  Centrally there is about a 50% stenosis in the innominate vein that was treated with a 12 mm Mustang.  The fistula in the upper extremity was quite tortuous and aneurysmal.  There was an area in the cephalic arch with another 50% stenosis that was treated initially with a 7 mm Mustang but this was very undersized so I ended up using a 12 mm Mustang.  No significant residual stenosis.  Excellent thrill at completion.    Procedure:  The patient was identified in the holding area and taken to room 8.  Placed on the table in the supine position.  The right arm was prepped draped standard sterile fashion.  Timeouts performed.  I used ultrasound evaluated the right arm fistula and this was accessed with micro access needle, placed a microwire, and a micro sheath.  We then got a right upper extremity fistulogram including central venogram.  Elected for intervention.  I used a Glidewire and upsized to a 6 [place] sheath.  Initially used a 7 mm Mustang with plans to treat the cephalic arch and also the elected to treat this at the central vein.  This was undersized.  I then upsized to a  7 [place] sheath and used a 12 mm Mustang treated the innominate vein as well as the cephalic arch for 2 minutes in each segment.  Much  better results.  Wires catheters removed.  Taken to holding in stable condition.     Lonni DOROTHA Gaskins, MD [facility] [place] Office: [phone]

## 2023-12-20 ENCOUNTER — Encounter (HOSPITAL_COMMUNITY): Payer: Self-pay | Admitting: Vascular Surgery

## 2024-02-25 ENCOUNTER — Ambulatory Visit: Admitting: Podiatry

## 2024-02-25 DIAGNOSIS — I739 Peripheral vascular disease, unspecified: Secondary | ICD-10-CM | POA: Diagnosis not present

## 2024-02-25 DIAGNOSIS — N186 End stage renal disease: Secondary | ICD-10-CM

## 2024-02-25 DIAGNOSIS — B351 Tinea unguium: Secondary | ICD-10-CM | POA: Diagnosis not present

## 2024-02-25 DIAGNOSIS — M79674 Pain in right toe(s): Secondary | ICD-10-CM | POA: Diagnosis not present

## 2024-02-25 DIAGNOSIS — M79675 Pain in left toe(s): Secondary | ICD-10-CM | POA: Diagnosis not present

## 2024-02-25 DIAGNOSIS — Z992 Dependence on renal dialysis: Secondary | ICD-10-CM

## 2024-03-01 ENCOUNTER — Encounter: Payer: Self-pay | Admitting: Podiatry

## 2024-03-01 NOTE — Progress Notes (Signed)
  Subjective:  Patient ID: Karen [name], female    DOB: Feb 15, 1956,  MRN: [id number]  Karen [name] presents to clinic today for at risk foot care. Patient has h/o ESRD on hemodialysis and painful mycotic toenails of both feet that are difficult to trim. Pain interferes with daily activities and wearing enclosed shoe gear comfortably.  Chief Complaint  Patient presents with   RFC    RFC Not diabetic.  Henry Ingle, MD (PCP) March, 2025    New problem(s): None.   PCP is Henry Ingle, MD.  No Known Allergies  Review of Systems: Negative except as noted in the HPI.  Objective: No changes noted in today's physical examination. There were no vitals filed for this visit. Karen [name] is a pleasant 68 y.o. female in NAD. AAO x 3.  Vascular Examination: CFT <4 seconds b/l. DP pulses diminished b/l. PT pulses diminished b/l. Digital hair absent. Skin temperature gradient warm to cool b/l. No ischemia or gangrene. No cyanosis or clubbing noted b/l.    Neurological Examination: Sensation grossly intact b/l with 10 gram monofilament.   Dermatological Examination: Pedal skin thin, shiny and atrophic b/l. No open wounds. No interdigital macerations.   Toenails 1-5 b/l thick, discolored, elongated with subungual debris and pain on dorsal palpation.   No hyperkeratotic nor porokeratotic lesions.  Musculoskeletal Examination: Muscle strength 5/5 to all lower extremity muscle groups bilaterally. No pain, crepitus or joint limitation noted with ROM bilateral LE. No gross bony deformities bilaterally.  Radiographs: None  Assessment/Plan: 1. Pain due to onychomycosis of toenails of both feet   2. PAD (peripheral artery disease)   3. ESRD on hemodialysis [facility])   Patient was evaluated and treated. All patient's and/or POA's questions/concerns addressed on today's visit. Mycotic toenails 1-5 b/l debrided in length and girth without incident.  Continue daily foot inspections and  monitor blood glucose per PCP/Endocrinologist's recommendations.Continue soft, supportive shoe gear daily. Report any pedal injuries to medical professional. Call office if there are any quesitons/concerns. -Patient/POA to call should there be question/concern in the interim.   Return in about 3 months (around 05/27/2024).  Delon LITTIE Merlin, DPM      [place] LOCATION: [address] [place] [address]                   Office [phone]   [facility] LOCATION: [address] [place], [place] [address] Office [phone]

## 2024-04-08 ENCOUNTER — Other Ambulatory Visit: Payer: Self-pay | Admitting: Vascular Surgery

## 2024-04-09 NOTE — Progress Notes (Signed)
 "     Cardiology Office Note Date:  04/10/2024  ID:  Karen [name], DOB 09-03-1955, MRN [id number] PCP:  Henry Ingle, MD  Cardiologist:  Joelle VEAR Ren Donley, MD  No chief complaint on file.    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  History of Present Illness   Karen [name] is a 69 year old female who presents with dizziness and lightheadedness. She has had dizziness and lightheadedness for over a month, about twice a week, sometimes after dialysis and sometimes on non-dialysis days. During episodes she feels she might fall, with leg numbness and occasional blurry vision that makes walking in her house difficult. For the last four weeks during dialysis treatments, her heart rate has been in the 40s and her blood pressure often around 100/70 mmHg, sometimes dropping into the 70s and requiring dialysis to be stopped. She was started on midodrine about 3-4 weeks and supposed to take 5 mg but has been taking midodrine 10 mg on dialysis days, 5 mg at 5:30 AM and 5 mg at 7:30 AM, to support blood pressure. She has low blood pressure and hydralazine  was stopped four months ago, and amlodipine  three weeks ago. Her BP is monitored at the dialysis center. She has no chest pain or shortness of breath. She smoked in her teens but does not currently smoke or drink alcohol. Her mother had high blood pressure.      ROS: Please see the history of present illness. All other systems  are reviewed and negative.   Past Medical History:  Diagnosis Date   Anemia associated with chronic renal failure    Arthritis    Convulsions/seizures (HCC) 01/28/2013   Dyslipidemia    GERD (gastroesophageal reflux disease)    Hyperlipidemia    Hypertension    Kidney transplanted    Peripheral vascular disease    Seizure disorder (HCC)    Stroke (HCC)    possible mini stroke years ago per patient- patient no longer sees neurologist    Past Surgical History:  Procedure Laterality Date   A/V FISTULAGRAM N/A 03/15/2023   Procedure: A/V Fistulagram;  Surgeon: Melia Lynwood ORN, MD;  Location: [facility] INVASIVE CV LAB;  Service: Cardiovascular;  Laterality: N/A;   A/V FISTULAGRAM N/A 12/19/2023   Procedure: A/V Fistulagram;  Surgeon: Gretta Lonni PARAS, MD;  Location: [facility] INVASIVE CV LAB;  Service: Cardiovascular;  Laterality: N/A;   ABDOMINAL AORTOGRAM W/LOWER EXTREMITY Left 04/25/2023   Procedure: ABDOMINAL AORTOGRAM W/LOWER EXTREMITY;  Surgeon: Gretta Lonni PARAS, MD;  Location: [facility] INVASIVE CV LAB;  Service: Cardiovascular;  Laterality: Left;   CATHETER REMOVAL     COLONOSCOPY     GALLBLADDER SURGERY     INSERTION OF DIALYSIS CATHETER Left 08/11/2021   Procedure: INSERTION LEFT INTERNAL JUGULAR TUNNELED DIALYSIS CATHETER;  Surgeon: Gretta Lonni PARAS, MD;  Location: [facility] OR;  Service: Vascular;  Laterality: Left;   KIDNEY TRANSPLANT     04/10/2009   PERIPHERAL VASCULAR BALLOON  ANGIOPLASTY Left 04/25/2023   Procedure: PERIPHERAL VASCULAR BALLOON ANGIOPLASTY;  Surgeon: Gretta Lonni PARAS, MD;  Location: [facility] INVASIVE CV LAB;  Service: Cardiovascular;  Laterality: Left;   REVISON OF ARTERIOVENOUS FISTULA Right 08/11/2021   Procedure: RIGHT ARM ARTERIOVENOUS FISTULA REVISION WITH ANEURYSM REMOVAL;  Surgeon: Gretta Lonni PARAS, MD;  Location: [facility] OR;  Service: Vascular;  Laterality: Right;    Current Outpatient Medications  Medication Sig Dispense Refill   amLODipine  (NORVASC ) 5 MG tablet  Take 1 tablet by mouth. (Patient taking differently: Take 1 tablet by mouth daily. As needed)     aspirin  EC 81 MG tablet Take 81 mg by mouth daily.     calcitRIOL  (ROCALTROL ) 0.25 MCG capsule Take 0.25 mcg by mouth every Monday, Wednesday, and Friday.     clopidogrel  (PLAVIX ) 75 MG tablet TAKE 1 TABLET BY MOUTH EVERY DAY 90 tablet 3   hydrALAZINE  (APRESOLINE ) 100 MG tablet Take 100 mg by mouth 2 (two) times daily as needed (blood pressure over 157).     magnesium  oxide (MAG-OX) 400 MG tablet Take 400 mg by mouth 2 (two) times daily.      midodrine (PROAMATINE) 5 MG tablet Take 5 mg by mouth 3 (three) times a week.     omeprazole (PRILOSEC) 20 MG capsule Take 20 mg by mouth daily.     predniSONE  (DELTASONE ) 2.5 MG tablet Take 2.5 mg by mouth daily.     sevelamer  carbonate (RENVELA ) 800 MG tablet Take 800 mg by mouth daily.     simvastatin  (ZOCOR ) 20 MG tablet Take 20 mg by mouth every evening.     VENTOLIN  HFA 108 (90 Base) MCG/ACT inhaler Inhale 1 puff into the lungs 3 times/day as needed-between meals & bedtime.     Current Facility-Administered Medications  Medication Dose Route Frequency Provider Last Rate Last Admin   sodium chloride  flush (NS) 0.9 % injection 3 mL  3 mL Intravenous Q12H Gretta Lonni PARAS, MD       sodium chloride  flush (NS) 0.9 % injection 3 mL  3 mL Intravenous PRN Gretta Lonni PARAS, MD        Allergies:   Patient has no known allergies.   Social History:  see above  Family History:  see above  PHYSICAL EXAM: VS:  BP (!) 74/60   Pulse 89   Ht 5' (1.524 m)   Wt 129 lb 6.4 oz (58.7 kg)   SpO2 93%   BMI 25.27 kg/m  , BMI Body mass index is 25.27 kg/m. GEN: Well nourished, well developed, in no acute distress HEENT: normal Neck: no JVD, carotid bruits, or masses Cardiac: RRR; no murmurs, rubs, or gallops,no edema  Respiratory:  CTAB bilaterally, normal work of breathing GI: soft, nontender, nondistended, + BS Extremities: No LE edema Skin: warm and  dry, no rash Neuro:  Strength and sensation are intact  EKG: NSR  Recent Labs: Reviewed  Studies: Reviewed  ASSESSMENT AND PLAN: Karen [name] is a 69 y.o. female who presents for new visit.     Bradycardia with hypotension and dizziness Bradycardia with heart rate in the 40s and hypotension during dialysis causing dizziness and lightheadedness. Midodrine provided some relief. Concern for serious underlying condition due to low blood pressure. - Ordered 14-day heart monitor to assess for abnormal heart rhythms. - Advised emergency room visit for evaluation of low blood pressure and potential serious conditions. - Ordered echocardiogram to assess cardiac function. - Continue midodrine as needed for blood pressure support.  End stage  renal disease on dialysis End stage renal disease managed with dialysis. Blood pressure fluctuations noted during dialysis. - Continue current dialysis regimen. - Follow up with dialysis team for ongoing management.      Signed, Joelle VEAR Ren Donley, MD  04/10/2024 8:20 AM    [facility] "

## 2024-04-10 ENCOUNTER — Other Ambulatory Visit: Payer: Self-pay

## 2024-04-10 ENCOUNTER — Ambulatory Visit

## 2024-04-10 ENCOUNTER — Encounter (HOSPITAL_COMMUNITY): Payer: Self-pay

## 2024-04-10 ENCOUNTER — Emergency Department (HOSPITAL_COMMUNITY): Admission: EM | Admit: 2024-04-10 | Discharge: 2024-04-10 | Disposition: A | Discharge status: Home / Self Care

## 2024-04-10 VITALS — BP 74/60 | HR 89 | Ht 60.0 in | Wt 129.4 lb

## 2024-04-10 DIAGNOSIS — I959 Hypotension, unspecified: Secondary | ICD-10-CM | POA: Insufficient documentation

## 2024-04-10 DIAGNOSIS — R918 Other nonspecific abnormal finding of lung field: Secondary | ICD-10-CM

## 2024-04-10 DIAGNOSIS — I2510 Atherosclerotic heart disease of native coronary artery without angina pectoris: Secondary | ICD-10-CM

## 2024-04-10 DIAGNOSIS — R001 Bradycardia, unspecified: Secondary | ICD-10-CM

## 2024-04-10 DIAGNOSIS — Z992 Dependence on renal dialysis: Secondary | ICD-10-CM | POA: Diagnosis not present

## 2024-04-10 DIAGNOSIS — Z7982 Long term (current) use of aspirin: Secondary | ICD-10-CM | POA: Diagnosis not present

## 2024-04-10 DIAGNOSIS — N186 End stage renal disease: Secondary | ICD-10-CM | POA: Diagnosis present

## 2024-04-10 DIAGNOSIS — Z7902 Long term (current) use of antithrombotics/antiplatelets: Secondary | ICD-10-CM | POA: Insufficient documentation

## 2024-04-10 DIAGNOSIS — I10 Essential (primary) hypertension: Secondary | ICD-10-CM

## 2024-04-10 DIAGNOSIS — I739 Peripheral vascular disease, unspecified: Secondary | ICD-10-CM | POA: Diagnosis present

## 2024-04-10 LAB — COMPREHENSIVE METABOLIC PANEL WITH GFR
ALT: <5 U/L (ref 0–44)
AST: 20 U/L (ref 15–41)
Albumin: 4.3 g/dL (ref 3.5–5.0)
Alkaline Phosphatase: 121 U/L (ref 38–126)
Anion gap: 15 (ref 5–15)
BUN: 36 mg/dL — ABNORMAL HIGH (ref 8–23)
CO2: 26 mmol/L (ref 22–32)
Calcium: 10.4 mg/dL — ABNORMAL HIGH (ref 8.9–10.3)
Chloride: 96 mmol/L — ABNORMAL LOW (ref 98–111)
Creatinine, Ser: 7.93 mg/dL — ABNORMAL HIGH (ref 0.44–1.00)
GFR, Estimated: 5 mL/min — ABNORMAL LOW
Glucose, Bld: 83 mg/dL (ref 70–99)
Potassium: 4.9 mmol/L (ref 3.5–5.1)
Sodium: 137 mmol/L (ref 135–145)
Total Bilirubin: 0.8 mg/dL (ref 0.0–1.2)
Total Protein: 8.6 g/dL — ABNORMAL HIGH (ref 6.5–8.1)

## 2024-04-10 LAB — CBC
HCT: 49.2 % — ABNORMAL HIGH (ref 36.0–46.0)
Hemoglobin: 15.7 g/dL — ABNORMAL HIGH (ref 12.0–15.0)
MCH: 34.4 pg — ABNORMAL HIGH (ref 26.0–34.0)
MCHC: 31.9 g/dL (ref 30.0–36.0)
MCV: 107.9 fL — ABNORMAL HIGH (ref 80.0–100.0)
Platelets: 170 K/uL (ref 150–400)
RBC: 4.56 MIL/uL (ref 3.87–5.11)
RDW: 16 % — ABNORMAL HIGH (ref 11.5–15.5)
WBC: 7.1 K/uL (ref 4.0–10.5)
nRBC: 0 % (ref 0.0–0.2)

## 2024-04-10 LAB — CBG MONITORING, ED: Glucose-Capillary: 93 mg/dL (ref 70–99)

## 2024-04-10 LAB — PROTIME-INR
INR: 1.2 (ref 0.8–1.2)
Prothrombin Time: 16 s — ABNORMAL HIGH (ref 11.4–15.2)

## 2024-04-10 NOTE — ED Notes (Signed)
 Pt denies numbness, tingling, weakness, sob and headache.

## 2024-04-10 NOTE — ED Triage Notes (Signed)
 Pt just left her cardiologist where her BP 74/60. Pt states she was dizzy prior to arrival. Pt was seeing the cardiologist d/t bradycardia (40's).   Pt states her BP medications have been on pause for past three months.   Pt states her dry weight was changed a week ago.   HD Tues/Thurs/Sat

## 2024-04-10 NOTE — Patient Instructions (Signed)
 Medication Instructions:  Your physician recommends that you continue on your current medications as directed. Please refer to the Current Medication list given to you today.  *If you need a refill on your cardiac medications before your next appointment, please call your pharmacy*  Lab Work: None  If you have labs (blood work) drawn today and your tests are completely normal, you will receive your results only by: MyChart Message (if you have MyChart) OR A paper copy in the mail If you have any lab test that is abnormal or we need to change your treatment, we will call you to review the results.  Testing/Procedures: Echocardiogram Your physician has requested that you have an echocardiogram. Echocardiography is a painless test that uses sound waves to create images of your heart. It provides your doctor with information about the size and shape of your heart and how well your hearts chambers and valves are working. This procedure takes approximately one hour. There are no restrictions for this procedure. Please do NOT wear cologne, perfume, aftershave, or lotions (deodorant is allowed). Please arrive 15 minutes prior to your appointment time.  Please note: We ask at that you not bring children with you during ultrasound (echo/ vascular) testing. Due to room size and safety concerns, children are not allowed in the ultrasound rooms during exams. Our front office staff cannot provide observation of children in our lobby area while testing is being conducted. An adult accompanying a patient to their appointment will only be allowed in the ultrasound room at the discretion of the ultrasound technician under special circumstances. We apologize for any inconvenience.   Zio Monitor ZIO XT- Long Term Monitor Instructions  Your physician has requested you wear a ZIO patch monitor for ____ days.   This is a single patch monitor. [employer] supplies one patch monitor per enrollment. Additional  stickers  are not available. Please do not apply patch if you will be having a Nuclear Stress Test,  Echocardiogram, Cardiac CT, MRI, or Chest Xray during the period you would be wearing the  monitor. The patch cannot be worn during these tests. You cannot remove and re-apply the  ZIO XT patch monitor.   Your ZIO patch monitor will be mailed 3 day USPS to your address on file. It may take 3-5 days  to receive your monitor after you have been enrolled.   Once you have received your monitor, please review the enclosed instructions. Your monitor  has already been registered assigning a specific monitor serial # to you.     Billing and Patient Assistance Program Information  We have supplied [employer] with any of your insurance information on file for billing purposes.  [employer] offers a sliding scale Patient Assistance Program for patients that do not have  insurance, or whose insurance does not completely cover the cost of the ZIO monitor.  You must apply for the Patient Assistance Program to qualify for this discounted rate.   To apply, please call [employer] at [phone], select option 4, select option 2, ask to apply for  Patient Assistance Program. Meredeth will ask your household income, and how many people  are in your household. They will quote your out-of-pocket cost based on that information.  [employer] will also be able to set up a 51-month, interest-free payment plan if needed.     Applying the monitor  Shave hair from upper left chest.  Hold abrader disc by orange tab. Rub abrader in 40 strokes over the upper left chest as  indicated in your monitor instructions.  Clean area with 4 enclosed alcohol pads. Let dry.  Apply patch as indicated in monitor instructions. Patch will be placed under collarbone on left  side of chest with arrow pointing upward.  Rub patch adhesive wings for 2 minutes. Remove white label marked 1. Remove the white  label marked 2. Rub patch adhesive wings for 2  additional minutes.  While looking in a mirror, press and release button in center of patch. A small green light will  flash 3-4 times. This will be your only indicator that the monitor has been turned on.  Do not shower for the first 24 hours. You may shower after the first 24 hours.  Press the button if you feel a symptom. You will hear a small click. Record Date, Time and  Symptom in the Patient Logbook.  When you are ready to remove the patch, follow instructions on the last 2 pages of Patient  Logbook. Stick patch monitor onto the last page of Patient Logbook.   Place Patient Logbook in the blue and white box. Use locking tab on box and tape box closed  securely. The blue and white box has prepaid postage on it. Please place it in the mailbox as  soon as possible. Your physician should have your test results approximately 7 days after the  monitor has been mailed back to [facility].   Call [facility] Customer Care at [phone] if you have questions regarding  your ZIO XT patch monitor. Call them immediately if you see an orange light blinking on your  monitor.   If your monitor falls off in less than 4 days, contact our Monitor department at [phone].   If your monitor becomes loose or falls off after 4 days call [employer] at [phone] for  suggestions on securing your monitor.     Follow-Up: At [facility], you and your health needs are our priority.  As part of our continuing mission to provide you with exceptional heart care, our providers are all part of one team.  This team includes your primary Cardiologist (physician) and Advanced Practice Providers or APPs (Physician Assistants and Nurse Practitioners) who all work together to provide you with the care you need, when you need it.  Your next appointment:   3 month(s)  Provider:   One of our Advanced Practice Providers (APPs): Morse Clause, PA-C  Lamarr Satterfield, NP Miriam Shams, NP  Olivia Pavy, PA-C Josefa Beauvais, NP  Leontine Salen, PA-C Orren Fabry, PA-C  Hao Meng, PA-C Ernest Dick, NP  Damien Braver, NP Jon Hails, PA-C  Waddell Donath, PA-C    Dayna Dunn, PA-C  Scott Weaver, PA-C Lum Louis, NP Katlyn West, NP Callie Goodrich, PA-C  Xika Zhao, NP Sheng Haley, PA-C    Kathleen Johnson, PA-C       We recommend signing up for the patient portal called MyChart.  Sign up information is provided on this After Visit Summary.  MyChart is used to connect with patients for Virtual Visits (Telemedicine).  Patients are able to view lab/test results, encounter notes, upcoming appointments, etc.  Non-urgent messages can be sent to your provider as well.   To learn more about what you can do with MyChart, go to [email or web address].   Other Instructions Patient is going to the ED accompanied by her brother.

## 2024-04-10 NOTE — ED Notes (Signed)
 Assumed care of pt, after report found that pt was no longer in the room.  Staff stated that pt had left.  Will be discharged from the system.

## 2024-04-10 NOTE — ED Provider Notes (Signed)
 " [place] EMERGENCY DEPARTMENT AT [facility] Provider Note   CSN: [id number] Arrival date & time: 04/10/24  0847     Patient presents with: Hypotension   Karen [name] is a 69 y.o. female with a history of stroke, seizure disorder, kidney transplant, GERD, renal failure currently scheduled for dialysis Tuesday/Thursday/Saturday, who presents to the emergency department for evaluation of hypotension.  Patient states that she was seen at her cardiologist this morning and upon checking her vitals blood pressure was 70/40.  Patient states that she had some mild dizziness associated with the hypotension, however this is her baseline.  Patient states that she experiences hypotension and dizziness frequently.  Patient was recently taken off of her hypertensive medication and placed on midodrine as she was becoming too hypotensive during dialysis treatments.  Patient states that currently she has no complaints, she is not dizzy or having any other neurological changes such as numbness, tingling, headache, vision changes, or any focal deficits.  Patient denies any chest pain or shortness of breath. Patient denies syncope, fever, cough, hemoptysis, diaphoresis. She states that she is wanting to go home and follow-up with cardiology and attend her dialysis tomorrow. The patient is in no acute distress.    HPI     Prior to Admission medications  Medication Sig Start Date End Date Taking? Authorizing Provider  aspirin  EC 81 MG tablet Take 81 mg by mouth daily.    [provider]  calcitRIOL  (ROCALTROL ) 0.25 MCG capsule Take 0.25 mcg by mouth every Monday, Wednesday, and Friday.    [provider]  clopidogrel  (PLAVIX ) 75 MG tablet TAKE 1 TABLET BY MOUTH EVERY DAY 04/09/24   Gretta Lonni PARAS, MD  magnesium  oxide (MAG-OX) 400 MG tablet Take 400 mg by mouth 2 (two) times daily.     [provider]  midodrine (PROAMATINE) 5 MG tablet Take 5 mg by mouth 3 (three) times  a week. 03/17/24   [provider]  omeprazole (PRILOSEC) 20 MG capsule Take 20 mg by mouth daily.    [provider]  predniSONE  (DELTASONE ) 2.5 MG tablet Take 2.5 mg by mouth daily.    [provider]  sevelamer  carbonate (RENVELA ) 800 MG tablet Take 800 mg by mouth daily. 02/21/22   [provider]  simvastatin  (ZOCOR ) 20 MG tablet Take 20 mg by mouth every evening.    [provider]  VENTOLIN  HFA 108 (90 Base) MCG/ACT inhaler Inhale 1 puff into the lungs 3 times/day as needed-between meals & bedtime. 06/21/21   Arrien, Mauricio Daniel, MD    Allergies: Patient has no known allergies.    Review of Systems  Cardiovascular:        Hypotension    Updated Vital Signs BP (!) 98/57   Pulse 80   Temp (!) 97.5 F (36.4 C) (Oral)   Resp 20   Ht 5' (1.524 m)   Wt 58.7 kg   SpO2 97%   BMI 25.27 kg/m   Physical Exam Vitals and nursing note reviewed.  Constitutional:      General: She is not in acute distress.    Appearance: Normal appearance.  HENT:     Head: Normocephalic and atraumatic.  Eyes:     Extraocular Movements: Extraocular movements intact.     Conjunctiva/sclera: Conjunctivae normal.     Pupils: Pupils are equal, round, and reactive to light.  Cardiovascular:     Rate and Rhythm: Normal rate and regular rhythm.     Pulses:  Normal pulses.  Pulmonary:     Effort: Pulmonary effort is normal. No respiratory distress.     Breath sounds: Normal breath sounds.     Comments: Patient has no difficulty speaking in complete sentences.  Abdominal:     General: Abdomen is flat.     Palpations: Abdomen is soft.     Tenderness: There is no abdominal tenderness.  Musculoskeletal:        General: Normal range of motion.     Cervical back: Normal range of motion.     Right lower leg: No edema.     Left lower leg: No edema.  Skin:    General: Skin is warm and dry.     Capillary Refill: Capillary refill takes less than 2 seconds.   Neurological:     General: No focal deficit present.     Mental Status: She is alert. Mental status is at baseline.     Comments: Patient alert and oriented.  Speech clear and appropriate.  No aphasia or dysarthria.   Cranial nerves III through XII intact: Motor strength 5-5 in all extremities with normal tone and no pronator drift.   Sensation intact to light touch in upper and lower extremities bilaterally.  No focal neurologic deficits appreciated.   Psychiatric:        Mood and Affect: Mood normal.     (all labs ordered are listed, but only abnormal results are displayed) Labs Reviewed  COMPREHENSIVE METABOLIC PANEL WITH GFR - Abnormal; Notable for the following components:      Result Value   Chloride 96 (*)    BUN 36 (*)    Creatinine, Ser 7.93 (*)    Calcium  10.4 (*)    Total Protein 8.6 (*)    GFR, Estimated 5 (*)    All other components within normal limits  CBC - Abnormal; Notable for the following components:   Hemoglobin 15.7 (*)    HCT 49.2 (*)    MCV 107.9 (*)    MCH 34.4 (*)    RDW 16.0 (*)    All other components within normal limits  PROTIME-INR - Abnormal; Notable for the following components:   Prothrombin Time 16.0 (*)    All other components within normal limits  CBG MONITORING, ED    EKG: None  Radiology: No results found.   Procedures   Medications Ordered in the ED - No data to display                                Medical Decision Making Amount and/or Complexity of Data Reviewed Labs: ordered. Decision-making details documented in ED Course.   Patient presents to the ED for: Hypotension This involves an extensive number of treatment options Differential diagnosis includes: Cardiac etiology Pulmonary etiology Infectious etiology Metabolic etiology Co-morbid conditions: Stroke, GERD, CKD with dialysis  Additional history/records obtained and reviewed: Additional history obtained from  family - family present at bedside as  supportive, good historians  External records from outside source obtained and reviewed including cardiology records and future orders for echo and heart monitor.  Clinical Course as of 04/10/24 2246  Fri Apr 10, 2024  1652 Temp(!): 97.5 F (36.4 C) Afebrile, vital stable, patient in no acute distress. [ML]  1653 Comprehensive metabolic panel(!) Patient baseline - creatinine 7.93 will receive dialysis tomorrow as scheduled [ML]  1653 CBC(!) Per patient baseline [ML]  2235 ED EKG Sinus rhythm with  PVC [ML]    Clinical Course User Index [ML] Willma Duwaine CROME, [place]    Data Reviewed / Actions Taken: Labs ordered/reviewed with my independent interpretation in ED course above. EKG ordered/reviewed with my independent interpretation in ED course above. The patient was kept on continuous cardiac monitoring during the ED stay. Key findings for the patient were reviewed with the attending physician  Test Considered/Diagnostic tools:  Additional diagnostic testing, such as further laboratory and imaging studies were considered however based on the patients presenting symptoms and initial clinical assessment deemed not necessary at this time.  ED Course / Reassessments: Problem List: Hypotension 69 year old female presented for hypotension. Initial assessment included history, physical exam, and review of prior medical records  Initial vital signs were reviewed and presenting blood pressure upon arrival to the ED was 104/63.  Physical examination was reassuring, without evidence of respiratory distress, hemodynamic instability, or focal neurological deficits.  Diagnostic evaluation included ECG, which showed no acute ischemic changes. Additional testing included laboratory studies which revealed no acute abnormalities.  Based on the patient's history, examination, and diagnostic workup, there is low clinical suspicion for acute coronary syndrome, pulmonary embolism, aortic dissection, or any  neurological emergency.  Patient's hypotension likely related to ongoing hypotensive episodes which are being closely monitored by cardiology.  Patient was monitored in the emergency department and no clinical deterioration was observed. Laboratory  results were reviewed with the patient and during shared decision making with patient and family members present, patient was asked if she would like to receive inpatient treatment for echocardiogram ordered by cardiologist and dialysis treatment given hypotension.  Patient states that she would like to go home and would like to follow-up with cardiology as scheduled.  Patient states that she feels her baseline, she is no longer dizzy, and feels comfortable with discharge.  Family members who are present for visit also share and comfortability.  Orthostatics and ambulatory pulse ox were assessed and patient did not have any acute events of hypotension, hypoxia, or tachycardia.  Patient is appropriate for discharge home at this time. Return precautions were discussed including worsening hypotension, chest pain, shortness of breath, syncope, palpitations, or new symptoms.   Serial reassessments performed: Yes    Disposition: Disposition: Discharge with close follow-up with cardiology for further evaluation and care. Rationale for disposition: Stable for discharge. The disposition plan and rationale were discussed with the patient at the bedside, all questions were addressed, and the patient demonstrated understanding.  This note was produced using Electronics Engineer. While I have reviewed and verified all clinical information, transcription errors may remain.      Final diagnoses:  Hypotension, unspecified hypotension type    ED Discharge Orders     None          Willma Duwaine CROME, [place] 04/10/24 2249    Ula Prentice SAUNDERS, MD 04/10/24 2317  "

## 2024-04-10 NOTE — Discharge Instructions (Addendum)
 Thank you for visiting the Emergency Department today. It was a pleasure to be part of your healthcare team.   Your were seen today for low blood pressure  As discussed, continue to monitor blood pressure at home, rest, hydrate, resume diet as tolerated, and attend your dialysis appointment in the morning.  It is important to watch for warning signs such as worsening low blood pressure, fever, trouble breathing, chest pain, or any neurological changes. If any of these happen, return to the Emergency Department or call 911.  Thank you for trusting us  with your health.

## 2024-04-10 NOTE — Progress Notes (Unsigned)
 Enrolled for [employer] to mail a ZIO XT long term holter monitor to the patients address on file.

## 2024-04-10 NOTE — ED Notes (Signed)
 Pt ambulated with pulse ox. O2 saturations between 98-100% for the duration of ambulation. No concerns noted.

## 2024-04-29 ENCOUNTER — Other Ambulatory Visit: Payer: Self-pay | Admitting: Internal Medicine

## 2024-04-29 DIAGNOSIS — Z1231 Encounter for screening mammogram for malignant neoplasm of breast: Secondary | ICD-10-CM

## 2024-05-01 ENCOUNTER — Encounter

## 2024-05-01 DIAGNOSIS — Z1231 Encounter for screening mammogram for malignant neoplasm of breast: Secondary | ICD-10-CM

## 2024-05-13 ENCOUNTER — Ambulatory Visit (HOSPITAL_COMMUNITY)

## 2024-06-16 ENCOUNTER — Ambulatory Visit

## 2024-06-16 ENCOUNTER — Encounter (HOSPITAL_COMMUNITY)

## 2024-06-24 ENCOUNTER — Ambulatory Visit: Admitting: Podiatry

## 2024-07-06 ENCOUNTER — Ambulatory Visit
